# Patient Record
Sex: Female | Born: 1940 | Race: White | Hispanic: No | Marital: Married | Smoking: Former smoker
Health system: Southern US, Community
[De-identification: ages and names within clinical notes are randomized; demographics above are authoritative.]

## PROBLEM LIST (undated history)

## (undated) DIAGNOSIS — I469 Cardiac arrest, cause unspecified: Secondary | ICD-10-CM

## (undated) DIAGNOSIS — I5022 Chronic systolic (congestive) heart failure: Secondary | ICD-10-CM

## (undated) DIAGNOSIS — D649 Anemia, unspecified: Secondary | ICD-10-CM

## (undated) DIAGNOSIS — I499 Cardiac arrhythmia, unspecified: Secondary | ICD-10-CM

## (undated) DIAGNOSIS — I2089 Other forms of angina pectoris: Secondary | ICD-10-CM

## (undated) DIAGNOSIS — E119 Type 2 diabetes mellitus without complications: Secondary | ICD-10-CM

## (undated) DIAGNOSIS — E1142 Type 2 diabetes mellitus with diabetic polyneuropathy: Secondary | ICD-10-CM

## (undated) DIAGNOSIS — I208 Other forms of angina pectoris: Secondary | ICD-10-CM

## (undated) DIAGNOSIS — I2510 Atherosclerotic heart disease of native coronary artery without angina pectoris: Secondary | ICD-10-CM

## (undated) DIAGNOSIS — I10 Essential (primary) hypertension: Secondary | ICD-10-CM

## (undated) DIAGNOSIS — I219 Acute myocardial infarction, unspecified: Secondary | ICD-10-CM

## (undated) DIAGNOSIS — I739 Peripheral vascular disease, unspecified: Secondary | ICD-10-CM

## (undated) DIAGNOSIS — R413 Other amnesia: Secondary | ICD-10-CM

## (undated) DIAGNOSIS — M1990 Unspecified osteoarthritis, unspecified site: Secondary | ICD-10-CM

## (undated) DIAGNOSIS — K922 Gastrointestinal hemorrhage, unspecified: Secondary | ICD-10-CM

## (undated) DIAGNOSIS — J449 Chronic obstructive pulmonary disease, unspecified: Secondary | ICD-10-CM

## (undated) DIAGNOSIS — Z952 Presence of prosthetic heart valve: Secondary | ICD-10-CM

## (undated) DIAGNOSIS — M797 Fibromyalgia: Secondary | ICD-10-CM

## (undated) DIAGNOSIS — E7800 Pure hypercholesterolemia, unspecified: Secondary | ICD-10-CM

## (undated) HISTORY — PX: ANGIOPLASTY: SHX39

## (undated) HISTORY — PX: CORONARY ARTERY BYPASS GRAFT: SHX141

## (undated) HISTORY — DX: Unspecified osteoarthritis, unspecified site: M19.90

## (undated) HISTORY — DX: Other forms of angina pectoris: I20.89

## (undated) HISTORY — PX: CHOLECYSTECTOMY: SHX55

## (undated) HISTORY — PX: OTHER SURGICAL HISTORY: SHX169

## (undated) HISTORY — PX: AORTIC VALVE REPLACEMENT: SHX41

## (undated) HISTORY — DX: Type 2 diabetes mellitus with diabetic polyneuropathy: E11.42

## (undated) HISTORY — DX: Anemia, unspecified: D64.9

## (undated) HISTORY — DX: Cardiac arrhythmia, unspecified: I49.9

## (undated) HISTORY — DX: Fibromyalgia: M79.7

## (undated) HISTORY — DX: Pure hypercholesterolemia, unspecified: E78.00

## (undated) HISTORY — PX: TOENAIL AVULSION: SHX1074

## (undated) HISTORY — DX: Other amnesia: R41.3

## (undated) HISTORY — PX: BYPASS GRAFT: SHX909

## (undated) HISTORY — DX: Acute myocardial infarction, unspecified: I21.9

## (undated) HISTORY — DX: Peripheral vascular disease, unspecified: I73.9

## (undated) HISTORY — DX: Essential (primary) hypertension: I10

## (undated) HISTORY — DX: Other forms of angina pectoris: I20.8

---

## 1989-01-18 HISTORY — PX: CARDIAC VALVE REPLACEMENT: SHX585

## 1997-06-27 ENCOUNTER — Encounter (HOSPITAL_COMMUNITY): Admission: RE | Admit: 1997-06-27 | Discharge: 1997-09-25 | Payer: Self-pay | Admitting: Cardiology

## 1997-08-27 ENCOUNTER — Ambulatory Visit (HOSPITAL_BASED_OUTPATIENT_CLINIC_OR_DEPARTMENT_OTHER): Admission: RE | Admit: 1997-08-27 | Discharge: 1997-08-27 | Payer: Self-pay | Admitting: Orthopaedic Surgery

## 1997-10-18 ENCOUNTER — Encounter (HOSPITAL_COMMUNITY): Admission: RE | Admit: 1997-10-18 | Discharge: 1998-01-16 | Payer: Self-pay | Admitting: Cardiology

## 1997-11-18 ENCOUNTER — Encounter: Payer: Self-pay | Admitting: Emergency Medicine

## 1997-11-18 ENCOUNTER — Emergency Department (HOSPITAL_COMMUNITY): Admission: EM | Admit: 1997-11-18 | Discharge: 1997-11-18 | Payer: Self-pay | Admitting: Emergency Medicine

## 1998-01-01 ENCOUNTER — Encounter: Payer: Self-pay | Admitting: Internal Medicine

## 1998-01-01 ENCOUNTER — Ambulatory Visit (HOSPITAL_COMMUNITY): Admission: RE | Admit: 1998-01-01 | Discharge: 1998-01-01 | Payer: Self-pay | Admitting: Internal Medicine

## 1998-01-06 ENCOUNTER — Ambulatory Visit (HOSPITAL_COMMUNITY): Admission: RE | Admit: 1998-01-06 | Discharge: 1998-01-06 | Payer: Self-pay

## 1998-01-24 ENCOUNTER — Encounter (HOSPITAL_COMMUNITY): Admission: RE | Admit: 1998-01-24 | Discharge: 1998-04-24 | Payer: Self-pay | Admitting: Cardiology

## 1998-07-23 ENCOUNTER — Encounter (HOSPITAL_COMMUNITY): Admission: RE | Admit: 1998-07-23 | Discharge: 1998-08-18 | Payer: Self-pay | Admitting: Cardiology

## 1998-08-19 ENCOUNTER — Encounter (HOSPITAL_COMMUNITY): Admission: RE | Admit: 1998-08-19 | Discharge: 1998-11-17 | Payer: Self-pay | Admitting: Cardiology

## 1999-01-07 ENCOUNTER — Other Ambulatory Visit: Admission: RE | Admit: 1999-01-07 | Discharge: 1999-01-07 | Payer: Self-pay | Admitting: Obstetrics and Gynecology

## 1999-03-11 ENCOUNTER — Encounter: Payer: Self-pay | Admitting: Obstetrics and Gynecology

## 1999-03-11 ENCOUNTER — Encounter: Admission: RE | Admit: 1999-03-11 | Discharge: 1999-03-11 | Payer: Self-pay | Admitting: Obstetrics and Gynecology

## 1999-12-07 ENCOUNTER — Ambulatory Visit (HOSPITAL_COMMUNITY): Admission: RE | Admit: 1999-12-07 | Discharge: 1999-12-07 | Payer: Self-pay | Admitting: Obstetrics and Gynecology

## 1999-12-07 ENCOUNTER — Encounter: Payer: Self-pay | Admitting: Obstetrics and Gynecology

## 1999-12-15 ENCOUNTER — Other Ambulatory Visit: Admission: RE | Admit: 1999-12-15 | Discharge: 1999-12-15 | Payer: Self-pay | Admitting: Obstetrics and Gynecology

## 2000-12-12 ENCOUNTER — Encounter: Payer: Self-pay | Admitting: Obstetrics and Gynecology

## 2000-12-12 ENCOUNTER — Ambulatory Visit (HOSPITAL_COMMUNITY): Admission: RE | Admit: 2000-12-12 | Discharge: 2000-12-12 | Payer: Self-pay | Admitting: Obstetrics and Gynecology

## 2000-12-20 ENCOUNTER — Other Ambulatory Visit: Admission: RE | Admit: 2000-12-20 | Discharge: 2000-12-20 | Payer: Self-pay | Admitting: Obstetrics and Gynecology

## 2001-01-18 HISTORY — PX: OTHER SURGICAL HISTORY: SHX169

## 2001-07-16 ENCOUNTER — Inpatient Hospital Stay (HOSPITAL_COMMUNITY): Admission: RE | Admit: 2001-07-16 | Discharge: 2001-07-24 | Payer: Self-pay | Admitting: Vascular Surgery

## 2001-07-16 ENCOUNTER — Encounter: Payer: Self-pay | Admitting: Vascular Surgery

## 2001-07-18 ENCOUNTER — Encounter: Payer: Self-pay | Admitting: Vascular Surgery

## 2001-07-20 ENCOUNTER — Encounter: Payer: Self-pay | Admitting: Vascular Surgery

## 2001-12-21 ENCOUNTER — Encounter (INDEPENDENT_AMBULATORY_CARE_PROVIDER_SITE_OTHER): Payer: Self-pay | Admitting: Cardiology

## 2001-12-21 ENCOUNTER — Ambulatory Visit: Admission: RE | Admit: 2001-12-21 | Discharge: 2001-12-21 | Payer: Self-pay | Admitting: Cardiology

## 2002-02-06 ENCOUNTER — Other Ambulatory Visit: Admission: RE | Admit: 2002-02-06 | Discharge: 2002-02-06 | Payer: Self-pay | Admitting: Obstetrics and Gynecology

## 2002-02-08 ENCOUNTER — Ambulatory Visit (HOSPITAL_COMMUNITY): Admission: RE | Admit: 2002-02-08 | Discharge: 2002-02-08 | Payer: Self-pay | Admitting: Obstetrics and Gynecology

## 2002-02-08 ENCOUNTER — Encounter: Payer: Self-pay | Admitting: Obstetrics and Gynecology

## 2003-02-22 ENCOUNTER — Ambulatory Visit (HOSPITAL_COMMUNITY): Admission: RE | Admit: 2003-02-22 | Discharge: 2003-02-22 | Payer: Self-pay | Admitting: Internal Medicine

## 2003-02-28 ENCOUNTER — Other Ambulatory Visit: Admission: RE | Admit: 2003-02-28 | Discharge: 2003-02-28 | Payer: Self-pay | Admitting: Obstetrics and Gynecology

## 2004-02-24 ENCOUNTER — Ambulatory Visit (HOSPITAL_COMMUNITY): Admission: RE | Admit: 2004-02-24 | Discharge: 2004-02-24 | Payer: Self-pay | Admitting: Obstetrics and Gynecology

## 2004-04-28 ENCOUNTER — Inpatient Hospital Stay (HOSPITAL_COMMUNITY): Admission: AD | Admit: 2004-04-28 | Discharge: 2004-05-13 | Payer: Self-pay | Admitting: Cardiology

## 2004-06-17 ENCOUNTER — Encounter (HOSPITAL_COMMUNITY): Admission: RE | Admit: 2004-06-17 | Discharge: 2004-09-15 | Payer: Self-pay | Admitting: Cardiology

## 2004-06-17 ENCOUNTER — Encounter: Admission: RE | Admit: 2004-06-17 | Discharge: 2004-06-17 | Payer: Self-pay | Admitting: Cardiology

## 2005-01-12 ENCOUNTER — Ambulatory Visit: Admission: RE | Admit: 2005-01-12 | Discharge: 2005-01-12 | Payer: Self-pay | Admitting: Internal Medicine

## 2005-03-04 ENCOUNTER — Ambulatory Visit (HOSPITAL_COMMUNITY): Admission: RE | Admit: 2005-03-04 | Discharge: 2005-03-04 | Payer: Self-pay | Admitting: *Deleted

## 2005-03-08 ENCOUNTER — Other Ambulatory Visit: Admission: RE | Admit: 2005-03-08 | Discharge: 2005-03-08 | Payer: Self-pay | Admitting: Obstetrics & Gynecology

## 2005-04-07 ENCOUNTER — Ambulatory Visit (HOSPITAL_COMMUNITY): Admission: RE | Admit: 2005-04-07 | Discharge: 2005-04-07 | Payer: Self-pay | Admitting: Obstetrics & Gynecology

## 2006-02-24 ENCOUNTER — Encounter: Admission: RE | Admit: 2006-02-24 | Discharge: 2006-02-24 | Payer: Self-pay | Admitting: Cardiology

## 2006-03-02 ENCOUNTER — Ambulatory Visit (HOSPITAL_COMMUNITY): Admission: RE | Admit: 2006-03-02 | Discharge: 2006-03-02 | Payer: Self-pay | Admitting: Cardiology

## 2006-07-20 ENCOUNTER — Ambulatory Visit: Payer: Self-pay | Admitting: Vascular Surgery

## 2007-02-10 ENCOUNTER — Ambulatory Visit: Payer: Self-pay | Admitting: Vascular Surgery

## 2007-04-14 ENCOUNTER — Encounter (INDEPENDENT_AMBULATORY_CARE_PROVIDER_SITE_OTHER): Payer: Self-pay | Admitting: *Deleted

## 2007-04-14 ENCOUNTER — Ambulatory Visit (HOSPITAL_COMMUNITY): Admission: RE | Admit: 2007-04-14 | Discharge: 2007-04-14 | Payer: Self-pay | Admitting: *Deleted

## 2007-07-31 ENCOUNTER — Ambulatory Visit: Payer: Self-pay | Admitting: Vascular Surgery

## 2008-03-11 ENCOUNTER — Ambulatory Visit: Payer: Self-pay | Admitting: Vascular Surgery

## 2008-05-08 ENCOUNTER — Ambulatory Visit (HOSPITAL_COMMUNITY): Admission: RE | Admit: 2008-05-08 | Discharge: 2008-05-08 | Payer: Self-pay | Admitting: Obstetrics & Gynecology

## 2008-09-03 ENCOUNTER — Inpatient Hospital Stay (HOSPITAL_COMMUNITY): Admission: EM | Admit: 2008-09-03 | Discharge: 2008-09-07 | Payer: Self-pay | Admitting: Emergency Medicine

## 2008-09-06 ENCOUNTER — Encounter (INDEPENDENT_AMBULATORY_CARE_PROVIDER_SITE_OTHER): Payer: Self-pay | Admitting: Cardiology

## 2008-09-17 ENCOUNTER — Encounter: Admission: RE | Admit: 2008-09-17 | Discharge: 2008-09-17 | Payer: Self-pay | Admitting: Orthopaedic Surgery

## 2008-09-27 ENCOUNTER — Ambulatory Visit: Payer: Self-pay | Admitting: Vascular Surgery

## 2008-12-30 ENCOUNTER — Inpatient Hospital Stay (HOSPITAL_COMMUNITY): Admission: EM | Admit: 2008-12-30 | Discharge: 2009-01-03 | Payer: Self-pay | Admitting: Emergency Medicine

## 2008-12-31 ENCOUNTER — Encounter (INDEPENDENT_AMBULATORY_CARE_PROVIDER_SITE_OTHER): Payer: Self-pay | Admitting: Internal Medicine

## 2008-12-31 ENCOUNTER — Ambulatory Visit: Payer: Self-pay | Admitting: Vascular Surgery

## 2009-01-02 ENCOUNTER — Encounter (INDEPENDENT_AMBULATORY_CARE_PROVIDER_SITE_OTHER): Payer: Self-pay | Admitting: Internal Medicine

## 2009-05-20 ENCOUNTER — Ambulatory Visit: Payer: Self-pay | Admitting: Vascular Surgery

## 2009-12-17 ENCOUNTER — Encounter (INDEPENDENT_AMBULATORY_CARE_PROVIDER_SITE_OTHER): Payer: Self-pay | Admitting: Internal Medicine

## 2009-12-17 ENCOUNTER — Inpatient Hospital Stay (HOSPITAL_COMMUNITY)
Admission: EM | Admit: 2009-12-17 | Discharge: 2009-12-25 | Payer: Self-pay | Source: Home / Self Care | Attending: Internal Medicine | Admitting: Internal Medicine

## 2010-01-16 ENCOUNTER — Ambulatory Visit: Payer: Self-pay | Admitting: Vascular Surgery

## 2010-03-05 ENCOUNTER — Encounter (INDEPENDENT_AMBULATORY_CARE_PROVIDER_SITE_OTHER): Payer: Medicare Other

## 2010-03-05 DIAGNOSIS — Z48812 Encounter for surgical aftercare following surgery on the circulatory system: Secondary | ICD-10-CM

## 2010-03-05 DIAGNOSIS — I70229 Atherosclerosis of native arteries of extremities with rest pain, unspecified extremity: Secondary | ICD-10-CM

## 2010-03-17 NOTE — Procedures (Unsigned)
BYPASS GRAFT EVALUATION  INDICATION:  Right lower extremity bypass graft.  HISTORY: Diabetes:  Yes. Cardiac:  CABG. Hypertension:  Yes. Smoking:  Yes. Previous Surgery:  Right fem-pop bypass graft on 07/18/2001.  SINGLE LEVEL ARTERIAL EXAM                              RIGHT              LEFT Brachial:                    141                136 Anterior tibial:             164                83 Posterior tibial: Peroneal: Ankle/brachial index:        1.16               0.90  PREVIOUS ABI:  Date: 05/20/2009  RIGHT:  1.06  LEFT:  0.71  LOWER EXTREMITY BYPASS GRAFT DUPLEX EXAM:  DUPLEX:  Biphasic Doppler waveforms noted throughout the right lower extremity bypass graft with no increase in velocities.  IMPRESSION: 1. Patent right femoral-popliteal bypass graft with no evidence of     stenosis. 2. Bilateral ankle brachial indices and Doppler waveforms suggest     normal perfusion of the right lower extremity and mildly decreased     perfusion of the left lower extremity.       ___________________________________________ Larina Earthly, M.D.  CH/MEDQ  D:  03/06/2010  T:  03/06/2010  Job:  [id number]

## 2010-03-31 LAB — BASIC METABOLIC PANEL
BUN: 26 mg/dL — ABNORMAL HIGH (ref 6–23)
BUN: 30 mg/dL — ABNORMAL HIGH (ref 6–23)
BUN: 30 mg/dL — ABNORMAL HIGH (ref 6–23)
BUN: 34 mg/dL — ABNORMAL HIGH (ref 6–23)
BUN: 40 mg/dL — ABNORMAL HIGH (ref 6–23)
Calcium: 8.6 mg/dL (ref 8.4–10.5)
Calcium: 8.9 mg/dL (ref 8.4–10.5)
Calcium: 9.2 mg/dL (ref 8.4–10.5)
Chloride: 100 mEq/L (ref 96–112)
Chloride: 101 mEq/L (ref 96–112)
Creatinine, Ser: 0.85 mg/dL (ref 0.4–1.2)
Creatinine, Ser: 0.9 mg/dL (ref 0.4–1.2)
Creatinine, Ser: 0.95 mg/dL (ref 0.4–1.2)
Creatinine, Ser: 1.06 mg/dL (ref 0.4–1.2)
GFR calc Af Amer: >60 mL/min (ref >60)
GFR calc Af Amer: >60 mL/min (ref >60)
GFR calc Af Amer: >60 mL/min (ref >60)
GFR calc Af Amer: >60 mL/min (ref >60)
GFR calc non Af Amer: 52 mL/min — ABNORMAL LOW (ref >60)
GFR calc non Af Amer: 58 mL/min — ABNORMAL LOW (ref >60)
GFR calc non Af Amer: >60 mL/min (ref >60)
Glucose, Bld: 271 mg/dL — ABNORMAL HIGH (ref 70–99)
Potassium: 3.9 mEq/L (ref 3.5–5.1)

## 2010-03-31 LAB — CBC
HCT: 32.1 % — ABNORMAL LOW (ref 36.0–46.0)
HCT: 38 % (ref 36.0–46.0)
Hemoglobin: 10.1 g/dL — ABNORMAL LOW (ref 12.0–15.0)
MCH: 29.8 pg (ref 26.0–34.0)
MCH: 30.4 pg (ref 26.0–34.0)
MCH: 30.6 pg (ref 26.0–34.0)
MCHC: 31.5 g/dL (ref 30.0–36.0)
MCHC: 31.8 g/dL (ref 30.0–36.0)
MCHC: 32.8 g/dL (ref 30.0–36.0)
MCHC: 32.8 g/dL (ref 30.0–36.0)
MCV: 93.1 fL (ref 78.0–100.0)
MCV: 93.7 fL (ref 78.0–100.0)
MCV: 94.4 fL (ref 78.0–100.0)
MCV: 94.7 fL (ref 78.0–100.0)
Platelets: 249 10*3/uL (ref 150–400)
Platelets: 287 10*3/uL (ref 150–400)
Platelets: 298 10*3/uL (ref 150–400)
Platelets: 307 10*3/uL (ref 150–400)
Platelets: 335 10*3/uL (ref 150–400)
Platelets: 335 10*3/uL (ref 150–400)
RBC: 3.21 MIL/uL — ABNORMAL LOW (ref 3.87–5.11)
RBC: 3.45 MIL/uL — ABNORMAL LOW (ref 3.87–5.11)
RDW: 12.7 % (ref 11.5–15.5)
RDW: 12.9 % (ref 11.5–15.5)
RDW: 13 % (ref 11.5–15.5)
RDW: 13.1 % (ref 11.5–15.5)
RDW: 13.1 % (ref 11.5–15.5)
RDW: 13.2 % (ref 11.5–15.5)
RDW: 13.4 % (ref 11.5–15.5)
RDW: 13.4 % (ref 11.5–15.5)
WBC: 6.6 10*3/uL (ref 4.0–10.5)
WBC: 6.6 10*3/uL (ref 4.0–10.5)
WBC: 6.6 10*3/uL (ref 4.0–10.5)
WBC: 7.6 10*3/uL (ref 4.0–10.5)
WBC: 9.7 10*3/uL (ref 4.0–10.5)

## 2010-03-31 LAB — GLUCOSE, CAPILLARY
Glucose-Capillary: 100 mg/dL — ABNORMAL HIGH (ref 70–99)
Glucose-Capillary: 100 mg/dL — ABNORMAL HIGH (ref 70–99)
Glucose-Capillary: 115 mg/dL — ABNORMAL HIGH (ref 70–99)
Glucose-Capillary: 122 mg/dL — ABNORMAL HIGH (ref 70–99)
Glucose-Capillary: 137 mg/dL — ABNORMAL HIGH (ref 70–99)
Glucose-Capillary: 161 mg/dL — ABNORMAL HIGH (ref 70–99)
Glucose-Capillary: 162 mg/dL — ABNORMAL HIGH (ref 70–99)
Glucose-Capillary: 169 mg/dL — ABNORMAL HIGH (ref 70–99)
Glucose-Capillary: 191 mg/dL — ABNORMAL HIGH (ref 70–99)
Glucose-Capillary: 195 mg/dL — ABNORMAL HIGH (ref 70–99)
Glucose-Capillary: 197 mg/dL — ABNORMAL HIGH (ref 70–99)
Glucose-Capillary: 200 mg/dL — ABNORMAL HIGH (ref 70–99)
Glucose-Capillary: 351 mg/dL — ABNORMAL HIGH (ref 70–99)
Glucose-Capillary: 78 mg/dL (ref 70–99)
Glucose-Capillary: 84 mg/dL (ref 70–99)

## 2010-03-31 LAB — HEPATIC FUNCTION PANEL
AST: 38 U/L — ABNORMAL HIGH (ref 0–37)
Bilirubin, Direct: 0.2 mg/dL (ref 0.0–0.3)
Indirect Bilirubin: 0.7 mg/dL (ref 0.3–0.9)
Total Bilirubin: 0.9 mg/dL (ref 0.3–1.2)

## 2010-03-31 LAB — HEPARIN LEVEL (UNFRACTIONATED)
Heparin Unfractionated: 0.16 IU/mL — ABNORMAL LOW (ref 0.30–0.70)
Heparin Unfractionated: 0.34 IU/mL (ref 0.30–0.70)
Heparin Unfractionated: 0.52 IU/mL (ref 0.30–0.70)
Heparin Unfractionated: 0.56 IU/mL (ref 0.30–0.70)
Heparin Unfractionated: <0.1 IU/mL — ABNORMAL LOW (ref 0.30–0.70)

## 2010-03-31 LAB — PROTIME-INR
INR: 1.03 (ref 0.00–1.49)
INR: 1.15 (ref 0.00–1.49)
INR: 1.21 (ref 0.00–1.49)
INR: 1.39 (ref 0.00–1.49)
INR: 1.87 — ABNORMAL HIGH (ref 0.00–1.49)
INR: 3.24 — ABNORMAL HIGH (ref 0.00–1.49)
INR: 3.6 — ABNORMAL HIGH (ref 0.00–1.49)
Prothrombin Time: 13.7 seconds (ref 11.6–15.2)
Prothrombin Time: 15.5 seconds — ABNORMAL HIGH (ref 11.6–15.2)
Prothrombin Time: 21.7 seconds — ABNORMAL HIGH (ref 11.6–15.2)
Prothrombin Time: 33.1 seconds — ABNORMAL HIGH (ref 11.6–15.2)
Prothrombin Time: 35.9 seconds — ABNORMAL HIGH (ref 11.6–15.2)

## 2010-03-31 LAB — DIFFERENTIAL
Basophils Absolute: 0 10*3/uL (ref 0.0–0.1)
Eosinophils Absolute: 0.2 10*3/uL (ref 0.0–0.7)
Eosinophils Relative: 2 % (ref 0–5)
Lymphocytes Relative: 23 % (ref 12–46)
Lymphs Abs: 2.2 10*3/uL (ref 0.7–4.0)
Neutrophils Relative %: 67 % (ref 43–77)

## 2010-03-31 LAB — BRAIN NATRIURETIC PEPTIDE
Pro B Natriuretic peptide (BNP): 1362 pg/mL — ABNORMAL HIGH (ref 0.0–100.0)
Pro B Natriuretic peptide (BNP): 600 pg/mL — ABNORMAL HIGH (ref 0.0–100.0)
Pro B Natriuretic peptide (BNP): 648 pg/mL — ABNORMAL HIGH (ref 0.0–100.0)

## 2010-03-31 LAB — HEMOCCULT GUIAC POC 1CARD (OFFICE): Fecal Occult Bld: NEGATIVE

## 2010-03-31 LAB — TSH: TSH: 0.71 u[IU]/mL (ref 0.350–4.500)

## 2010-04-01 LAB — DIFFERENTIAL
Basophils Relative: 0 % (ref 0–1)
Eosinophils Absolute: 0.2 10*3/uL (ref 0.0–0.7)
Eosinophils Relative: 1 % (ref 0–5)
Lymphocytes Relative: 9 % — ABNORMAL LOW (ref 12–46)
Neutrophils Relative %: 85 % — ABNORMAL HIGH (ref 43–77)

## 2010-04-01 LAB — CK TOTAL AND CKMB (NOT AT ARMC): Total CK: 337 U/L — ABNORMAL HIGH (ref 7–177)

## 2010-04-01 LAB — GLUCOSE, CAPILLARY
Glucose-Capillary: 248 mg/dL — ABNORMAL HIGH (ref 70–99)
Glucose-Capillary: 260 mg/dL — ABNORMAL HIGH (ref 70–99)

## 2010-04-01 LAB — URINALYSIS, ROUTINE W REFLEX MICROSCOPIC
Bilirubin Urine: NEGATIVE
Ketones, ur: NEGATIVE mg/dL
Nitrite: NEGATIVE
Urobilinogen, UA: 0.2 mg/dL (ref 0.0–1.0)

## 2010-04-01 LAB — CULTURE, BLOOD (ROUTINE X 2)
Culture  Setup Time: 201112010057
Culture  Setup Time: 201112010058

## 2010-04-01 LAB — BASIC METABOLIC PANEL
Calcium: 9 mg/dL (ref 8.4–10.5)
Creatinine, Ser: 1 mg/dL (ref 0.4–1.2)
GFR calc Af Amer: >60 mL/min (ref >60)
GFR calc non Af Amer: 55 mL/min — ABNORMAL LOW (ref >60)
Sodium: 141 mEq/L (ref 135–145)

## 2010-04-01 LAB — CARDIAC PANEL(CRET KIN+CKTOT+MB+TROPI)
CK, MB: 5.5 ng/mL — ABNORMAL HIGH (ref 0.3–4.0)
CK, MB: 7.6 ng/mL (ref 0.3–4.0)
Relative Index: 2.1 (ref 0.0–2.5)
Total CK: 264 U/L — ABNORMAL HIGH (ref 7–177)
Troponin I: 1.36 ng/mL (ref 0.00–0.06)
Troponin I: 1.6 ng/mL (ref 0.00–0.06)

## 2010-04-01 LAB — POCT CARDIAC MARKERS: Myoglobin, poc: 57.3 ng/mL (ref 12–200)

## 2010-04-01 LAB — LIPID PANEL
Cholesterol: 131 mg/dL (ref 0–200)
LDL Cholesterol: 64 mg/dL (ref 0–99)
Triglycerides: 136 mg/dL (ref <150)
VLDL: 27 mg/dL (ref 0–40)

## 2010-04-01 LAB — HEMOGLOBIN A1C
Hgb A1c MFr Bld: 6.3 % — ABNORMAL HIGH (ref <5.7)
Hgb A1c MFr Bld: 6.4 % — ABNORMAL HIGH (ref <5.7)
Mean Plasma Glucose: 137 mg/dL — ABNORMAL HIGH (ref <117)

## 2010-04-01 LAB — CBC
Hemoglobin: 11.6 g/dL — ABNORMAL LOW (ref 12.0–15.0)
MCHC: 31 g/dL (ref 30.0–36.0)
Platelets: 346 10*3/uL (ref 150–400)
RBC: 3.84 MIL/uL — ABNORMAL LOW (ref 3.87–5.11)

## 2010-04-01 LAB — MRSA PCR SCREENING: MRSA by PCR: NEGATIVE

## 2010-04-01 LAB — APTT: aPTT: 37 seconds (ref 24–37)

## 2010-04-20 LAB — CBC
HCT: 35.7 % — ABNORMAL LOW (ref 36.0–46.0)
Hemoglobin: 11.8 g/dL — ABNORMAL LOW (ref 12.0–15.0)
MCHC: 32.9 g/dL (ref 30.0–36.0)
Platelets: 311 10*3/uL (ref 150–400)
RDW: 13.1 % (ref 11.5–15.5)

## 2010-04-20 LAB — GLUCOSE, CAPILLARY
Glucose-Capillary: 117 mg/dL — ABNORMAL HIGH (ref 70–99)
Glucose-Capillary: 124 mg/dL — ABNORMAL HIGH (ref 70–99)
Glucose-Capillary: 138 mg/dL — ABNORMAL HIGH (ref 70–99)
Glucose-Capillary: 161 mg/dL — ABNORMAL HIGH (ref 70–99)

## 2010-04-20 LAB — SEDIMENTATION RATE: Sed Rate: 9 mm/hr (ref 0–22)

## 2010-04-20 LAB — PROTIME-INR: INR: 1.92 — ABNORMAL HIGH (ref 0.00–1.49)

## 2010-04-21 LAB — GLUCOSE, CAPILLARY
Glucose-Capillary: 117 mg/dL — ABNORMAL HIGH (ref 70–99)
Glucose-Capillary: 131 mg/dL — ABNORMAL HIGH (ref 70–99)
Glucose-Capillary: 134 mg/dL — ABNORMAL HIGH (ref 70–99)
Glucose-Capillary: 168 mg/dL — ABNORMAL HIGH (ref 70–99)
Glucose-Capillary: 199 mg/dL — ABNORMAL HIGH (ref 70–99)
Glucose-Capillary: 82 mg/dL (ref 70–99)
Glucose-Capillary: 97 mg/dL (ref 70–99)

## 2010-04-21 LAB — BASIC METABOLIC PANEL
BUN: 17 mg/dL (ref 6–23)
Calcium: 9 mg/dL (ref 8.4–10.5)
Creatinine, Ser: 0.83 mg/dL (ref 0.4–1.2)
GFR calc non Af Amer: >60 mL/min (ref >60)
Glucose, Bld: 136 mg/dL — ABNORMAL HIGH (ref 70–99)

## 2010-04-21 LAB — LIPID PANEL
Cholesterol: 132 mg/dL (ref 0–200)
LDL Cholesterol: 69 mg/dL (ref 0–99)
Total CHOL/HDL Ratio: 3 RATIO
Triglycerides: 94 mg/dL (ref <150)
VLDL: 19 mg/dL (ref 0–40)

## 2010-04-21 LAB — COMPREHENSIVE METABOLIC PANEL
Albumin: 3.8 g/dL (ref 3.5–5.2)
Alkaline Phosphatase: 24 U/L — ABNORMAL LOW (ref 39–117)
BUN: 20 mg/dL (ref 6–23)
Creatinine, Ser: 0.88 mg/dL (ref 0.4–1.2)
Glucose, Bld: 89 mg/dL (ref 70–99)
Potassium: 3.6 mEq/L (ref 3.5–5.1)
Total Protein: 7 g/dL (ref 6.0–8.3)

## 2010-04-21 LAB — CBC
HCT: 34.4 % — ABNORMAL LOW (ref 36.0–46.0)
Hemoglobin: 10.4 g/dL — ABNORMAL LOW (ref 12.0–15.0)
Hemoglobin: 11.4 g/dL — ABNORMAL LOW (ref 12.0–15.0)
MCHC: 33 g/dL (ref 30.0–36.0)
MCHC: 33.3 g/dL (ref 30.0–36.0)
MCV: 94.7 fL (ref 78.0–100.0)
Platelets: 312 10*3/uL (ref 150–400)
RBC: 3.35 MIL/uL — ABNORMAL LOW (ref 3.87–5.11)
RDW: 12.9 % (ref 11.5–15.5)
WBC: 6.2 10*3/uL (ref 4.0–10.5)

## 2010-04-21 LAB — URINALYSIS, ROUTINE W REFLEX MICROSCOPIC
Glucose, UA: NEGATIVE mg/dL
Hgb urine dipstick: NEGATIVE
Protein, ur: NEGATIVE mg/dL
Specific Gravity, Urine: 1.015 (ref 1.005–1.030)
pH: 5 (ref 5.0–8.0)

## 2010-04-21 LAB — CARDIAC PANEL(CRET KIN+CKTOT+MB+TROPI)
CK, MB: 1.3 ng/mL (ref 0.3–4.0)
CK, MB: 1.3 ng/mL (ref 0.3–4.0)
Relative Index: INVALID (ref 0.0–2.5)
Total CK: 81 U/L (ref 7–177)
Total CK: 96 U/L (ref 7–177)
Troponin I: 0.02 ng/mL (ref 0.00–0.06)
Troponin I: 0.02 ng/mL (ref 0.00–0.06)

## 2010-04-21 LAB — URINE MICROSCOPIC-ADD ON

## 2010-04-21 LAB — HEMOGLOBIN A1C
Hgb A1c MFr Bld: 5.6 % (ref 4.6–6.1)
Mean Plasma Glucose: 114 mg/dL

## 2010-04-21 LAB — DIFFERENTIAL
Basophils Absolute: 0 10*3/uL (ref 0.0–0.1)
Basophils Relative: 0 % (ref 0–1)
Lymphocytes Relative: 18 % (ref 12–46)
Monocytes Relative: 6 % (ref 3–12)
Neutro Abs: 5.6 10*3/uL (ref 1.7–7.7)
Neutrophils Relative %: 73 % (ref 43–77)

## 2010-04-21 LAB — RETICULOCYTES
RBC.: 3.51 MIL/uL — ABNORMAL LOW (ref 3.87–5.11)
Retic Count, Absolute: 59.7 10*3/uL (ref 19.0–186.0)
Retic Ct Pct: 1.7 % (ref 0.4–3.1)

## 2010-04-21 LAB — CK TOTAL AND CKMB (NOT AT ARMC)
Relative Index: 1.4 (ref 0.0–2.5)
Total CK: 130 U/L (ref 7–177)

## 2010-04-21 LAB — PROTIME-INR
INR: 2.64 — ABNORMAL HIGH (ref 0.00–1.49)
INR: 3 — ABNORMAL HIGH (ref 0.00–1.49)
Prothrombin Time: 21.5 seconds — ABNORMAL HIGH (ref 11.6–15.2)
Prothrombin Time: 28 seconds — ABNORMAL HIGH (ref 11.6–15.2)

## 2010-04-21 LAB — URINE CULTURE

## 2010-04-21 LAB — HOMOCYSTEINE: Homocysteine: 13.8 umol/L (ref 4.0–15.4)

## 2010-04-21 LAB — APTT: aPTT: 48 seconds — ABNORMAL HIGH (ref 24–37)

## 2010-04-21 LAB — TROPONIN I: Troponin I: 0.03 ng/mL (ref 0.00–0.06)

## 2010-04-21 LAB — VITAMIN B12: Vitamin B-12: 722 pg/mL (ref 211–911)

## 2010-04-25 LAB — GLUCOSE, CAPILLARY
Glucose-Capillary: 115 mg/dL — ABNORMAL HIGH (ref 70–99)
Glucose-Capillary: 135 mg/dL — ABNORMAL HIGH (ref 70–99)
Glucose-Capillary: 147 mg/dL — ABNORMAL HIGH (ref 70–99)
Glucose-Capillary: 147 mg/dL — ABNORMAL HIGH (ref 70–99)
Glucose-Capillary: 151 mg/dL — ABNORMAL HIGH (ref 70–99)
Glucose-Capillary: 163 mg/dL — ABNORMAL HIGH (ref 70–99)
Glucose-Capillary: 202 mg/dL — ABNORMAL HIGH (ref 70–99)
Glucose-Capillary: 252 mg/dL — ABNORMAL HIGH (ref 70–99)

## 2010-04-25 LAB — URINALYSIS, ROUTINE W REFLEX MICROSCOPIC
Bilirubin Urine: NEGATIVE
Ketones, ur: 15 mg/dL — AB
Nitrite: NEGATIVE
Protein, ur: NEGATIVE mg/dL
pH: 6 (ref 5.0–8.0)

## 2010-04-25 LAB — PROTIME-INR
INR: 1.4 (ref 0.00–1.49)
INR: 2.2 — ABNORMAL HIGH (ref 0.00–1.49)
INR: 2.3 — ABNORMAL HIGH (ref 0.00–1.49)
Prothrombin Time: 17.4 seconds — ABNORMAL HIGH (ref 11.6–15.2)
Prothrombin Time: 26.8 seconds — ABNORMAL HIGH (ref 11.6–15.2)

## 2010-04-25 LAB — CARDIAC PANEL(CRET KIN+CKTOT+MB+TROPI)
CK, MB: 21.1 ng/mL — ABNORMAL HIGH (ref 0.3–4.0)
CK, MB: 74.4 ng/mL — ABNORMAL HIGH (ref 0.3–4.0)
Relative Index: 4.5 — ABNORMAL HIGH (ref 0.0–2.5)
Total CK: 919 U/L — ABNORMAL HIGH (ref 7–177)
Troponin I: 12.28 ng/mL (ref 0.00–0.06)

## 2010-04-25 LAB — COMPREHENSIVE METABOLIC PANEL
ALT: 32 U/L (ref 0–35)
AST: 76 U/L — ABNORMAL HIGH (ref 0–37)
Albumin: 3.9 g/dL (ref 3.5–5.2)
Alkaline Phosphatase: 31 U/L — ABNORMAL LOW (ref 39–117)
CO2: 25 mEq/L (ref 19–32)
Chloride: 99 mEq/L (ref 96–112)
GFR calc Af Amer: >60 mL/min (ref >60)
Potassium: 4.7 mEq/L (ref 3.5–5.1)
Sodium: 134 mEq/L — ABNORMAL LOW (ref 135–145)
Total Bilirubin: 0.9 mg/dL (ref 0.3–1.2)

## 2010-04-25 LAB — BASIC METABOLIC PANEL
BUN: 19 mg/dL (ref 6–23)
BUN: 20 mg/dL (ref 6–23)
BUN: 22 mg/dL (ref 6–23)
CO2: 29 mEq/L (ref 19–32)
CO2: 31 mEq/L (ref 19–32)
Calcium: 8.5 mg/dL (ref 8.4–10.5)
Calcium: 8.5 mg/dL (ref 8.4–10.5)
Calcium: 8.8 mg/dL (ref 8.4–10.5)
Chloride: 105 mEq/L (ref 96–112)
Creatinine, Ser: 0.85 mg/dL (ref 0.4–1.2)
Creatinine, Ser: 0.85 mg/dL (ref 0.4–1.2)
Creatinine, Ser: 0.88 mg/dL (ref 0.4–1.2)
GFR calc Af Amer: >60 mL/min (ref >60)
GFR calc Af Amer: >60 mL/min (ref >60)
GFR calc Af Amer: >60 mL/min (ref >60)
GFR calc non Af Amer: >60 mL/min (ref >60)
GFR calc non Af Amer: >60 mL/min (ref >60)
Glucose, Bld: 140 mg/dL — ABNORMAL HIGH (ref 70–99)
Potassium: 3.9 mEq/L (ref 3.5–5.1)
Sodium: 142 mEq/L (ref 135–145)

## 2010-04-25 LAB — MAGNESIUM: Magnesium: 1.9 mg/dL (ref 1.5–2.5)

## 2010-04-25 LAB — LIPID PANEL
LDL Cholesterol: 200 mg/dL — ABNORMAL HIGH (ref 0–99)
LDL Cholesterol: 244 mg/dL — ABNORMAL HIGH (ref 0–99)
Triglycerides: 287 mg/dL — ABNORMAL HIGH (ref <150)
VLDL: 38 mg/dL (ref 0–40)

## 2010-04-25 LAB — CBC
HCT: 36.9 % (ref 36.0–46.0)
MCHC: 34 g/dL (ref 30.0–36.0)
Platelets: 304 10*3/uL (ref 150–400)
Platelets: 305 10*3/uL (ref 150–400)
Platelets: 308 10*3/uL (ref 150–400)
Platelets: 359 10*3/uL (ref 150–400)
RBC: 3.44 MIL/uL — ABNORMAL LOW (ref 3.87–5.11)
RBC: 3.94 MIL/uL (ref 3.87–5.11)
RDW: 13 % (ref 11.5–15.5)
RDW: 13.1 % (ref 11.5–15.5)
RDW: 13.1 % (ref 11.5–15.5)
WBC: 7.4 10*3/uL (ref 4.0–10.5)
WBC: 9 10*3/uL (ref 4.0–10.5)

## 2010-04-25 LAB — DIFFERENTIAL
Basophils Absolute: 0 10*3/uL (ref 0.0–0.1)
Basophils Relative: 0 % (ref 0–1)
Eosinophils Absolute: 0 10*3/uL (ref 0.0–0.7)
Eosinophils Relative: 0 % (ref 0–5)
Lymphocytes Relative: 10 % — ABNORMAL LOW (ref 12–46)
Monocytes Absolute: 0.1 10*3/uL (ref 0.1–1.0)

## 2010-04-25 LAB — CK TOTAL AND CKMB (NOT AT ARMC)
CK, MB: 52.4 ng/mL — ABNORMAL HIGH (ref 0.3–4.0)
Total CK: 665 U/L — ABNORMAL HIGH (ref 7–177)

## 2010-04-25 LAB — TROPONIN I: Troponin I: 9.32 ng/mL (ref 0.00–0.06)

## 2010-04-25 LAB — HEPARIN LEVEL (UNFRACTIONATED)
Heparin Unfractionated: 0.24 IU/mL — ABNORMAL LOW (ref 0.30–0.70)
Heparin Unfractionated: 0.27 IU/mL — ABNORMAL LOW (ref 0.30–0.70)
Heparin Unfractionated: 0.41 IU/mL (ref 0.30–0.70)
Heparin Unfractionated: 1.66 IU/mL — ABNORMAL HIGH (ref 0.30–0.70)

## 2010-04-25 LAB — APTT: aPTT: >200 seconds (ref 24–37)

## 2010-04-25 LAB — URINE MICROSCOPIC-ADD ON

## 2010-04-25 LAB — POCT CARDIAC MARKERS: Troponin i, poc: 3.03 ng/mL (ref 0.00–0.09)

## 2010-04-25 LAB — TSH: TSH: 0.474 u[IU]/mL (ref 0.350–4.500)

## 2010-06-02 NOTE — Procedures (Signed)
BYPASS GRAFT EVALUATION   INDICATION:  Follow-up evaluation of right lower extremity bypass graft.  Patient has no new complaints at this time.   HISTORY:  Diabetes:  Yes.  Cardiac:  Redo CABG on 05/06/04 by Dr. Tyrone Sage; AVR in 1991.  Hypertension:  Yes.  Smoking:  No.  Previous Surgery:  Right fem-pop BPG with translocated saphenous vein on  07/18/01 by Dr. Arbie Cookey, cardiac.   SINGLE LEVEL ARTERIAL EXAM                               RIGHT              LEFT  Brachial:                    160                156  Anterior tibial:             170                128  Posterior tibial:  Peroneal:                    160                117  Ankle/brachial index:        >1.0               0.80   PREVIOUS ABI:  Date: 02/10/07  RIGHT:  0.96  LEFT:  Calcified   LOWER EXTREMITY BYPASS GRAFT DUPLEX EXAM:   DUPLEX:  Doppler arterial waveforms are triphasic to, throughout, and  distal to the graft with elevated velocities noted at the proximal and  distal anastomoses, which are stable from previous exams.   IMPRESSION:  1. Stable right ankle brachial index.  2. Left ankle brachial index is consistent with mild-to-moderate      arterial compromise, which is stable from study performed on      07/20/06.  3. Patent right femoropopliteal bypass graft.   ___________________________________________  Larina Earthly, M.D.   PB/MEDQ  D:  07/31/2007  T:  07/31/2007  Job:  [id number]

## 2010-06-02 NOTE — Cardiovascular Report (Signed)
NAMEFRANCILLE, [name] NO.:  [id number]   MEDICAL RECORD NO.:  [id number]          PATIENT TYPE:  INP   LOCATION:  2905                         FACILITY:  [facility]   PHYSICIAN:  Nanetta Batty, M.D.   DATE OF BIRTH:  1940/08/02   DATE OF PROCEDURE:  09/04/2008  DATE OF DISCHARGE:                            CARDIAC CATHETERIZATION   PROCEDURE:  Cardiac catheterization.   SURGEON:  Nanetta Batty, MD   INDICATIONS FOR PROCEDURE:  Ms. Hord is a 70 year old Caucasian  female patient of Dr. Fredirick Maudlin admitted on September 03, 2008 with  substernal chest pain.  She has a history of coronary artery bypass  grafting in 1991 with St. Jude AVR and redo bypass grafting in 2006 with  the vein to the PDA, diag, and OM.  She has had right fem-pop bypass  grafting in the past.  Other problems include diabetes, hypertension,  and hyperlipidemia.  She was admitted with a non-ST fibrillation  myocardial infarction with the troponin in the 10 range and lateral ST-  segment depression.  Her INR drifted down to 1.7 and she was seen by Dr.  Clarene Duke.  She presents now for diagnostic coronary arteriography to  define her anatomy and rule out ischemic etiology.   DESCRIPTION OF PROCEDURE:  The patient was brought to the Second Floor  [facility] in the postabsorptive state.  She was  premedicated with p.o. Valium, IV Versed, and fentanyl.  Her left groin  was prepped and shaved in the usual sterile fashion.  Xylocaine 1% was  used for local anesthesia.  A 5-French sheath was inserted into the  right femoral artery using standard Seldinger technique.  A 5-French  right-left Judkins diagnostic catheter as well as 5-French bypass graft  catheter were used for selective coronary angiography, selective vein  graft angiography, and selective IMA angiography.  Left ventriculography  was not performed because of the patient having a mechanical St. Jude  AVR.  Visipaque dye was used  for the entirety of the case.  Retrograde  aortic pressures were monitored during the case.   HEMODYNAMIC RESULTS:  Aortic systolic pressure 185, diastolic pressure  58.   SELECTIVE CORONARY ANGIOGRAPHY:  1. Left main; 80% eccentric calcific stenosis.  2. LAD; 50% segmental proximal LAD followed by 60-70% stenosis in      segmental LAD after the first diagonal branch and septal perforator      after which it was occluded.  The diagonal branch was a moderate      size vessel with 80-90% long segmental proximal stenosis.  There      was an occluded stump to vein graft noted.  3. Left circumflex; 40-50% proximal and mid.  There was a marginal      branch that arose in the mid third that was moderate in size and      had 60% ostial stenosis.  The circumflex was occluded after this in      the AV groove.  4. Right coronary artery; dominant with 70% proximal, segmental 50-60%      mid, and 80% distal.  There was competitive flow distally.  5. Vein graft to the PDA widely patent.  There was a segmental 50%      stenosis in the mid PDA straddling the vein graft insertion.  6. Vein graft to the distal circumflex widely patent with a smooth 30%      aorto-ostial/proximal stenosis.  7. LIMA to LAD, widely patent.  8. Vein graft to the diag, occluded at the aorta which is an old      finding.   IMPRESSION:  Ms. Drone has an anatomy unchanged from her prior  catheterization, February 2008.  At that time, the diagonal graft was  occluded.  I am not sure of the etiology of her chest pain or positive  enzymes.  Continued medical therapy will be recommended.  She will be re-  heparinized in 6 hours and Pharmacy will dose Coumadin beginning tonight  because of her aortic valve replacement with an INR target of  approximately 2.5.   Sheath was removed and pressure was held on the groin to achieve  hemostasis.  The patient left the lab in stable condition.  Dr. Clarene Duke  was notified of these  results.      Nanetta Batty, M.D.  Electronically Signed     JB/MEDQ  D:  09/04/2008  T:  09/05/2008  Job:  [id number]   cc:   Second [facility]  [facility]  Gaspar Garbe B. Little, M.D.

## 2010-06-02 NOTE — Procedures (Signed)
BYPASS GRAFT EVALUATION   INDICATION:  Followup, right lower extremity bypass graft.   HISTORY:  Diabetes:  Yes.  Cardiac:  CABG, AVR.  Hypertension:  Yes.  Smoking:  No.  Previous Surgery:  Right fem-pop bypass graft on 07/18/01.   SINGLE LEVEL ARTERIAL EXAM                               RIGHT              LEFT  Brachial:                    154                151  Anterior tibial:             185                117  Posterior tibial:            158                53  Peroneal:  Ankle/brachial index:        1.2                0.76   PREVIOUS ABI:  Date: 07/31/07  RIGHT:  >1.0  LEFT:  0.8   LOWER EXTREMITY BYPASS GRAFT DUPLEX EXAM:   DUPLEX:  Biphasic Doppler waveforms noted throughout the right lower  extremity bypass graft and its native vessels with an increased velocity  of 183 cm/s noted at the distal native vessel level.  This increase in  velocity is most likely due to the angle of anastomosis take-off along  with change in vessel diameter.   IMPRESSION:  1. Patent right lower extremity bypass graft with an increased      velocity noted, as described above.  2. Stable bilateral ankle brachial indices noted.   ___________________________________________  Larina Earthly, M.D.   CH/MEDQ  D:  03/11/2008  T:  03/11/2008  Job:  [id number]

## 2010-06-02 NOTE — Procedures (Signed)
BYPASS GRAFT EVALUATION   INDICATION:  Follow-up evaluation of lower extremity bypass graft.   HISTORY:  Diabetes:  Yes  Cardiac:  Redo coronary artery bypass graft on May 06, 2004 by Dr.  Tyrone Sage; aortic valve replacement in 1991  Hypertension:  Yes  Smoking:  No  Previous Surgery:  Right fem-pop bypass graft with translocated  saphenous vein by Dr. Arbie Cookey on July 18, 2001   SINGLE LEVEL ARTERIAL EXAM                               RIGHT              LEFT  Brachial:                    185                171  Anterior tibial:             189                146  Posterior tibial:            139  Peroneal:                    171                132  Ankle/brachial index:        >1                 0.79   PREVIOUS ABI:  Date: March 15, 2005  RIGHT:  >1  LEFT:  Not  obtained/calcified   LOWER EXTREMITY BYPASS GRAFT DUPLEX EXAM:   DUPLEX:  Doppler wave forms are biphasic proximal to within and distal  to the right fem-pop bypass graft.   IMPRESSION:  1. The right ABI is stable from previous study.  2. Patent right fem-pop bypass graft.   ___________________________________________  Di Kindle. Edilia Bo, M.D.   MC/MEDQ  D:  07/20/2006  T:  07/21/2006  Job:  [id number]

## 2010-06-02 NOTE — Procedures (Signed)
BYPASS GRAFT EVALUATION   INDICATION:  Followup, right leg bypass graft.   HISTORY:  Diabetes:  Yes.  Cardiac:  Redo CABG on 05/06/04 by Dr. Tyrone Sage, AVR in 1991.  Hypertension:  Yes.  Smoking:  No.  Previous Surgery:  Right femoral-to-popliteal artery bypass graft with  translocated saphenous vein on 07/18/01 by Dr. Arbie Cookey.   SINGLE LEVEL ARTERIAL EXAM                               RIGHT              LEFT  Brachial:                    170                164  Anterior tibial:             164                124  Posterior tibial:            156                134  Peroneal:                                       154 (monophasic)  Ankle/brachial index:        0.96               Calcified   PREVIOUS ABI:  Date: 07/20/06  RIGHT:  >1.0  LEFT:  0.79   LOWER EXTREMITY BYPASS GRAFT DUPLEX EXAM:   DUPLEX:  Doppler arterial waveforms are triphasic proximal to,  throughout, and distal to the graft.  Velocities are elevated in the  proximal and distal anastomoses; however, they are stable from previous  exams.   IMPRESSION:  1. Patent right femoral-to-popliteal artery bypass graft.  2. Right ankle brachial index is stable.  3. Left ankle brachial index not calculated.  Appears falsely      elevated, as noted on previous examinations.   ___________________________________________  Larina Earthly, M.D.   DP/MEDQ  D:  02/10/2007  T:  02/10/2007  Job:  [id number]

## 2010-06-02 NOTE — Discharge Summary (Signed)
[name], [name]              ACCOUNT NO.:  [id number]   MEDICAL RECORD NO.:  [id number]          PATIENT TYPE:  INP   LOCATION:  2022                         FACILITY:  [facility]   PHYSICIAN:  Thereasa Solo. Little, M.D. DATE OF BIRTH:  1940-12-08   DATE OF ADMISSION:  09/03/2008  DATE OF DISCHARGE:  09/07/2008                               DISCHARGE SUMMARY   DISCHARGE DIAGNOSES:  1. Non-ST elevation myocardial infarction.  2. Abnormal EKG.  3. Coronary artery disease, though no change in coronary stenosis with      her cardiac catheterization.      a.     History of bypass grafting.  4. Aortic valve replacement with the St. Jude mechanical valves in the      past.  5. Peripheral arterial disease with fem-pop bypass.  6. Diabetes mellitus.  7. Anticoagulation.  8. Dyslipidemia.   DISCHARGE CONDITION:  Improved.   PROCEDURES:  On September 04, 2008, combined left heart catheterization  with graft visualization by Dr. Nanetta Batty.   DISCHARGE MEDICATIONS:  Please see computer-generated medication  discharge list.   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  Jude AVR in 1991 and then redo bypass in 2006 with a vein graft to her  PDA and a vein graft to her OM and a vein graft to her diagonal.  Last  cath in February 2008 when she had abnormal EKG.  All grafts were patent  except for the vein graft to the diagonal.  She was treated medically.   On September 02, 2008, this patient presented to the emergency room by EMS  after episode of chest pain at home, had substernal chest pressure that  was not particularly severe, but it did not go away.  She called the  EMS.  Blood pressure was 200 systolic.  She was given nitroglycerin and  transferred to [facility].  She was pain free at the time of exam, on IV  nitroglycerin.  Her troponin was positive at 3.0 and she did admit with  exam that she had not felt well for a couple of weeks with increasing  fatigue.   PAST MEDICAL HISTORY:  As described and see problem list and history and  physical.   ALLERGIES:  Intolerance to CODEINE, CRESTOR causes joint pain, and  SHELLFISH.   The patient was admitted, on enzymes troponin peaked at 28.6 and CK-MB  peaked at 785 with MB of 51.   She underwent cardiac catheterization with stable, though disease of  coronaries and loss of saphenous vein graft to the diagonal as before.  She was started back on her Coumadin and was on heparin Coumadin  crossover.  She was transferred to telemetry and was ambulated with  cardiac rehab and did well.  By September 06, 2008, her INR was 2.2, but  her EKG which progressively was at its worse on September 05, 2008, and  it  was slightly better on September 05, 2008, but continued with significant T-  wave inversions, deep T-waves in lead I, aVL as well as in lead V4, V5,  and V6 as well as some in V2 and V3.  Because of the abnormality, her  amitriptyline to see if prolonged QT may be playing a factor in this  abnormal EKG.   LABORATORY DATA:  Hemoglobin on admission 12.4, hematocrit 36.2, INR was  3.1.  Prior to discharge, hemoglobin 10.8, hematocrit 31.1, WBC 9, and  platelets 308.  Protime 24.6, INR of 2.2, maybe higher at day of  discharge.  Sodium at discharge 139, potassium 3.3 and that will be  replaced.  Chloride 104, CO2 of 28, glucose 140, BUN 22, creatinine  0.88, and calcium 8.5.  Cardiac markers, the peak CK-MB was 919 with an  MB of 74 and troponin of 28.6, otherwise as stated previously.  Lipid  panel, total cholesterol 326, triglycerides 189, HDL 44, LDL was 244.  UA, urine glucose was greater than 1000.  Please note the patient is on  TriCor as well as Lovaza and Crestor, though she was not on Crestor as  an outpatient.   RADIOLOGY:  Chest x-ray on  admission, peribronchial cuffing and central  bronchovascular thickening, may represent bronchitis, bibasilar  atelectasis versus likely infiltrates.   VITAL SIGNS PRIOR TO DISCHARGE:  Blood pressure 137/52, pulse 50-60,  respiratory rate 18, temp 96.5, and oxygen saturation 97%.   Medications were adjusted on September 06, 2008, and will be reevaluated  prior to discharge on September 07, 2008.   Also, a 2-D echo was done on September 06, 2008, had not yet been read and  will be evaluated prior to discharge.  EKG will also be reevaluated  prior to discharge.      Darcella Gasman. Ingold, N.P.    ______________________________  Thereasa Solo Little, M.D.    LRI/MEDQ  D:  09/06/2008  T:  09/07/2008  Job:  [id number]

## 2010-06-02 NOTE — Op Note (Signed)
[name], [name]              ACCOUNT NO.:  [id number]   MEDICAL RECORD NO.:  [id number]          PATIENT TYPE:  AMB   LOCATION:  ENDO                         FACILITY:  [facility]   PHYSICIAN:  Georgiana Spinner, M.D.    DATE OF BIRTH:  April 28, 1940   DATE OF PROCEDURE:  04/14/2007  DATE OF DISCHARGE:                               OPERATIVE REPORT   PROCEDURE:  Upper endoscopy.   INDICATIONS:  Diarrhea, abdominal bloating.   ANESTHESIA:  Fentanyl 75 mcg, 8 mg of Versed, ampicillin 2 grams was  given prior to the procedure along with 80 mg of gentamicin.   PROCEDURE IN DETAIL:  With the patient mildly sedated in the left  lateral decubitus position the Pentax videoscopic endoscope was inserted  in the mouth and passed easily under direct vision into the esophagus  which appeared normal into the stomach.  Fundus, body, antrum, duodenal  bulb, second portion of duodenum were visualized as far distally as I  could  go.  Biopsy was taken from a normal appearing small bowel mucosa.  From this point the endoscope was slowly withdrawn taking  circumferential views of duodenal mucosa until the endoscope had been  pulled back into the stomach placed in retroflexion to view the stomach  from below.  The endoscope was straightened and biopsies were taken of  erythematous antral changes where there had been some hemorrhagic  gastritis, photographed and biopsied.  We then withdrew the endoscope  taking circumferential views of remaining gastric and esophageal mucosa.  The patient's vital signs and pulse oximeter remained stable.  The  patient tolerated the procedure well without apparent complications.   FINDINGS:  Hemorrhagic gastritis photographed and biopsied in the antrum  and biopsy of the small bowel was taken.  Await biopsy reports.  The  patient will call me for results and follow up with me as an outpatient.  Proceed to colonoscopy.           ______________________________  Georgiana Spinner, M.D.     GMO/MEDQ  D:  04/14/2007  T:  04/15/2007  Job:  [id number]

## 2010-06-02 NOTE — Procedures (Signed)
BYPASS GRAFT EVALUATION   INDICATION:  Follow up right femoral to popliteal bypass graft.   HISTORY:  Diabetes:  Yes  Cardiac:  CABG, AR  Hypertension:  Yes  Smoking:  Yes  Previous Surgery:  Right femoral to popliteal bypass graft 07/18/2001 by  Dr. Arbie Cookey.   SINGLE LEVEL ARTERIAL EXAM                               RIGHT              LEFT  Brachial:                    161                156  Anterior tibial:             170                109  Posterior tibial:            150                115  Peroneal:  Ankle/brachial index:        1.06               0.71   PREVIOUS ABI:  Date: 09/27/2008  RIGHT:  1.16  LEFT:  0.77   LOWER EXTREMITY BYPASS GRAFT DUPLEX EXAM:   DUPLEX:  Patent right femoral to popliteal bypass graft with biphasic  waveforms noted proximally, within and distal to the graft.  There are  elevated velocities of 168 cm/sec. at the distal anastomosis.   IMPRESSION:  1. Stable ankle-brachial indices bilaterally.  2. Patent right femoral to popliteal artery bypass graft with elevated      velocities as described above.   ___________________________________________  Larina Earthly, M.D.   CB/MEDQ  D:  05/21/2009  T:  05/21/2009  Job:  [id number]

## 2010-06-02 NOTE — H&P (Signed)
[name], [name]              ACCOUNT NO.:  [id number]   MEDICAL RECORD NO.:  [id number]          PATIENT TYPE:  INP   LOCATION:  1826                         FACILITY:  [facility]   PHYSICIAN:  Thereasa Solo. Little, M.D. DATE OF BIRTH:  June 21, 1940   DATE OF ADMISSION:  09/02/2008  DATE OF DISCHARGE:                              HISTORY & PHYSICAL   PRIORITY ADMISSION HISTORY AND PHYSICAL   CHIEF COMPLAINT:  Chest pain.   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  She has a history of coronary  disease.  Her initial bypass grafting was  x1 with an LIMA to LAD as  well as a St. Jude AVR in 1991.  She had redo bypass in 2006 with a vein  graft to her PDA, a vein graft to her OM, and a vein graft to her  diagonal.  She was last cathed in February of 2008, when she had an  abnormal EKG.  All grafts were patent except for the vein graft to the  diagonal.  She was treated medically.  She is followed in the office by  Dr. Clarene Duke.  She presented to the emergency room tonight via EMS after  an episode of chest pain at home.  She said she had substernal chest  pressure that was not particularly severe but did not go away.  She  called EMS to check my blood pressure.  She did have a blood pressure  of 200 systolic.  She was given nitroglycerin and transferred to [facility]  ER.  By 10 o'clock or so, she was pain-free on IV nitroglycerin.  Her  first troponin is positive at 3.0.  She admits that she has not felt  right for a couple of weeks now with vague fatigue.  She is admitted now  through the emergency room to CCU for further evaluation.   PAST MEDICAL HISTORY:  Remarkable for:  1. Peripheral vascular disease.  She has had a prior right fem-pop      bypass grafting in June of 2003, by Dr. Arbie Cookey. She is followed in      the office by Dr. Arbie Cookey.  She apparently has  had some claudication      in the left lower extremity and was to see him in the next few      days.  ABIs done February of 2010 at [facility] showed an ABI of 1.2 on      the right and 0.76 on the left.  2. History of type 2 non-insulin-dependent diabetes.  3. History of hypertension.  4. History of dyslipidemia.  She has had problems with statins and was      actually taken off on statins a few months go by Dr. Clarene Duke because      of myalgias and joint pain.  5. Previous knee surgery bilaterally.  6. Status post a laparoscopic cholecystectomy in the past.   CURRENT MEDICATIONS:  1. Coumadin 5 mg  daily with 2.5 mg on Monday and Friday.  2. Cozaar 100 mg daily.  3. Glipizide 5 mg b.i.d.  4. Furosemide 40 mg a day.  5. Metformin 100 mg b.i.d.  6. Metoprolol 75 mg a day.  7. Actos 30 mg a day.  8. Fenofibrate 54 mg a day.  9. Amitriptyline 10 mg twice a day.  10.Amlodipine 5 mg daily.  11.Temazepam 30 mg h.s. p.r.n.  12.Multivitamins daily.  13.Fish oil 1 g b.i.d.  14.Os-Cal 600 mg twice a day.  15.Aspirin 81 mg a day.  16.Darvocet p.r.n.   ALLERGIES:  Include:  1. INTOLERANCES to CODEINE.  2. CRESTOR caused joint pain and myalgia.   SOCIAL HISTORY:  She is a remote smoker, she quit 30 years ago.  She is  married.  Her daughter and husband accompanied her today.   FAMILY HISTORY:  There is no family history of coronary disease, her  mother did have a pacemaker.   REVIEW OF SYSTEMS:  She has had problems with DJD.  She just had a right  knee steroid injection today by Dr. [name].  She has had some  problems with her left rotator cuff.  She says she has had problems with  irritable bowel syndrome in the past.  She did have an endoscopy in  March of 2009, that showed hemorrhagic gastritis, this was by Dr. Sabino Gasser.  Colonoscopy in March of 2009, showed hemorrhoids.  She does have  claudication in her left leg.   PHYSICAL EXAMINATION:  VITAL SIGNS:  Blood pressure  150/56, pulse 84,  temperature 98.1, O2 is 97 on room air.  GENERAL:  She is a well-developed, well-nourished, obese female in no  acute distress.  HEENT:  Normocephalic, she wears glasses.  NECK:  Without JVD or bruit.  CHEST:  Clear to auscultation and percussion.  CARDIAC:  Regular rate and rhythm with a soft systolic murmur and  positive valve sounds at the left sternal border and in the aortic valve  area.  EXTREMITIES:  No edema, distal pulses are faint on the right and  diminished on the left.  NEURO:  Exam is grossly intact.  She is awake, alert, oriented, and  cooperative.   EKG shows a sinus rhythm with T-wave inversion in lead I, aVL, and some  slight ST depression in lead V3, V4, V5, and V6, the T-wave inversion is  old, but the ST depression appears to be somewhat more prominent than  previous EKGs.   OTHER LABORATORY DATA:  White count 7.4, hemoglobin 12.4, hematocrit  36.2, platelets 359.  Sodium 134, potassium 4.7, BUN of 24, creatinine  of 1.0.  Chest x-ray shows peribronchial cuffing and bibasilar  atelectasis.  Protime and PTT are pending and troponin is 3, and her MB  is 25, CK is pending.   IMPRESSION:  1. Non-ST elevation myocardial infarction.  2. History of coronary disease with coronary artery bypass grafting x1      in 1991, redo bypass grafting x3 in 2006 with catheterization in      February of 2008 showing loss of a saphenous vein graft to diagonal      graft to be treated medically.  3. Aortic stenosis with a St. Jude aortic valve replacement in 1991.  4. Peripheral vascular disease with a previous right fem-pop bypass      grafting 5 years ago, followed by Dr. Arbie Cookey, the patient has      ongoing left lower extremity claudication and  an ankle-brachial      index of 0.76 on the left.  5. Type 2 non-insulin-dependent diabetes.  6. Treated hypertension.  7. Dyslipidemia with history of STATIN INTOLERANCE.  8. History of hemorrhagic gastritis in the  past, this was revealed by      endoscopy in March of 2009.  9. Degenerative joint disease.   PLAN:  I discussed [name]'s case with Dr. Lynnea Ferrier tonight on the  phone.  We will go ahead and continue her on heparin and let her INR  drift down.  She is currently stable on IV nitroglycerin in the  emergency room.  She will be admitted to the CCU.      Abelino Derrick, P.A.    ______________________________  Thereasa Solo. Little, M.D.    Lenard Lance  D:  09/03/2008  T:  09/03/2008  Job:  [id number]

## 2010-06-02 NOTE — Op Note (Signed)
[name], [name]              ACCOUNT NO.:  [id number]   MEDICAL RECORD NO.:  [id number]          PATIENT TYPE:  AMB   LOCATION:  ENDO                         [facility]AC[facility]   PHYSICIAN:  Georgiana Spinner, M.D.    DATE OF BIRTH:  [birth year]   DATE OF PROCEDURE:  04/14/2007  DATE OF DISCHARGE:                               OPERATIVE REPORT   PROCEDURE:  Colonoscopy with biopsy.   ENDOSCOPIST:  Georgiana Spinner, M.D.   INDICATIONS:  Diarrhea.   ANESTHESIA:  Fentanyl 50 mcg, Versed 4 mg.   PROCEDURE:  With the patient mildly sedated in the left lateral  decubitus position, the Pentax videoscopic colonoscope was inserted in  the rectum and passed under direct vision.  With the patient turned to  her back, we were able to reach the cecum, identified by base of cecum  and ileocecal valve. both of which were photographed.  From this point,  the colonoscope was slowly withdrawn, taking circumferential views of  the colonic mucosa, stopping only to take random biopsies along the way  and suction liquid brown stool from the colon as we went.  The endoscope  was then withdrawn all the way to the rectum, which appeared normal on  direct and showed hemorrhoids on retroflexed view.  The endoscope was  straightened and withdrawn.  The patient's vital signs and pulse  oximetry remained stable.  The patient tolerated procedure well without  apparent complications.   FINDINGS:  Internal hemorrhoids, otherwise an unremarkable colonoscopic  examination, with random colon biopsies taken to evaluate for diarrhea.   PLAN:  The patient will call me for results of biopsy and follow up with  me as an outpatient.           ______________________________  Georgiana Spinner, M.D.     GMO/MEDQ  D:  04/14/2007  T:  04/15/2007  Job:  [id number]

## 2010-06-02 NOTE — Procedures (Signed)
BYPASS GRAFT EVALUATION   INDICATION:  Follow-up right lower extremity bypass graft, left lower  extremity claudication since last study.   HISTORY:  Diabetes:  Yes.  Cardiac:  CABG, AVR.  Hypertension:  Yes.  Smoking:  Previous.  Previous Surgery:  Right femoral-popliteal artery bypass graft  07/18/2001 by Dr. Arbie Cookey.   SINGLE LEVEL ARTERIAL EXAM                               RIGHT              LEFT  Brachial:                    159                155  Anterior tibial:             185                123  Posterior tibial:            172                120  Peroneal:  Ankle/brachial index:        1.16               0.77   PREVIOUS ABI:  Date: 03/11/2008  RIGHT:  1.2  LEFT:  0.76   LOWER EXTREMITY BYPASS GRAFT DUPLEX EXAM:   DUPLEX:  Doppler arterial waveforms appear biphasic proximal to, within  and distal to bypass graft with stable elevated velocities of 198 cm/s  at the distal anastomosis area.   IMPRESSION:  1. Patent right femoral-popliteal artery bypass graft.  2. Bilateral ankle brachial indices appear stable from previous study.  3. No significant changes from previous study.   ___________________________________________  Larina Earthly, M.D.   AS/MEDQ  D:  09/27/2008  T:  09/27/2008  Job:  [id number]

## 2010-06-02 NOTE — Assessment & Plan Note (Signed)
OFFICE VISIT   [name], [name]  DOB:  [birth year]                                       09/27/2008  EAVWU#:[id number]   The patient presents today for evaluation of lower extremity difficulty.  She reports that she is having increased symptoms in her left leg.  She  does have a prior right fem-pop bypass.  She reports that she is having  left leg claudication symptoms and this has progressed to some degree  with her.  She has several components.  She does have some resting  discomfort as well and also some back discomfort.   PAST HISTORY:  Is unchanged.  She does have hypertension, elevated  cholesterol, prior myocardial infarction, congestive heart failure.  She  does not smoke cigarettes, having quit 30 years ago.   PHYSICAL EXAM:  A well-developed well-nourished white female appearing  stated age 70.  Her femoral pulses are 2+ bilaterally.  She does have  palpable pedal pulses on the right and a palpable popliteal graft pulse  on the left.  She has absent pedal pulses.   She underwent noninvasive vascular laboratory studies in our office  today and this reveals no change from a prior study from February and  she has a normal ankle arm index on the right and a moderate reduction  on the left at 0.77.  I discussed this at length with the patient.  I  explained that her resting symptoms could not be related to arterial  insufficiency since she just has moderate only level of disease.  I  explained that she could undergo evaluation for further intervention,  but she feels that she is comfortable at this level of claudication,  knowing that it is not limb threatening.  She will be seen again in our  vascular lab protocol and was reassured with this discussion.   Larina Earthly, M.D.  Electronically Signed   TFE/MEDQ  D:  09/27/2008  T:  09/30/2008  Job:  [id number]

## 2010-06-05 NOTE — H&P (Signed)
[place]. [facility]  Patient:    [name], [name] Visit Number: [id number] MRN: [id number]          Service Type: SUR Location: 5700 [id number] [id number] Attending Physician:  Caralee Ates Dictated by:   Adair Patter, P.A. Admit Date:  07/16/2001                           History and Physical  CHIEF COMPLAINT:  Peripheral vascular disease.  HISTORY OF PRESENT ILLNESS:  The patient is a 70 year old female who has had a poorly-healing foot wound on her right lower extremity.  The primary care physician felt this was vascular in nature so she was referred to Dr. Arbie Cookey. The patient states she gets numbness and pain in her right foot with walking various distances.  The pain is located in the top of her foot but does not radiate up her calf.  She also reports night pain and rest pain.  She denies any paralysis of her foot but says her foot is cooler than the left.  She had [facility] performed at the [facility] which revealed significant reduction on the right lower extremity.  She now presents for angiogram.  PAST MEDICAL HISTORY: 1. Peripheral vascular disease. 2. Coronary artery disease. 3. Diabetes mellitus, non-insulin-dependent. 4. Hypertension. 5. Hypercholesterolemia.  PAST SURGICAL HISTORY: 1. Coronary artery bypass grafting. 2. Aortic valve replacement. 3. Laparoscopic cholecystectomy. 4. Bilateral ACL repairs. 5. Angiogram with stenting of her left lower extremity. 6. Bilateral carpal tunnel releases. 7. Removal of nodule of left hand. 8. Multiple heart catheterizations - the last one being three years ago.  ALLERGIES:  The patient says she is allergic to CODEINE which causes severe nausea.  MEDICATIONS: 1. Coumadin 5 mg p.o. q.d. - which was stopped on July 13, 2001. 2. Atenolol 100 mg p.o. q.d. 3. Lasix 40 mg p.o. q.d. 4. Glipizide 5 mg p.o. b.i.d. 5. Lipitor 80 mg p.o. q.d. 6. Enalapril 20 mg p.o. b.i.d. 7. Temazepam 30 mg p.o. q.h.s. 8. Baby  aspirin 81 mg p.o. q.d.  FAMILY HISTORY:  There is no family history of diabetes mellitus or coronary artery disease.  She states her mother had uterine and stomach cancer and had hypertension.  She had a maternal grandmother who had a stroke and she had a father who had esophageal cancer.  SOCIAL HISTORY:  The patient smoked approximately 1.5 packs of cigarettes per day for approximately 20 years but quit 20 years ago.  She denies any alcohol use.  PHYSICAL EXAMINATION:  VITAL SIGNS:  Blood pressure 120/50, pulse 64 and regular, respirations 16, temperature 98.4.  GENERAL:  The patient is alert and oriented x3, is in no distress.  HEENT:  Head is atraumatic, normocephalic.  Eyes:  Pupils equal, round, and reactive to light and accommodation.  Extraocular motions are intact without scleral icterus or nystagmus.  Ears:  Auditory acuity is grossly intact. Nose:  Nasal patency is intact.  Sinuses are nontender.  Mouth:  Moist without exudates.  NECK:  Supple without JVD, lymphadenopathy, carotid bruits, or thyromegaly.  CARDIOVASCULAR:  Regular rate and rhythm without murmurs, gallops, or rubs.  LUNGS:  Bilaterally clear to auscultation without rales, rhonchi, or wheezes.  ABDOMEN:  Soft, nontender, nondistended, with positive bowel sounds in all four quadrants.  EXTREMITIES:  Revealed no cyanosis, clubbing, or edema.  She had a nonhealing wound on the top of her right foot second toe and her right foot  was cool compared to the left.  PERIPHERAL PULSES:  Revealed 2+ carotid and femoral pulses bilaterally.  She had a 1+ dorsalis pedis pulses on the left foot.  She did not have any palpable dorsalis pedis pulse on the right foot and there was no palpable posterior tibial pulses.  NEUROLOGIC:  Cranial nerves II-XII were grossly intact.  No focal neurologic deficits were noted.  IMPRESSION:  Peripheral vascular disease.  PLAN:  We will reverse the patients Coumadin, give  vitamin K if her INR is greater than 1.4.  We will start heparin for anticoagulation.  The patient will tentatively undergo an aortogram with bilateral lower extremity runoff by Dr. Waverly Ferrari in the a.m. Dictated by:   Adair Patter, P.A. Attending Physician:  Caralee Ates. DD:  07/16/01 TD:  07/17/01 Job: [id number]

## 2010-06-05 NOTE — Discharge Summary (Signed)
[place]. [facility]  Patient:    MARETA, CHESNUT Visit Number: [id number] MRN: [id number]          Service Type: SUR Location: 2000 2035 [id number] Attending Physician:  Alyson Locket Dictated by:   Sherrie George, P.A. Admit Date:  07/16/2001 Discharge Date: 07/24/2001   CC:         Julieanne Manson, M.D.  Dr. Nida Boatman. Cleophas Dunker, M.D.  Thomas B. Samuella Cota, M.D.   Discharge Summary  ADMISSION DIAGNOSES: 1. Nonhealing right second toe ulcer. 2. Adult onset diabetes mellitus, noninsulin-dependent. 3. Coronary artery disease, status post coronary artery bypass grafting and    aortic valve replacement, on chronic Coumadin. 4. Hypertension. 5. History of tobacco use. 6. Hypercholesterolemia.  DISCHARGE DIAGNOSES: 1. Nonhealing right second toe ulcer. 2. Adult onset diabetes mellitus, noninsulin-dependent. 3. Coronary artery disease, status post coronary artery bypass grafting and    aortic valve replacement, on chronic Coumadin. 4. Hypertension. 5. History of tobacco use. 6. Hypercholesterolemia.  PROCEDURE: 1. Aortogram, bilateral iliac arteriograms with bilateral lower extremity    runoff, July 17, 2001, Dr. Edilia Bo. 2. Right femoral to above-knee popliteal bypass graft with nonreversed    saphenous vein graft from the right leg and intraoperative arteriogram,    July 18, 2001.  HISTORY OF PRESENT ILLNESS:  The patient is a 70 year old white female, a medical patient of Drs. Julieanne Manson, Anthony Sar, [name], and Rozetta Nunnery, who was referred for a nonhealing ulcer to her right second toe. She has a long medical history, including coronary bypass grafting and aortic valve replacement, on chronic Coumadin.  For this reason her Coumadin was discontinued, and she was admitted for arteriogram and then revascularization.  PAST MEDICAL HISTORY:  Peripheral vascular disease, coronary artery disease, noninsulin-dependent  diabetes mellitus type 2, hypertension, hypercholesterolemia.  PAST SURGICAL HISTORY:  Coronary artery bypass grafting, aortic valve replacement, laparoscopic cholecystectomy, and bilateral ACL repairs, angiograms, stenting of her left lower extremity, bilateral carpal tunnel releases, removal of nodule in her left hand, multiple cardiac catheterizations.  ALLERGIES:  CODEINE, which causes nausea.  MEDICATIONS:  Coumadin 5 mg q.d. (this was continued on Jun 12, 2001), atenolol 100 mg p.o. q.d., Lasix 40 mg q.d., glipizide 5 mg p.o. b.i.d., Lipitor 80 mg p.o. q.d., enalapril 20 mg p.o. b.i.d., Restoril 30 mg p.o. h.s., and aspirin 81 mg p.o. q.d.  For further H&P, see the dictated note.  HOSPITAL COURSE:  The patient was admitted and underwent arteriogram by Dr. Edilia Bo.  This showed an occluded superficial femoral artery on the right with reconstitution of the above-knee popliteal and moderate tibial occlusive disease.  There was also mild superficial femoral artery occlusive disease on the right.  After reviewing the studies, it was Dr. Nicholes Mango opinion the patient should undergo a right femoral to above-knee bypass graft to restore flow to her right lower extremity.  The risks and benefits were discussed in detail, and informed operative consent was obtained.  She was taken to the OR on July 18, 2001, and underwent procedure as described above.  She tolerated the procedure well and returned to the recovery room and then to 3300 in satisfactory condition.  She did well postoperatively.  She was placed on heparin, and her Coumadin was restarted.  These were controlled with daily protimes and PTTs.  She has made slow, steady progress postoperatively.  ABIs have gone up to 0.79 from a preadmission ABI of 0.57 on the right.  ABIs on the  left 0.97 preoperatively and 1 postop.  The patient was transferred to the floor and started on progressive ambulation.  Her heparin and Coumadin  have been controlled by pharmacy.  By July 22, 2001, her protime was up to 22.3 seconds with an INR of 2.2.  It was Dr. Rodell Perna opinion that if she continued to do well and her INR was greater than 2.5, she could be discharged on the a.m. of July 23, 2001.  DISCHARGE INSTRUCTIONS:  Discharge activity will be light to moderate, no lifting over 10 pounds, no driving, no strenuous activity.  She is to maintain a diabetic diet.  She is to clean the incisions with plan soap and water, keep the toe protected on the right.  She is also to remove her Steri-Strips in three to four days.  She is to follow up with Dr. Clarene Duke to check her Coumadin on Wednesday, July 26, 2001, and is to return to see Dr. Arbie Cookey in approximately two weeks with 30-minute Dopplers.  Our office will call and arrange this.  DISCHARGE LABORATORY DATA:  Hemoglobin is 9.6., hematocrit is 28.5, white count 7.0, platelet count 245,000.  Her urinalysis on admission showed many epithelial, 21-50 white cells, and many bacteria.  Because of her antibiotics, she was not cultured.  At discharge her white count is 7.6, hemoglobin is 10.5, hematocrit is 30.7, platelets are 228,000.  Protime on July 5 was 22.3 with an INR of 2.2.  Electrolytes are normal with a BUN of 7 and creatinine of 0.9.  CONDITION ON DISCHARGE:  Improved. Dictated by:   Sherrie George, P.A. Attending Physician:  Alyson Locket DD:  07/22/01 TD:  07/22/01 Job: [id number]

## 2010-06-05 NOTE — Cardiovascular Report (Signed)
[name], [name]              ACCOUNT NO.:  [id number]   MEDICAL RECORD NO.:  [id number]          PATIENT TYPE:  INP   LOCATION:  2015                         FACILITY:  [facility]   PHYSICIAN:  Thereasa Solo. Little, M.D. DATE OF BIRTH:  18-Jul-1940   DATE OF PROCEDURE:  04/29/2004  DATE OF DISCHARGE:                              CARDIAC CATHETERIZATION   INDICATIONS FOR TEST:  This 70 year old female had left internal mammary  artery to her LAD and St. Jude prosthetic aortic valve placed in 1991. She  presented with increasing episodes of dyspnea on exertion and throat pain in  late February.  A nuclear study was performed that was positive for inferior  lateral ischemia and at that time had a 50% ejection fraction. She is  brought in for cardiac catheterization.  She was hospitalized yesterday for  heparin/Coumadin  cross over.   PROCEDURE:  After obtaining informed consent the patient was prepped and  draped in the usual sterile fashion exposing the right groin. Following  local anesthetic with 1% Xylocaine, the Seldinger technique was employed and  a 5-French introducer sheath was placed into her right femoral artery.  A  left and right coronary arteriography and visualization of the internal  mammary artery to the LAD was performed.   No ventriculogram was performed because of the St. Jude's prosthetic aortic  valve (EF 50% on March 12, 2004).   COMPLICATIONS:  None.   TOTAL CONTRAST USED:  80 mL.   EQUIPMENT:  5-French Judkins configuration catheters and the internal  mammary artery was easily cannulated with a 5-French right Judkins catheter.   RESULTS:  1.  Hemodynamic monitoring central aortic pressure was 175/75.  2.  Fluoroscopy: The St. Jude's aortic valve was seen to open it was      appropriately on fluoroscopy.   Coronary arteriography on fluoroscopy:  There was dense calcification  outlining the entire LAD and circumflex system. There was mild calcification  of the RCA system.  1.  Left main normal and bifurcated.  2.  LAD: The LAD was a small vessel appeared to be diffusely diseased.  The      proximal portion of the LAD had moderate disease throughout.  There was      bidirectional flow in the distal portion of the LAD. The diagonal system      which came off proximally had a proximal area of 80% narrowing. The      distal LAD was seen via the internal mammary artery graft and there was      some retrograde filling into the diagonal system. The LAD, diagonals,      however, appeared to be relatively small and I cannot tell whether or      not this is because they are under filled or not.  3.  Circumflex:  The circumflex was a very large 3.5+ mm vessel. The      proximal vessel had a area of 70% narrowing. The midportion had a 20 mm      long area of 60% narrowing. There was a first OM vessel that had no  significant disease in it and then there was a very large OM #2 and in      the proximal portion of this OM #2 right after the OM #1 bifurcation was      a 95% area of narrowing. The distal OM #2 was free of disease.  4.  Right coronary artery:  The right coronary was a medium-sized vessel      about 2 to 2.5 mm. There was a proximal area of 60% narrowing, a mid      area of 8% eccentric narrowing and a distal area that was hyperlucent of      about 60%. The PDA was relatively small.  5.  Left internal mammary artery to the distal LAD. This graft was widely      patent. The distal LAD was small but appeared to be free of disease and      there was some retrograde filling up the LAD into of the diagonal      system.   CONCLUSION:  Patent graft to the distal left anterior descending with high-  grade multiple lesions in both the circumflex and the right coronary artery  system. Her diagonal is unprotected and although there is some bidirectional  flow from both native and retrograde at the left anterior descending, there  is high-grade  stenosis in the proximal portion of the diagonal system which  is therefore on unprotected area.   I have asked CVTS to see the patient for consideration of redo bypass  surgery and plan to start her back on her IV heparin three hours after the  cath because of her St. Jude aortic valve and I have requested no Coumadin  be used because of the potential revascularization.      ABL/MEDQ  D:  04/29/2004  T:  04/29/2004  Job:  [id number]   cc:   Soyla Murphy. Renne Crigler, M.D.  [address]  [place]  [place] [address]  Fax: [phone]   Cath Lab

## 2010-06-05 NOTE — Op Note (Signed)
NAMEALLYSE, FREGEAU NO.:  [id number]   MEDICAL RECORD NO.:  [id number]          PATIENT TYPE:  INP   LOCATION:  2005                         FACILITY:  [facility]   PHYSICIAN:  Sheliah Plane, MD    DATE OF BIRTH:  [birth year]   DATE OF PROCEDURE:  05/06/2004  DATE OF DISCHARGE:                                 OPERATIVE REPORT   PREOPERATIVE DIAGNOSIS:  Progressive coronary occlusive disease.   POSTOPERATIVE DIAGNOSIS:  Progressive coronary occlusive disease.   SURGICAL PROCEDURE:  Redo coronary artery bypass grafting x3 with reverse  saphenous vein graft to the distal right coronary artery, reverse saphenous  vein graft to the distal circumflex coronary artery, and reverse saphenous  vein graft to the diagonal coronary artery with endovein harvesting, left  leg.   SURGEON:  Sheliah Plane, MD.   FIRST ASSISTANT:  Tressie Stalker, MD.   SECOND ASSISTANT:  Stephanie Acre. Dominick, PA.   BRIEF HISTORY:  Patient is a 70 year old female, who in 1991 had undergone  aortic valve replacement and coronary artery bypass grafting x1 with a left  internal mammary to the left anterior descending coronary artery.  She now  presents with recurrent anginal symptoms and positive stress test.  Repeat  cardiac catheterization showed progressive disease in the right coronary  artery, the circumflex coronary artery, and diagonal coronary artery.  The  left internal mammary coronary artery was patent to the  LAD.  Because of  the progressive nature of the patient's disease, redo coronary artery bypass  grafting was recommended.  The previously placed aortic valve was  functioning well by echo.  Redo coronary artery bypass grafting was  discussed with the patient, who agreed and signed informed consent.   DESCRIPTION OF PROCEDURE:  With Swan-Ganz and arterial line monitors in  place, the patient underwent general endotracheal anesthesia without  incident.  The skin of the chest wall  was prepped with Betadine and draped  in the usual sterile manner.  Using a Guidant endovein harvesting system,  vein was harvested from the right and then also in the left thigh and in the  upper part of the left lower leg.  The vein was of good quality and caliber.  Using a sagittal saw, a bridging median sternotomy was performed.  The  underlying tissue was dissected off the sternum, and the ascending aorta and  the right atrium were dissected free and were suitable for cannulation.  The  patient was systemically heparinized, the ascending aorta was cannulated,  the right atrium was cannulated, a retrograde cardioplegia catheter was  introduced through the right atrial wall.  The patient was placed on  cardiopulmonary bypass, 2.4 liters per minute per metered square.  The  remainder of the left ventricle was then dissected out.  The mammary artery  was identified and encircled with a vessel loop and preserved.  The  patient's body temperature was then cooled to 30 degrees.  An aortic cross-  clamp was applied and 500 mL of cold blood potassium cardioplegia was  administered antegrade through an aortic root bent cardioplegia needle.  A  bulldog was placed on the mammary artery with the cross-clamp.  Additional  cold blood cardioplegia was administered retrograde.  __________ throughout  the cross-clamp.  Attention was turned first to the distal circumflex, which  was opened and admitted a 1.5-mm probe.  There was moderate disease at the  site of anastomosis.  Using a running 7-0 Prolene, distal anastomosis was  performed.  Additional cold blood cardioplegia was administered down the  vein grafts.  Attention was then turned to the diagonal coronary artery.  The first diagonal coronary artery, which was a small vessel, but did admit  a 1-mm probe.  Using a running 7-0 Prolene, distal anastomosis was  performed.  Attention was then turned to the distal right coronary artery,  which was  opened and admitted a 1.5-mm probe.  The vessel had diffuse  disease.  Using a running 7-0 Prolene, distal anastomosis was performed.  Additional cold blood cardioplegia was administered down the vein grafts.  With the cross-clamp still in place, three punch aortotomies were performed  on the ascending aorta.  Each of three vein grafts were anastomosed to the  ascending aorta.  Prior to complete closure of the final anastomosis, the  heart was allowed to passively fill and de-air.  The anastomosis was  completed and the aortic cross-clamp was removed.  Total cross-clamp time of  96 minutes.  Patient's body temperature was rewarmed to 37 degrees.  She was  spontaneously converted to a sinus rhythm.  Two right ventricular pacing  wires were applied.  The patient was then ventilated and weaned from  cardiopulmonary bypass without difficulty and remained __________  and dry  dressings were applied.  Sponge and needle count was reported as correct at  the completion of the procedure.  Patient tolerated the procedure without  obvious complication and was transferred to the surgical intensive care unit  for further postoperative care.      EG/MEDQ  D:  05/10/2004  T:  05/10/2004  Job:  [id number]   cc:   Thereasa Solo. Little, M.D.  [address]  [address]  [place]  [place] [address]  Fax: [phone]

## 2010-06-05 NOTE — Op Note (Signed)
[place]  Patient:    [name], [name] Visit Number: [id number] MRN: [id number]          Service Type: SUR Location: 2000 2035 01 Attending Physician:  Alyson Locket Dictated by:   Larina Earthly, M.D. Proc. Date: 07/18/01 Admit Date:  07/16/2001                             Operative Report  PREOPERATIVE DIAGNOSIS:  Right foot ischemia.  POSTOPERATIVE DIAGNOSIS:  Right foot ischemia.  OPERATION PERFORMED:  Right femoral to above-knee popliteal bypass, translocated nonreversed saphenous vein, intraoperative arteriogram.  SURGEON:  Larina Earthly, M.D.  ASSISTANT:  Maxwell Marion, RNFA  ANESTHESIA:  General endotracheal.  COMPLICATIONS:  None.  DISPOSITION:  To recovery room stable.  DESCRIPTION OF PROCEDURE:  The patient was taken to the operating room and placed in supine position where the area of the right groin and right leg were prepped and draped in the usual sterile fashion.  Incision was made over the femoral pulse and carried down to isolate the common, superficial femoral and profunda femoris arteries.  The common femoral artery was small and calcified but had adequate pulse.  The saphenous vein was identified through the same incision, the tributary branches of the saphenofemoral junction were ligated and divided.  The vein was unroofed from the level of the groin to the knee, leaving several skin bridges.  The vein was of adequate caliber.  The above-knee popliteal artery was exposed through the distal vein harvest incision and was of good caliber with minimal calcification.  A tunnel was created from the level of the above-knee popliteal artery to the groin.  The saphenous vein was ligated distally and divided and was occluded with a Cooley clamp at the saphenofemoral junction and was excised from the saphenofemoral junction and the saphenofemoral junction was oversewn with 5-0 Prolene suture. The saphenous vein  was gently dilated and was of good caliber.  Next, the patient was given 7000 units of intravenous heparin.  After adequate circulation time, the common, superficial femoral and profunda femoris arteries were occluded.  The common femoral artery was opened with an 11 blade and extended longitudinally with Potts scissors.  The saphenous vein was brought onto the field.  The proximal portion was spatulated and sewn end-to-side to the artery with running 6-0 Prolene suture.  Anastomosis was tested and found to be adequate.  Next the vein was brought through the prior prior created tunnel to the level of the above knee popliteal artery.  The artery was occluded proximally and distally.  It was opened with an 11 blade and extended longitudinally with Potts scissors.  The vein was cut to the appropriate length and was spatulated and sewn end-to-side to the artery with a running 6-0 Prolene suture.  A 4 dilator passed easily through the venous anastomosis without resistance.  Anastomosis was completed and the clamps were removed.  Next, using a 21 gauge butterfly needle through the proximal vein graft, intraoperative arteriogram was obtained, and this revealed a technically good distal anastomosis and popliteal artery patency with peroneal artery as the primary as the primary runoff vessel.  The patient was given 50 mg of protamine to reverse heparin.  The wound was irrigated with saline. Hemostasis electrocautery.  The wounds were closed with 3-0 and 2-0 Vicryl in the subcutaneous tissues.  The skin was closed with 3-0 subcuticular Vicryl suture stitch.  Sterile dressings were applied.  The patient was transferred to the recovery room in stable condition. Dictated by:   Larina Earthly, M.D. Attending Physician:  Alyson Locket DD:  07/18/01 TD:  07/20/01 Job: [id number]

## 2010-06-05 NOTE — Discharge Summary (Signed)
[name], VAN NO.:  [id number]   MEDICAL RECORD NO.:  [id number]          PATIENT TYPE:  REC   LOCATION:  REHS                         FACILITY:  [facility]   PHYSICIAN:  Sheliah Plane, MD    DATE OF BIRTH:  March 14, 1940   DATE OF ADMISSION:  06/17/2004  DATE OF DISCHARGE:  07/08/2004                                 DISCHARGE SUMMARY   PRIMARY DIAGNOSIS:  Progressive coronary artery disease.   IN-HOSPITAL DIAGNOSIS:  Acute blood loss anemia.   SECONDARY DIAGNOSES:  1.  Aortic valve stenosis, status post aortic valve replacement done with      mechanical valve back in 1991.  2.  Coronary artery disease, status post stent placement to right coronary      artery back in 1997. Status post coronary artery bypass graft.  3.  Hypercholesterolemia.  4.  Peripheral vascular disease.  5.  Diabetes mellitus.  6.  Hypertension.   ALLERGIES:  CODEINE.   PROCEDURE:  1.  Cardiac catheterization.  2.  Redo coronary artery bypass grafting x3 with reversed saphenous vein      graft to the distal right coronary artery, reversed saphenous vein graft      to the distal circumflex coronary artery, reversed saphenous vein graft      to the diagonal coronary artery.  Note, vein harvesting from the left      leg was done.   HISTORY OF PRESENT ILLNESS:  [name] is a 70 year old female who has a  past medical history of aortic stenosis status post aortic valve replacement  with mechanical valve back in 1991.  The patient has been on Coumadin since  then.  The patient also has a history of coronary artery disease, status  post coronary artery bypass graft.  She has also undergone multiple stent  placements.  In February of 2006, the patient presented to Dr. Fredirick Maudlin  office due to change in pain pattern.  A nuclear study was performed on  March 12, 2004, that showed a 50% ejection fraction, but inferolateral  ischemia which was more significant than seen on previous  stress tests.  He  discussed with the patient at that time undergoing cardiac catheterization.  This was done on April 29, 2004, showing patent graft to the distal left  anterior descending with high grade multiple lesions in both the circumflex  and right coronary artery systems.  There was also high grade stenosis in  the proximal portion of the diagonal system.  Noted a St. Jude aortic valve  was seen to be open appropriately on fluoroscopy.  We were then consulted.  The patient was seen and evaluated by Sheliah Plane, M.D.  Dr. Tyrone Sage  discussed with patient undergoing redo coronary artery bypass grafting.  He  discussed the risks and benefits of this procedure.  The patient nods her  understands.  She wishes to proceed.  She was scheduled to undergo surgery  on May 06, 2004.  She was admitted to the hospital follow cardiac  catheterization and remainder stable until surgery.   For details of the patient's past medical history  and physical examination,  please see dictated history and physical.   HOSPITAL COURSE:  [name] was taken to the operating room on May 06, 2004, where she underwent redo coronary artery bypass grafting x3 with a  reversed saphenous vein graft to the distal right coronary artery, reversed  saphenous vein graft to the distal circumflex coronary artery, reversed  saphenous vein graft to the diagonal coronary artery.  Noted endoscopic vein  harvesting from the left leg was done.  The patient tolerated this procedure  well and was transferred up to the intensive care unit in stable condition.  She was extubated shortly following surgery.  On postoperative day #1, the  patient was seen to be awake and alert x3, neurologically intact.  She  seemed to be hemodynamically stable with hematocrit 25.6.  Creatinine was  stable at 0.8.  Chest tubes were discontinued.  She was saturating 98% on  room air.  Postoperative day #2, the patient was progressing well.   She  remained afebrile.  Hematocrit remained stable at 25%.  The patient was  started back on her Coumadin.  She was out of bed ambulating well.  On  postoperative day #3, the patient was in normal sinus rhythm.  External  pacer was discontinued.  She did have volume overload and was started on  diuretics.  Incisions were dry and intact and healing well.  Coumadin was  continued.  On postoperative day #4, the patient was progressing well.  She  remained in normal sinus rhythm.  She was afebrile.  She was transferred up  to 2000 on postoperative day #3.  On postoperative day #4, the patient was  out of bed and ambulating well.  She was saturating 98% on room air.  The  incisions were dry and intact and healing well.  H&H was remaining stable at  8.5 and 25.  Creatinine was stable at 0.9.  CBG's were slightly elevated.  These were monitored and the patient was continued on her home medication as  well as insulin sliding scale.  She was started on iron for the acute blood  loss anemia.  On postoperative day #5, the patient continued to do well.  She remained in normal sinus rhythm.  Diuretics were continued for volume  excess.  CBG's were better controlled.  The patient was continued on  Coumadin and INR monitored.  Postoperative day #6, the patient's CBG's were  seen to be elevated.  These were monitored.  She was in normal sinus rhythm.  Continuing to progress well.  The patient was discharged to home on  postoperative day #7 in stable condition.  INR 2.9.  She was saturating 97%  on room air.  She remained in normal sinus rhythm and incisions dry, intact,  and healing well.   FOLLOW UP:  A follow-up appointment will be scheduled with Dr. Tyrone Sage for  3 weeks.  She will follow up with Dr. Clarene Duke in 2 weeks to obtain a PA and  lateral chest x-ray.  At that time he will give her the films to bring to  Dr. Dennie Maizes appointment.  [name] received instructions on activity level and  incisional care.  She was told no driving until released to do so, no heavy lifting over 10  pounds.  She is told she is allowed to shower washing her incisions using  soap and water.  She is contact the office if she develops any drainage or  opening from any of her incision  sites.  The patient acknowledged her  understanding.  She is told to ambulate three to four times per day and  progress as tolerated.  She has an appointment set up to get her PT/INR  checked on May 07, 2004, at Dr. Fredirick Maudlin office.   DISCHARGE MEDICATIONS:  1.  Aspirin 81 mg p.o. daily.  2.  Toprol XL 50 mg p.o. daily.  3.  Lipitor 80 mg p.o. q.h.s.  4.  Cozaar 50 mg daily.  5.  Coumadin 7.5 mg daily.  6.  Actos 50 mg daily.  7.  Glucotrol 15 mg b.i.d.  8.  Lasix 40 mg daily x5 days.  9.  Potassium chloride 20 mEq daily x5 days.  10. Ultram 50 mg one to two tablets p.o. q.4-6 hours p.r.n. pain.       KMD/MEDQ  D:  07/08/2004  T:  07/08/2004  Job:  [id number]

## 2010-06-05 NOTE — Procedures (Signed)
[place]  Patient:    [name], [name] Visit Number: [id number] MRN: [id number]          Service Type: SUR Location: 5700 5740 01 Attending Physician:  Caralee Ates Dictated by:   Di Kindle Edilia Bo, M.D. Proc. Date: 07/17/01 Admit Date:  07/16/2001   CC:         Larina Earthly, M.D.  [facility]   Procedure Report  PROCEDURES: 1. Aortogram. 2. Bilateral iliac arteriogram. 3. Bilateral lower extremity runoff.  INDICATION:  This is a pleasant 70 year old woman who presents with a nonhealing wound of her right foot.  She has an ABI of 57% on the right and is felt to have superficial femoral artery and possible tibial occlusive disease. She was brought in for diagnostic arteriography in order to determine if she was a candidate for revascularization.  The procedures and potential complications of the procedure, including but not limited to bleeding, arterial injury, dye reaction, and kidney failure, were discussed with the patient preoperatively.  All of her questions were answered, and she is agreeable to proceed.  DESCRIPTION OF PROCEDURE:  The patient was brought to the PV lab at [facility] and sedated with 1 mg of Versed and 15 mcg of fentanyl.  Both groins were prepped and draped in the usual sterile fashion.  After the skin was anesthetized with 1% lidocaine, the right common femoral artery was cannulated and a guidewire introduced into the infrarenal aorta under fluoroscopic control.  A 5 French sheath was passed over the wire and the dilator was removed.  The pigtail catheter was positioned over the wire at the L1 vertebral body and flush aortogram obtained.  The catheter was then brought down above the bifurcation and oblique iliac projections were obtained.  Next bilateral lower extremity runoff films were obtained.  The dilating catheter was removed over a wire and some additional spot films were obtained  on the right through the right femoral sheath.  FINDINGS:  There are single renal arteries bilaterally with no significant renal artery stenosis identified.  There is no significant atherosclerotic disease of the infrarenal aorta, common iliac arteries bilaterally, hypogastric arteries bilaterally, or external iliac arteries bilaterally =.  On the left side the common femoral and deep femoral arteries are widely patent.  There is some mild irregularity of the proximal superficial femoral artery, which is then patent.  There is a mild 40% stenosis at the adductor canal and some mild diffuse proximal to this with no focal stenosis above that.  The popliteal artery is patent.  The anterior tibial is visualized proximally and is patent but is poorly-visualized distally.  The tibioperoneal trunk, peroneal, and posterior tibial arteries are patent on the left.  On the right side the common femoral artery and deep femoral artery are patent.  The superficial femoral artery is occluded at its origin with reconstitution of the above-knee popliteal artery, which is patent.  The anterior tibial and posterior tibial arteries have diffuse disease and are quite small.  The posterior tibial artery occludes in the midcalf.  The peroneal artery is the dominant runoff vessel, and this is patent.  The anterior tibial artery is patent to the ankle and is then occluded.  Again this is a small, diffusely diseased vessel.  CONCLUSIONS: 1. Occluded superficial femoral artery on the right with reconstitution of the    above-knee popliteal artery and moderate tibial occlusive disease as    described above. 2. Mild  superficial femoral artery occlusive disease on the left as described    above. Dictated by:   Di Kindle Edilia Bo, M.D. Attending Physician:  Caralee Ates. DD:  07/17/01 TD:  07/18/01 Job: [id number]

## 2010-06-05 NOTE — Cardiovascular Report (Signed)
[name], [name]              ACCOUNT NO.:  [id number]   MEDICAL RECORD NO.:  [id number]          PATIENT TYPE:  OIB   LOCATION:  2899                         FACILITY:  [facility]   PHYSICIAN:  Thereasa Solo. Little, M.D. DATE OF BIRTH:  11-08-1940   DATE OF PROCEDURE:  03/02/2006  DATE OF DISCHARGE:  03/02/2006                            CARDIAC CATHETERIZATION   INDICATIONS FOR TEST:  This 70 year old female has hypertension,  diabetes and hyperlipidemia.  She underwent single vessel CABG with an  internal mammary artery to her LAD and a St. Jude aortic valve  replacement in 1991.  She subsequently had redo bypass surgery in 2006  that included three saphenous vein grafts.  One to the PDA, one to the  OM and one to the diagonal.  She has had no anginal complaints but on  routine followup her yearly EKG showed new diffuse T wave inversion  across the entire precordial leads and inferior leads.  Because of this  she was brought in for outpatient cardiac catheterization.   Because of her St. Jude valve, her Coumadin was interrupted four days  prior to this.  She has been on b.i.d. subcutaneous Lovenox.  Her last  Lovenox injection was at 9:00 p.m. the night prior to the cath.   PROCEDURE:  After obtaining informed consent, the patient was prepped  and draped in the usual sterile fashion exposing the left groin.  Following local anesthetic with 1% Xylocaine multiple attempts with the  thin wall needle were undertaken to enter the femoral artery.  These  were unsuccessful.  Finally, a smart needle was used.  The femoral  artery was easily cannulated but a standard 035 guide wire would not  pass into the artery.  A wooly wire was used and easily advanced up the  iliacs into the descending aorta.  Following this a 5-Fr introducer  sheath was placed into the left femoral artery left and right coronary  arteriography graft visualization and distal aortogram was performed.   Ventriculography  was not performed.  Did not cross the aortic valve  since this a mechanical valve.  An echocardiogram had been performed as  an outpatient prior to the cath and it showed normal LV systolic  function and appropriate valve function.   Equipment:  5-Fr Judkins configuration catheters and a JR4 catheter was  used to cannulate all the grafts and the internal mammary artery.   Total contrast:  125 mL.   COMPLICATIONS:  None.   RESULTS:  Central aortic pressure was 195/68.   Distal aortogram performed at the level of the renal artery using 25 mL  of contrast at 12 mL per second showed no evidence of renal artery  stenosis and no evidence of an abdominal aortic aneurysm.   Coronary arteriography:  On fluoroscopy there was dense calcification  noted in the distribution of the RCA, the left main and the proximal  LAD.  The St. Jude valve was easily visualized on fluoroscopy and showed  appropriate valve motion.   1. Left main normal and bifurcated.  2. Circumflex. The proximal circumflex had an area of  50% narrowing.      It supplied an OM vessel that appeared to be free of disease and      distal to the take off of OM the entire circumflex system is 100%      occluded.  The distal OM was grafted.  3. LAD.  The LAD was 100% occluded after the first diagonal.  The      first diagonal was very small, diffusely diseased and amenable only      to medical therapy.  4. Right coronary artery.  The right coronary artery had mild to      moderate irregularities in the mid and proximal segment.  The      distal RCA had an area of 80% narrowing.  Two posterolateral      vessels were visualized and they were relatively small in diameter.      The PDA showed bidirectional flow.  5. Grafts:  The saphenous vein graft to the OM was widely patent.  The      OM was widely patent and there was retrograde filling at the      circumflex.  6. Saphenous vein graft to the PDA.  This graft was widely patent.   It      inserted into the PDA.  The PDA showed mild to moderate disease      distal to the insertion site of the saphenous vein graft.  There      was retrograde filling of the distal RCA and posterolateral      vessels.  7. Internal mammary artery to the LAD.  The internal mammary artery      was widely patent.  The LAD was widely patent.   CONCLUSION:  1. No evidence of renal artery stenosis or abdominal aortic aneurysm.  2. Diffuse native coronary disease.  3. Loss of the saphenous vein graft to a diagonal and the native      diagonal is a small vessel that is still patent but no amenable to      any type of intervention.  4. Patent saphenous vein graft to the PDA.  5. Saphenous vein graft to the OM.  6. Patent internal mammary artery to the LAD.   It appears that the diffuse EKG changes are the result of the loss of  the diagonal graft.  She will be maintained on medical therapy.   She will be given a dose of subcutaneous Lovenox and will be maintained  on Lovenox until her INR is therapeutic.  She will be discharged to home  later today.           ______________________________  Thereasa Solo. Little, M.D.     ABL/MEDQ  D:  03/02/2006  T:  03/03/2006  Job:  [id number]   cc:   Soyla Murphy. Renne Crigler, M.D.  Cardiac Cath Lab

## 2011-01-22 ENCOUNTER — Ambulatory Visit (INDEPENDENT_AMBULATORY_CARE_PROVIDER_SITE_OTHER): Payer: Medicare Other | Admitting: Internal Medicine

## 2011-01-22 ENCOUNTER — Ambulatory Visit (INDEPENDENT_AMBULATORY_CARE_PROVIDER_SITE_OTHER)
Admission: RE | Admit: 2011-01-22 | Discharge: 2011-01-22 | Disposition: A | Discharge status: Home / Self Care | Payer: Medicare Other | Source: Ambulatory Visit | Attending: Internal Medicine | Admitting: Internal Medicine

## 2011-01-22 ENCOUNTER — Encounter: Payer: Self-pay | Admitting: Internal Medicine

## 2011-01-22 VITALS — BP 118/64 | HR 82 | Temp 97.9°F | Ht 62.0 in | Wt 171.8 lb

## 2011-01-22 DIAGNOSIS — R059 Cough, unspecified: Secondary | ICD-10-CM | POA: Insufficient documentation

## 2011-01-22 DIAGNOSIS — R05 Cough: Secondary | ICD-10-CM

## 2011-01-22 MED ORDER — PREDNISONE (PAK) 10 MG PO TABS: ORAL_TABLET | ORAL | Status: AC

## 2011-01-22 MED ORDER — AMOXICILLIN-POT CLAVULANATE 875-125 MG PO TABS: 1.0000 | ORAL_TABLET | Freq: Two times a day (BID) | ORAL | Status: AC

## 2011-01-22 NOTE — Progress Notes (Signed)
  Subjective:    Patient ID: [name], female    DOB: 07-13-1940, 71 y.o.   MRN: [id number]  HPI  69 yowf quit smoking 1975 with recurrent bronchitis resolved x one episode where saw Dr Maple Hudson dx  mild asthma no maint treatment referred back to pulmonary clinic 01/22/2011 by Dr Renne Crigler for cough  01/22/2011 1st pulmonary eval in EMR era cc lump in throat/ congestion with yellow mucus esp in afternoons acute onset September felt like a cold  With persistent nasal drainage which is clear. rx with zpak x 3 and avelox and prednisone   Sleeping ok   But early am exacerbation  of respiratory  c/o's of cough and congestion. No sob unless coughing.    Also denies any obvious fluctuation of symptoms with weather or environmental changes or other aggravating or alleviating factors except as outlined above   Review of Systems  Constitutional: Negative for fever and unexpected weight change.  HENT: Positive for congestion, rhinorrhea, postnasal drip and sinus pressure. Negative for ear pain, nosebleeds, sore throat, sneezing, trouble swallowing and dental problem.   Eyes: Positive for visual disturbance. Negative for redness and itching.  Respiratory: Positive for cough, chest tightness, shortness of breath and wheezing.   Cardiovascular: Positive for chest pain, palpitations and leg swelling.  Gastrointestinal: Negative for nausea and vomiting.  Genitourinary: Negative for dysuria.  Musculoskeletal: Positive for joint swelling.  Skin: Negative for rash.  Neurological: Negative for headaches.  Hematological: Bruises/bleeds easily.  Psychiatric/Behavioral: Negative for dysphoric mood. The patient is not nervous/anxious.        Objective:   Physical Exam  Ambulatory wf nad with freq throat clearing during interview and exam 01/22/2011 wt 171 HEENT: nl dentition, turbinates, and orophanx. Nl external ear canals without cough reflex   NECK :  without JVD/Nodes/TM/ nl carotid upstrokes  bilaterally   LUNGS: no acc muscle use, clear to A and P bilaterally without cough on insp or exp maneuvers   CV:  RRR  no s3 or murmur or increase in P2, no edema   ABD:  soft and nontender with nl excursion in the supine position. No bruits or organomegaly, bowel sounds nl  MS:  warm without deformities, calf tenderness, cyanosis or clubbing  SKIN: warm and dry without lesions    NEURO:  alert, approp, no deficits    CXR  01/22/2011 :   Cardiomegaly without acute disease.       Assessment & Plan:

## 2011-01-22 NOTE — Progress Notes (Deleted)
[id number]

## 2011-01-22 NOTE — Patient Instructions (Addendum)
Stop fish oil  GERD (REFLUX)  is an extremely common cause of respiratory symptoms (like a cough) , many times with no significant heartburn at all.    It can be treated with medication, but also with lifestyle changes including avoidance of late meals, excessive alcohol, smoking cessation, and avoid fatty foods, chocolate, peppermint, colas, red wine, and acidic juices such as orange juice.  NO MINT OR MENTHOL PRODUCTS SO NO COUGH DROPS  USE SUGARLESS CANDY INSTEAD (jolley ranchers or Stover's)  NO OIL BASED VITAMINS - use powdered substitutes.  Try prilosec 20mg   Take 30-60 min before first meal of the day and Pepcid 20 mg one bedtime until cough is completely gone for at least a week without the need for cough suppression  I think of reflux for chronic cough like I do oxygen for fire (doesn't cause the fire but once you get the oxygen suppressed it usually goes away regardless of the exact cause).   Please see patient coordinator before you leave today  to schedule ct of sinuses  Please remember to go to the  x-ray department downstairs for your tests - we will call you with the results when they are available.     Prednisone 10 mg take  4 each am x 2 days,   2 each am x 2 days,  1 each am x2days and stop  Augmentin twice daily x 10 days   Please schedule a follow up office visit in 2 weeks, sooner if needed  With meds in hand

## 2011-01-22 NOTE — Progress Notes (Deleted)
Subjective:     Patient ID: [name], female   DOB: 10-Jan-1941, 71 y.o.   MRN: [id number]  HPI   Review of Systems     Objective:   Physical Exam     Assessment:         Plan:

## 2011-01-24 NOTE — Assessment & Plan Note (Signed)
The most common causes of chronic cough in immunocompetent adults include the following: upper airway cough syndrome (UACS), previously referred to as postnasal drip syndrome (PNDS), which is caused by variety of rhinosinus conditions; (2) asthma; (3) GERD; (4) chronic bronchitis from cigarette smoking or other inhaled environmental irritants; (5) nonasthmatic eosinophilic bronchitis; and (6) bronchiectasis.   These conditions, singly or in combination, have accounted for up to 94% of the causes of chronic cough in prospective studies.   Other conditions have constituted no >6% of the causes in prospective studies These have included bronchogenic carcinoma, chronic interstitial pneumonia, sarcoidosis, left ventricular failure, ACEI-induced cough, and aspiration from a condition associated with pharyngeal dysfunction.   .Chronic cough is often simultaneously caused by more than one condition. A single cause has been found from 38 to 82% of the time, multiple causes from 18 to 62%. Multiply caused cough has been the result of three diseases up to 42% of the time.      This is most c/w  Classic Upper airway cough syndrome, so named because it's frequently impossible to sort out how much is  CR/sinusitis with freq throat clearing (which can be related to primary GERD)   vs  causing  secondary (" extra esophageal")  GERD from wide swings in gastric pressure that occur with throat clearing, often  promoting self use of mint and menthol lozenges that reduce the lower esophageal sphincter tone and exacerbate the problem further in a cyclical fashion.   These are the same pts who not infrequently have failed to tolerate ace inhibitors,  dry powder inhalers or biphosphonates or report having reflux symptoms that don't respond to standard doses of PPI , and are easily confused as having aecopd or asthma flares.  For now max rx of gerd,  rx as chronic sinusitis with augmentin, and regroup in 2 weeks pending  results of sinus ct

## 2011-01-25 ENCOUNTER — Ambulatory Visit (INDEPENDENT_AMBULATORY_CARE_PROVIDER_SITE_OTHER)
Admission: RE | Admit: 2011-01-25 | Discharge: 2011-01-25 | Disposition: A | Discharge status: Home / Self Care | Payer: Medicare Other | Source: Ambulatory Visit | Attending: Internal Medicine | Admitting: Internal Medicine

## 2011-01-25 DIAGNOSIS — R05 Cough: Secondary | ICD-10-CM

## 2011-01-25 NOTE — Progress Notes (Signed)
Quick Note:  Spoke with pt and notified of results per Dr. Wert. Pt verbalized understanding and denied any questions.  ______ 

## 2011-01-26 ENCOUNTER — Encounter: Payer: Self-pay | Admitting: Internal Medicine

## 2011-02-10 ENCOUNTER — Encounter: Payer: Self-pay | Admitting: Internal Medicine

## 2011-02-10 ENCOUNTER — Ambulatory Visit (INDEPENDENT_AMBULATORY_CARE_PROVIDER_SITE_OTHER): Payer: Medicare Other | Admitting: Internal Medicine

## 2011-02-10 VITALS — BP 130/70 | HR 82 | Temp 98.1°F | Ht 62.0 in | Wt 176.6 lb

## 2011-02-10 DIAGNOSIS — R05 Cough: Secondary | ICD-10-CM

## 2011-02-10 NOTE — Progress Notes (Signed)
  Subjective:    Patient ID: [name], female    DOB: [birth year]   MRN: [id number]  HPI  Brief patient profile:  70 yowf quit smoking 1975 with recurrent bronchitis resolved x one episode where saw Dr Maple Hudson dx  mild asthma no maint treatment referred back to pulmonary clinic 01/22/2011 by Dr Renne Crigler for cough  01/22/2011 1st pulmonary eval in EMR era cc lump in throat/ congestion with yellow mucus esp in afternoons acute onset September felt like a cold  With persistent nasal drainage which is clear. rx with zpak x 3 and avelox and prednisone  rec Stop fish oil GERD Try prilosec 20mg   Take 30-60 min before first meal of the day and Pepcid 20 mg one bedtime  Please see patient coordinator before you leave today  to schedule ct of sinuses> ok Prednisone 10 mg take  4 each am x 2 days,   2 each am x 2 days,  1 each am x2days and stop  Augmentin twice daily x 10 days Please schedule a follow up office visit in 2 weeks, sooner if needed  With meds in hand  02/10/2011 f/u ov/[name] no meds in hand but feeling much better, no longer coughing at all, no sob.     Also denies any obvious fluctuation of symptoms with weather or environmental changes or other aggravating or alleviating factors except as outlined above   ROS  At present neg for  any significant sore throat, dysphagia, itching, sneezing,  nasal congestion or excess/ purulent secretions,  fever, chills, sweats, unintended wt loss, pleuritic or exertional cp, hempoptysis, orthopnea pnd or leg swelling.  Also denies presyncope, palpitations, heartburn, abdominal pain, nausea, vomiting, diarrhea  or change in bowel or urinary habits, dysuria,hematuria,  rash, arthralgias, visual complaints, headache, numbness weakness or ataxia.            Objective:   Physical Exam  Ambulatory wf nad   01/22/2011 wt 171 > 02/10/2011  176  HEENT: nl dentition, turbinates, and orophanx. Nl external ear canals without cough reflex   NECK :  without  JVD/Nodes/TM/ nl carotid upstrokes bilaterally   LUNGS: no acc muscle use, clear to A and P bilaterally without cough on insp or exp maneuvers   CV:  RRR  no s3 or murmur or increase in P2, no edema   ABD:  soft and nontender with nl excursion in the supine position. No bruits or organomegaly, bowel sounds nl  MS:  warm without deformities, calf tenderness, cyanosis or clubbing      CXR  01/22/2011 :   Cardiomegaly without acute disease.       Assessment & Plan:

## 2011-02-10 NOTE — Patient Instructions (Signed)
At the onset of cough be sure to start immediately using prilosec before bfast, pepcid 20mg  at bedtime and jolly ranchers and delsym and return here asap.

## 2011-02-20 ENCOUNTER — Encounter: Payer: Self-pay | Admitting: Internal Medicine

## 2011-02-20 NOTE — Assessment & Plan Note (Signed)
-   Sinus ct ordered 01/25/11 >nl  Resolved with max gerd rx and elimination of cyclical cough  Explained natural history of uri and why it's necessary in patients at risk to treat GERD aggressively  at least  short term   to reduce risk of evolving cyclical cough initially  triggered by epithelial injury and a heightened sensitivty to the effects of any upper airway irritants,  most importantly acid - related.  That is, the more sensitive the epithelium damaged for virus, the more the cough, the more the secondary reflux (especially in those prone to reflux) the more the irritation of the sensitive mucosa and so on in a cyclical pattern.   Pulmonary f/u can be prn

## 2011-03-19 DIAGNOSIS — I219 Acute myocardial infarction, unspecified: Secondary | ICD-10-CM

## 2011-03-19 HISTORY — DX: Acute myocardial infarction, unspecified: I21.9

## 2011-03-26 ENCOUNTER — Ambulatory Visit: Payer: Medicare Other

## 2011-03-29 ENCOUNTER — Other Ambulatory Visit: Payer: Self-pay

## 2011-03-29 ENCOUNTER — Encounter (HOSPITAL_COMMUNITY): Payer: Self-pay | Admitting: *Deleted

## 2011-03-29 ENCOUNTER — Inpatient Hospital Stay (HOSPITAL_COMMUNITY)
Admission: AD | Admit: 2011-03-29 | Discharge: 2011-04-02 | DRG: 377 | Disposition: A | Discharge status: Home / Self Care | Payer: Medicare Other | Attending: Internal Medicine | Admitting: Internal Medicine

## 2011-03-29 ENCOUNTER — Emergency Department (HOSPITAL_COMMUNITY): Payer: Medicare Other

## 2011-03-29 DIAGNOSIS — E785 Hyperlipidemia, unspecified: Secondary | ICD-10-CM | POA: Diagnosis present

## 2011-03-29 DIAGNOSIS — Q393 Congenital stenosis and stricture of esophagus: Secondary | ICD-10-CM

## 2011-03-29 DIAGNOSIS — I2589 Other forms of chronic ischemic heart disease: Secondary | ICD-10-CM | POA: Diagnosis present

## 2011-03-29 DIAGNOSIS — L03119 Cellulitis of unspecified part of limb: Secondary | ICD-10-CM | POA: Diagnosis present

## 2011-03-29 DIAGNOSIS — Z87891 Personal history of nicotine dependence: Secondary | ICD-10-CM

## 2011-03-29 DIAGNOSIS — I214 Non-ST elevation (NSTEMI) myocardial infarction: Secondary | ICD-10-CM | POA: Diagnosis present

## 2011-03-29 DIAGNOSIS — K254 Chronic or unspecified gastric ulcer with hemorrhage: Principal | ICD-10-CM | POA: Diagnosis present

## 2011-03-29 DIAGNOSIS — K922 Gastrointestinal hemorrhage, unspecified: Secondary | ICD-10-CM

## 2011-03-29 DIAGNOSIS — Q391 Atresia of esophagus with tracheo-esophageal fistula: Secondary | ICD-10-CM

## 2011-03-29 DIAGNOSIS — I10 Essential (primary) hypertension: Secondary | ICD-10-CM | POA: Diagnosis present

## 2011-03-29 DIAGNOSIS — N289 Disorder of kidney and ureter, unspecified: Secondary | ICD-10-CM | POA: Diagnosis present

## 2011-03-29 DIAGNOSIS — Z7901 Long term (current) use of anticoagulants: Secondary | ICD-10-CM

## 2011-03-29 DIAGNOSIS — L02419 Cutaneous abscess of limb, unspecified: Secondary | ICD-10-CM | POA: Diagnosis present

## 2011-03-29 DIAGNOSIS — D62 Acute posthemorrhagic anemia: Secondary | ICD-10-CM | POA: Diagnosis present

## 2011-03-29 DIAGNOSIS — Z951 Presence of aortocoronary bypass graft: Secondary | ICD-10-CM

## 2011-03-29 DIAGNOSIS — Z66 Do not resuscitate: Secondary | ICD-10-CM | POA: Diagnosis present

## 2011-03-29 DIAGNOSIS — Z952 Presence of prosthetic heart valve: Secondary | ICD-10-CM

## 2011-03-29 DIAGNOSIS — Z8719 Personal history of other diseases of the digestive system: Secondary | ICD-10-CM | POA: Diagnosis present

## 2011-03-29 DIAGNOSIS — Z954 Presence of other heart-valve replacement: Secondary | ICD-10-CM

## 2011-03-29 DIAGNOSIS — I2510 Atherosclerotic heart disease of native coronary artery without angina pectoris: Secondary | ICD-10-CM | POA: Diagnosis present

## 2011-03-29 DIAGNOSIS — E119 Type 2 diabetes mellitus without complications: Secondary | ICD-10-CM | POA: Diagnosis present

## 2011-03-29 DIAGNOSIS — K259 Gastric ulcer, unspecified as acute or chronic, without hemorrhage or perforation: Secondary | ICD-10-CM

## 2011-03-29 DIAGNOSIS — L03115 Cellulitis of right lower limb: Secondary | ICD-10-CM | POA: Diagnosis present

## 2011-03-29 DIAGNOSIS — Z9861 Coronary angioplasty status: Secondary | ICD-10-CM

## 2011-03-29 HISTORY — DX: Atherosclerotic heart disease of native coronary artery without angina pectoris: I25.10

## 2011-03-29 HISTORY — DX: Chronic obstructive pulmonary disease, unspecified: J44.9

## 2011-03-29 LAB — POCT I-STAT TROPONIN I

## 2011-03-29 LAB — COMPREHENSIVE METABOLIC PANEL
AST: 26 U/L (ref 0–37)
Albumin: 3.6 g/dL (ref 3.5–5.2)
CO2: 28 mEq/L (ref 19–32)
Calcium: 9.5 mg/dL (ref 8.4–10.5)
Creatinine, Ser: 1.48 mg/dL — ABNORMAL HIGH (ref 0.50–1.10)
GFR calc non Af Amer: 35 mL/min — ABNORMAL LOW (ref >90)
Sodium: 136 mEq/L (ref 135–145)
Total Protein: 6.3 g/dL (ref 6.0–8.3)

## 2011-03-29 LAB — CBC
MCH: 31.8 pg (ref 26.0–34.0)
MCHC: 31.6 g/dL (ref 30.0–36.0)
MCV: 100.8 fL — ABNORMAL HIGH (ref 78.0–100.0)
Platelets: 331 10*3/uL (ref 150–400)
RDW: 14.7 % (ref 11.5–15.5)

## 2011-03-29 LAB — PROTIME-INR: INR: 3.58 — ABNORMAL HIGH (ref 0.00–1.49)

## 2011-03-29 LAB — PREPARE RBC (CROSSMATCH)

## 2011-03-29 LAB — APTT: aPTT: 44 seconds — ABNORMAL HIGH (ref 24–37)

## 2011-03-29 MED ORDER — NITROGLYCERIN 0.4 MG SL SUBL: 0.4000 mg | SUBLINGUAL_TABLET | SUBLINGUAL | Status: DC | PRN

## 2011-03-29 MED ORDER — NITROGLYCERIN 2 % TD OINT: 1.0000 [in_us] | TOPICAL_OINTMENT | Freq: Four times a day (QID) | TRANSDERMAL | Status: DC

## 2011-03-29 MED ORDER — SODIUM CHLORIDE 0.9 % IV SOLN: 20.0000 mL | INTRAVENOUS | Status: DC

## 2011-03-29 NOTE — ED Notes (Signed)
2928-01 Ready 

## 2011-03-29 NOTE — ED Notes (Signed)
Pt has multiple complaints. States she was told by her daughter that she looked jaundice. States she was put on abx last week for 'place on my leg'. States she also has had episodes of cp over the past cple days. Nitro relieved pain. Mild pain at present

## 2011-03-29 NOTE — ED Notes (Signed)
Report received, care assumed.

## 2011-03-29 NOTE — ED Notes (Signed)
Family in [place] 

## 2011-03-29 NOTE — ED Notes (Signed)
Critical lab results was given to EDP. 

## 2011-03-29 NOTE — ED Provider Notes (Signed)
History     CSN: [id number]  Arrival date & time 03/29/11  1820   First MD Initiated Contact with Patient 03/29/11 1844      Chief Complaint  Patient presents with  . Chest Pain    (Consider location/radiation/quality/duration/timing/severity/associated sxs/prior treatment) HPI Pt was doing laundry today around 4:!5 when she experienced acute chest pain.  The pain was a pressure all over her chest.  It has gotten better after NTG.  Pt denies fever, cough or nausea.       Pt has history of CAD.  This feels similar to that.  Her last procedure was one year ago.  She has had dark black stools for 2 days.   Pt had been on antibiotics and thinks it could be related to that.  Past Medical History  Diagnosis Date  . High blood pressure   . Angina at rest   . Irregular heart rhythm   . Diabetes mellitus   . High cholesterol     Past Surgical History  Procedure Date  . Gallbadder 2003  . Cardiac valve replacement 1991  . Heart bypass 1991/2005  . Angioplasty     Family History  Problem Relation Age of Onset  . Rheum arthritis Mother   . Rheum arthritis Father   . Rheum arthritis Paternal Aunt   . Cervical cancer Mother   . Stomach cancer Mother     History  Substance Use Topics  . Smoking status: Former Smoker -- 0.5 packs/day for 15 years    Types: Cigarettes    Quit date: 01/18/1973  . Smokeless tobacco: Never Used  . Alcohol Use: No    OB History    Grav Para Term Preterm Abortions TAB SAB Ect Mult Living                  Review of Systems  All other systems reviewed and are negative.    Allergies  Codeine  Home Medications   Current Outpatient Rx  Name Route Sig Dispense Refill  . ASPIRIN 81 MG PO TABS Oral Take 81 mg by mouth daily.      . ATORVASTATIN CALCIUM 40 MG PO TABS Oral Take 40 mg by mouth daily.      Marland Kitchen CALCIUM CARBONATE 600 MG PO TABS Oral Take 600 mg by mouth 2 (two) times daily.      . DULOXETINE HCL 60 MG PO CPEP Oral Take 60 mg by  mouth daily.      Marland Kitchen FAMOTIDINE 20 MG PO TABS Oral Take 20 mg by mouth at bedtime.    . FENOFIBRATE 54 MG PO TABS Oral Take 54 mg by mouth daily.      . FUROSEMIDE 40 MG PO TABS Oral Take 40 mg by mouth 2 (two) times daily.      Marland Kitchen GLIMEPIRIDE 1 MG PO TABS Oral Take 1 mg by mouth daily.      Marland Kitchen HYDROCODONE-ACETAMINOPHEN 7.5-325 MG PO TABS Oral Take 1 tablet by mouth at bedtime as needed.      . ISOSORBIDE MONONITRATE ER 30 MG PO TB24  1/2 tablet daily    . LOSARTAN POTASSIUM PO Oral Take 100 mg by mouth daily.      . MULTIVITAMINS PO CAPS Oral Take 1 capsule by mouth daily.    Marland Kitchen OMEPRAZOLE MAGNESIUM 20 MG PO TBEC Oral Take 20 mg by mouth daily before breakfast.    . POTASSIUM CHLORIDE PO Oral Take 20 mEq by mouth. Monday, Wednesday, Friday     .  TOPROL XL 50 MG PO TB24 Oral Take 1 tablet by mouth 2 (two) times daily.    Marland Kitchen VITAMIN B-12 1000 MCG PO TABS Oral Take 1,000 mcg by mouth 2 (two) times daily.      . WARFARIN SODIUM 2.5 MG PO TABS Oral Take 2.5 mg by mouth daily.        BP 116/34  Pulse 73  Temp(Src) 98.4 F (36.9 C) (Oral)  Resp 16  SpO2 98%  Physical Exam  Nursing note and vitals reviewed. Constitutional: She appears well-developed and well-nourished. No distress.  HENT:  Head: Normocephalic and atraumatic.  Right Ear: External ear normal.  Left Ear: External ear normal.  Eyes: Conjunctivae are normal. Right eye exhibits no discharge. Left eye exhibits no discharge. No scleral icterus.       Conjunctiva pale  Neck: Neck supple. No tracheal deviation present.  Cardiovascular: Normal rate, regular rhythm and intact distal pulses.   Pulmonary/Chest: Effort normal and breath sounds normal. No stridor. No respiratory distress. She has no wheezes. She has no rales.  Abdominal: Soft. Bowel sounds are normal. She exhibits no distension. There is no tenderness. There is no rebound and no guarding.  Genitourinary:       No gross blood on rectal exam no melena  Musculoskeletal: She  exhibits no edema and no tenderness.  Neurological: She is alert. She has normal strength. No sensory deficit. Cranial nerve deficit:  no gross defecits noted. She exhibits normal muscle tone. She displays no seizure activity. Coordination normal.  Skin: Skin is warm and dry. No rash noted.  Psychiatric: She has a normal mood and affect.    ED Course  Procedures (including critical care time)  Date: 03/29/2011  Rate: 73  Rhythm: normal sinus rhythm  QRS Axis: normal  Intervals: normal  ST/T Wave abnormalities: ST depressions diffusely  Conduction Disutrbances:none  Narrative Interpretation: Left ventricular hypertrophy with repolarization abnormality  Old EKG Reviewed: changes noted ST depressions inferiorly and laterally seem to be increase   Labs Reviewed  CBC - Abnormal; Notable for the following:    RBC 2.45 (*)    Hemoglobin 7.8 (*)    HCT 24.7 (*)    MCV 100.8 (*)    All other components within normal limits  COMPREHENSIVE METABOLIC PANEL - Abnormal; Notable for the following:    Glucose, Bld 208 (*)    BUN 30 (*)    Creatinine, Ser 1.48 (*)    Alkaline Phosphatase 31 (*)    GFR calc non Af Amer 35 (*)    GFR calc Af Amer 40 (*)    All other components within normal limits  PROTIME-INR - Abnormal; Notable for the following:    Prothrombin Time 36.3 (*)    INR 3.58 (*)    All other components within normal limits  APTT - Abnormal; Notable for the following:    aPTT 44 (*)    All other components within normal limits  POCT I-STAT TROPONIN I - Abnormal; Notable for the following:    Troponin i, poc 0.33 (*)    All other components within normal limits  TYPE AND SCREEN  OCCULT BLOOD, POC DEVICE  ABO/RH  PREPARE RBC (CROSSMATCH)   Dg Chest Portable 1 View  03/29/2011  *RADIOLOGY REPORT*  Clinical Data: Chest pain  PORTABLE CHEST - 1 VIEW  Comparison: 01/22/2011  Findings: Cardiomegaly again noted.  Status post CABG.  No acute infiltrate or pleural effusion.  No  pulmonary edema.  Bony thorax  is stable.  IMPRESSION: Cardiomegaly again noted.  Status post CABG.  No active disease.  Original Report Authenticated By: Natasha Mead, M.D.     1. GI bleed   2. Chest pain       MDM  Patient appears to have a GI bleed. Her anemia is worse than her baseline. I suspect this is a component of her chest pain.  I have ordered blood transfusions.    Patient does not have chest pain at this time. I will consult her cardiologist considering her history of coronary artery disease and elevated cardiac enzymes. At this time patient does remain stable in the emergency department without hypotension or recurrent pain.  CRITICAL CARE Performed by: Celene Kras   Total critical care time: 35  Critical care time was exclusive of separately billable procedures and treating other patients.  Critical care was necessary to treat or prevent imminent or life-threatening deterioration.  Critical care was time spent personally by me on the following activities: development of treatment plan with patient and/or surrogate as well as nursing, discussions with consultants, evaluation of patient's response to treatment, examination of patient, obtaining history from patient or surrogate, ordering and performing treatments and interventions, ordering and review of laboratory studies, ordering and review of radiographic studies, pulse oximetry and re-evaluation of patient's condition.         Celene Kras, MD 03/29/11 [phone]

## 2011-03-30 ENCOUNTER — Encounter (HOSPITAL_COMMUNITY): Payer: Self-pay | Admitting: Internal Medicine

## 2011-03-30 DIAGNOSIS — I10 Essential (primary) hypertension: Secondary | ICD-10-CM | POA: Diagnosis present

## 2011-03-30 DIAGNOSIS — I2510 Atherosclerotic heart disease of native coronary artery without angina pectoris: Secondary | ICD-10-CM

## 2011-03-30 DIAGNOSIS — I519 Heart disease, unspecified: Secondary | ICD-10-CM | POA: Insufficient documentation

## 2011-03-30 DIAGNOSIS — D62 Acute posthemorrhagic anemia: Secondary | ICD-10-CM

## 2011-03-30 DIAGNOSIS — Z8719 Personal history of other diseases of the digestive system: Secondary | ICD-10-CM | POA: Diagnosis present

## 2011-03-30 DIAGNOSIS — Z952 Presence of prosthetic heart valve: Secondary | ICD-10-CM

## 2011-03-30 DIAGNOSIS — Z7901 Long term (current) use of anticoagulants: Secondary | ICD-10-CM

## 2011-03-30 DIAGNOSIS — E785 Hyperlipidemia, unspecified: Secondary | ICD-10-CM | POA: Diagnosis present

## 2011-03-30 DIAGNOSIS — K259 Gastric ulcer, unspecified as acute or chronic, without hemorrhage or perforation: Secondary | ICD-10-CM

## 2011-03-30 DIAGNOSIS — I214 Non-ST elevation (NSTEMI) myocardial infarction: Secondary | ICD-10-CM | POA: Diagnosis present

## 2011-03-30 DIAGNOSIS — I5189 Other ill-defined heart diseases: Secondary | ICD-10-CM | POA: Insufficient documentation

## 2011-03-30 DIAGNOSIS — Z954 Presence of other heart-valve replacement: Secondary | ICD-10-CM

## 2011-03-30 DIAGNOSIS — L03115 Cellulitis of right lower limb: Secondary | ICD-10-CM | POA: Diagnosis present

## 2011-03-30 DIAGNOSIS — K922 Gastrointestinal hemorrhage, unspecified: Secondary | ICD-10-CM

## 2011-03-30 LAB — CARDIAC PANEL(CRET KIN+CKTOT+MB+TROPI)
CK, MB: 2.9 ng/mL (ref 0.3–4.0)
CK, MB: 2.3 ng/mL (ref 0.3–4.0)
Relative Index: 2 (ref 0.0–2.5)
Total CK: 128 U/L (ref 7–177)
Troponin I: 0.71 ng/mL (ref <0.30)
Troponin I: 0.43 ng/mL (ref <0.30)

## 2011-03-30 LAB — TYPE AND SCREEN
ABO/RH(D): A POS
Unit division: 0

## 2011-03-30 LAB — COMPREHENSIVE METABOLIC PANEL
Alkaline Phosphatase: 34 U/L — ABNORMAL LOW (ref 39–117)
BUN: 26 mg/dL — ABNORMAL HIGH (ref 6–23)
CO2: 28 mEq/L (ref 19–32)
Calcium: 9.3 mg/dL (ref 8.4–10.5)
GFR calc Af Amer: 52 mL/min — ABNORMAL LOW (ref >90)
GFR calc non Af Amer: 45 mL/min — ABNORMAL LOW (ref >90)
Glucose, Bld: 171 mg/dL — ABNORMAL HIGH (ref 70–99)
Total Protein: 6 g/dL (ref 6.0–8.3)

## 2011-03-30 LAB — CBC
HCT: 33.2 % — ABNORMAL LOW (ref 36.0–46.0)
Hemoglobin: 10.7 g/dL — ABNORMAL LOW (ref 12.0–15.0)
MCH: 29.9 pg (ref 26.0–34.0)
MCHC: 32.2 g/dL (ref 30.0–36.0)
RBC: 3.58 MIL/uL — ABNORMAL LOW (ref 3.87–5.11)

## 2011-03-30 LAB — PROTIME-INR: Prothrombin Time: 29.5 seconds — ABNORMAL HIGH (ref 11.6–15.2)

## 2011-03-30 LAB — GLUCOSE, CAPILLARY
Glucose-Capillary: 157 mg/dL — ABNORMAL HIGH (ref 70–99)
Glucose-Capillary: 169 mg/dL — ABNORMAL HIGH (ref 70–99)

## 2011-03-30 MED ORDER — ATORVASTATIN CALCIUM 40 MG PO TABS
40.0000 mg | ORAL_TABLET | Freq: Every day | ORAL | Status: DC
  Administered 2011-03-30 – 2011-04-01 (×3): 40 mg via ORAL
  Filled 2011-03-30 (×4): qty 1

## 2011-03-30 MED ORDER — ISOSORBIDE MONONITRATE ER 30 MG PO TB24
30.0000 mg | ORAL_TABLET | Freq: Every day | ORAL | Status: DC
  Administered 2011-03-31 – 2011-04-02 (×3): 30 mg via ORAL
  Filled 2011-03-30 (×3): qty 1

## 2011-03-30 MED ORDER — FUROSEMIDE 10 MG/ML IJ SOLN
20.0000 mg | Freq: Once | INTRAMUSCULAR | Status: AC
  Administered 2011-03-30: 20 mg via INTRAVENOUS
  Filled 2011-03-30 (×2): qty 2

## 2011-03-30 MED ORDER — TEMAZEPAM 15 MG PO CAPS: 30.0000 mg | ORAL_CAPSULE | Freq: Every evening | ORAL | Status: DC | PRN

## 2011-03-30 MED ORDER — ONDANSETRON HCL 4 MG/2ML IJ SOLN: 4.0000 mg | Freq: Four times a day (QID) | INTRAMUSCULAR | Status: DC | PRN

## 2011-03-30 MED ORDER — SODIUM CHLORIDE 0.9 % IJ SOLN
3.0000 mL | Freq: Two times a day (BID) | INTRAMUSCULAR | Status: DC
  Administered 2011-03-30 – 2011-04-02 (×5): 3 mL via INTRAVENOUS

## 2011-03-30 MED ORDER — SODIUM CHLORIDE 0.9 % IV SOLN
INTRAVENOUS | Status: DC
  Administered 2011-03-30: 250 mL via INTRAVENOUS

## 2011-03-30 MED ORDER — ACETAMINOPHEN 650 MG RE SUPP: 650.0000 mg | Freq: Four times a day (QID) | RECTAL | Status: DC | PRN

## 2011-03-30 MED ORDER — FENOFIBRATE 54 MG PO TABS
54.0000 mg | ORAL_TABLET | Freq: Every day | ORAL | Status: DC
  Administered 2011-03-30 – 2011-03-31 (×2): 54 mg via ORAL
  Filled 2011-03-30 (×3): qty 1

## 2011-03-30 MED ORDER — DOXYCYCLINE HYCLATE 100 MG IV SOLR
100.0000 mg | Freq: Two times a day (BID) | INTRAVENOUS | Status: DC
  Administered 2011-03-30 – 2011-03-31 (×4): 100 mg via INTRAVENOUS
  Filled 2011-03-30 (×7): qty 100

## 2011-03-30 MED ORDER — ATORVASTATIN CALCIUM 40 MG PO TABS
40.0000 mg | ORAL_TABLET | Freq: Every day | ORAL | Status: DC
  Filled 2011-03-30: qty 1

## 2011-03-30 MED ORDER — FENOFIBRATE 54 MG PO TABS
54.0000 mg | ORAL_TABLET | Freq: Every day | ORAL | Status: DC
  Filled 2011-03-30: qty 1

## 2011-03-30 MED ORDER — DULOXETINE HCL 60 MG PO CPEP
60.0000 mg | ORAL_CAPSULE | Freq: Every day | ORAL | Status: DC
  Administered 2011-03-30 – 2011-04-02 (×4): 60 mg via ORAL
  Filled 2011-03-30 (×4): qty 1

## 2011-03-30 MED ORDER — SODIUM CHLORIDE 0.9 % IV SOLN
8.0000 mg/h | INTRAVENOUS | Status: DC
  Administered 2011-03-30 – 2011-03-31 (×3): 8 mg/h via INTRAVENOUS
  Filled 2011-03-30 (×8): qty 80

## 2011-03-30 MED ORDER — ZOLPIDEM TARTRATE 5 MG PO TABS: 5.0000 mg | ORAL_TABLET | Freq: Every evening | ORAL | Status: DC | PRN

## 2011-03-30 MED ORDER — ONDANSETRON HCL 4 MG PO TABS: 4.0000 mg | ORAL_TABLET | Freq: Four times a day (QID) | ORAL | Status: DC | PRN

## 2011-03-30 MED ORDER — ISOSORBIDE MONONITRATE 15 MG HALF TABLET
15.0000 mg | ORAL_TABLET | Freq: Every day | ORAL | Status: DC
  Administered 2011-03-30: 15 mg via ORAL
  Filled 2011-03-30: qty 1

## 2011-03-30 MED ORDER — INSULIN GLARGINE 100 UNIT/ML ~~LOC~~ SOLN
10.0000 [IU] | Freq: Every day | SUBCUTANEOUS | Status: DC
  Administered 2011-03-30 – 2011-04-01 (×3): 10 [IU] via SUBCUTANEOUS

## 2011-03-30 MED ORDER — FUROSEMIDE 10 MG/ML IJ SOLN
40.0000 mg | Freq: Once | INTRAMUSCULAR | Status: AC
  Administered 2011-03-30: 40 mg via INTRAVENOUS
  Filled 2011-03-30: qty 4

## 2011-03-30 MED ORDER — TEMAZEPAM 15 MG PO CAPS
15.0000 mg | ORAL_CAPSULE | Freq: Every evening | ORAL | Status: DC | PRN
  Administered 2011-03-30: 15 mg via ORAL
  Filled 2011-03-30: qty 1

## 2011-03-30 MED ORDER — ACETAMINOPHEN 325 MG PO TABS: 650.0000 mg | ORAL_TABLET | Freq: Four times a day (QID) | ORAL | Status: DC | PRN

## 2011-03-30 MED ORDER — HYDROCODONE-ACETAMINOPHEN 5-325 MG PO TABS
1.5000 | ORAL_TABLET | Freq: Every evening | ORAL | Status: DC | PRN
  Administered 2011-03-30 – 2011-04-01 (×4): 1.5 via ORAL
  Filled 2011-03-30 (×4): qty 2

## 2011-03-30 MED ORDER — METOPROLOL SUCCINATE ER 50 MG PO TB24
50.0000 mg | ORAL_TABLET | Freq: Every day | ORAL | Status: DC
  Administered 2011-03-30 – 2011-04-01 (×3): 50 mg via ORAL
  Filled 2011-03-30 (×3): qty 1

## 2011-03-30 NOTE — Consult Note (Signed)
Referring Provider: Dr. Fredrich Romans- [facility] Primary Care Physician:  Londell Moh, MD, MD Primary Gastroenterologist:  none  Reason for Consultation:  Melena and profound anemia  HPI: [name] is a 71 y.o. female with multiple medical problems including history of coronary artery disease for which she is status post CABG and redo CABG in 2006. She also has history of coronary stenting and is status post non-ST MI in 2011. She also has history of mechanical aortic valve replacement and is maintained on chronic Coumadin. She has history of diabetes and hypertension. She was admitted last night through the emergency room with complaints of chest pain and shortness of breath. She had taken a nitroglycerin at home which relieved her chest pain but then it recurred and she came onto the emergency room. In the ER she was found to have an elevated troponin and also a hemoglobin in the 7 range with heme positive stool.  She has been transfused and her hemoglobin this morning is 10.7. She has not had any further complaints of chest pain. In retrospect she says she had been seeing dark stools over the past  3  days but thought this was due to an antibiotic she was taking for a lower extremity infection.I do not find any recent labs in the last hemoglobin in December of 2011 was in the 9.5-11.3 range. She has no c/o nausea, or abdominal pain.  Patient had been taking chronic Pepcid every day and is also  Prilosec once daily- these were stopped a couple months ago She is previously known to Dr. Virginia Rochester, and had upper endoscopy in 2009 which showed hemorrhagic gastritis, biopsies were taken and were benign and she also had colonoscopy at that same time positive only for internal hemorrhoids. She had random colon biopsies taken because of complaints of diarrhea and these were also benign.  Patient has been seen by cardiology this admission and felt to have had an acute non-ST MI. There does not appear to  be any intervention planned and holding her Coumadin is acceptable. INR on admission was 3.58 and is down to 2.75 today.   Past Medical History  Diagnosis Date  . High blood pressure   . Angina at rest   . Irregular heart rhythm   . Diabetes mellitus   . High cholesterol   . Coronary artery disease     Past Surgical History  Procedure Date  . Gallbadder 2003  . Cardiac valve replacement 1991  . Heart bypass 1991/2005  . Angioplasty   . Coronary artery bypass graft   . Aortic valve replacement   . Appendectomy   . Abdominal hysterectomy     Prior to Admission medications   Medication Sig Start Date End Date Taking? Authorizing Provider  aspirin 81 MG tablet Take 81 mg by mouth daily.     Yes Historical Provider, MD  atorvastatin (LIPITOR) 40 MG tablet Take 40 mg by mouth daily.     Yes Historical Provider, MD  calcium carbonate (OS-CAL) 600 MG TABS Take 600 mg by mouth 2 (two) times daily.     Yes Historical Provider, MD  DULoxetine (CYMBALTA) 60 MG capsule Take 60 mg by mouth daily.     Yes Historical Provider, MD  famotidine (PEPCID) 20 MG tablet Take 20 mg by mouth at bedtime.   Yes Historical Provider, MD  fenofibrate 54 MG tablet Take 54 mg by mouth daily.     Yes Historical Provider, MD  furosemide (LASIX) 40 MG tablet Take  40 mg by mouth 2 (two) times daily.     Yes Historical Provider, MD  glimepiride (AMARYL) 1 MG tablet Take 1 mg by mouth daily.     Yes Historical Provider, MD  HYDROcodone-acetaminophen (NORCO) 7.5-325 MG per tablet Take 1 tablet by mouth at bedtime as needed. For pain   Yes Historical Provider, MD  isosorbide mononitrate (IMDUR) 30 MG 24 hr tablet Take 15 mg by mouth daily.  01/07/11  Yes Historical Provider, MD  LOSARTAN POTASSIUM PO Take 100 mg by mouth daily.     Yes Historical Provider, MD  Multiple Vitamin (MULTIVITAMIN) capsule Take 1 capsule by mouth daily.   Yes Historical Provider, MD  omeprazole (PRILOSEC OTC) 20 MG tablet Take 20 mg by  mouth daily before breakfast.   Yes Historical Provider, MD  POTASSIUM CHLORIDE PO Take 20 mEq by mouth. Monday, Wednesday, Friday    Yes Historical Provider, MD  temazepam (RESTORIL) 30 MG capsule Take 30 mg by mouth at bedtime as needed.   Yes Historical Provider, MD  TOPROL XL 50 MG 24 hr tablet Take 1 tablet by mouth daily.  11/24/10  Yes Historical Provider, MD  vitamin B-12 (CYANOCOBALAMIN) 1000 MCG tablet Take 1,000 mcg by mouth 2 (two) times daily.     Yes Historical Provider, MD  warfarin (COUMADIN) 2.5 MG tablet Take 2.5 mg by mouth daily.     Yes Historical Provider, MD    Current Facility-Administered Medications  Medication Dose Route Frequency Provider Last Rate Last Dose  . 0.9 %  sodium chloride infusion   Intravenous Continuous Eduard Clos, MD 10 mL/hr at 03/30/11 0242 250 mL at 03/30/11 0242  . acetaminophen (TYLENOL) tablet 650 mg  650 mg Oral Q6H PRN Eduard Clos, MD       Or  . acetaminophen (TYLENOL) suppository 650 mg  650 mg Rectal Q6H PRN Eduard Clos, MD      . atorvastatin (LIPITOR) tablet 40 mg  40 mg Oral q1800 Lonia Blood, MD      . doxycycline (VIBRAMYCIN) 100 mg in dextrose 5 % 250 mL IVPB  100 mg Intravenous BID Eduard Clos, MD   100 mg at 03/30/11 1006  . DULoxetine (CYMBALTA) DR capsule 60 mg  60 mg Oral Daily Eduard Clos, MD   60 mg at 03/30/11 1006  . fenofibrate tablet 54 mg  54 mg Oral Daily Lonia Blood, MD      . furosemide (LASIX) injection 20 mg  20 mg Intravenous Once Caroline More, NP   20 mg at 03/30/11 0209  . furosemide (LASIX) injection 40 mg  40 mg Intravenous Once Abelino Derrick, [place]      . HYDROcodone-acetaminophen (NORCO) 5-325 MG per tablet 1.5 tablet  1.5 tablet Oral QHS PRN Eduard Clos, MD   1.5 tablet at 03/30/11 0143  . insulin glargine (LANTUS) injection 10 Units  10 Units Subcutaneous QHS Eda Paschal [place], [place]      . isosorbide mononitrate (IMDUR) 24 hr tablet 30 mg  30 mg Oral Daily  Eda Paschal [place], [place]      . metoprolol succinate (TOPROL-XL) 24 hr tablet 50 mg  50 mg Oral Daily Eduard Clos, MD   50 mg at 03/30/11 1005  . ondansetron (ZOFRAN) tablet 4 mg  4 mg Oral Q6H PRN Eduard Clos, MD       Or  . ondansetron [facility]) injection 4 mg  4 mg Intravenous Q6H  PRN Eduard Clos, MD      . pantoprazole (PROTONIX) 80 mg in sodium chloride 0.9 % 250 mL infusion  8 mg/hr Intravenous Continuous Eduard Clos, MD 25 mL/hr at 03/30/11 0243 8 mg/hr at 03/30/11 0243  . sodium chloride 0.9 % injection 3 mL  3 mL Intravenous Q12H Eduard Clos, MD      . zolpidem (AMBIEN) tablet 5 mg  5 mg Oral QHS PRN Lonia Blood, MD      . DISCONTD: 0.9 %  sodium chloride infusion  20 mL Intravenous Continuous Celene Kras, MD      . DISCONTD: atorvastatin (LIPITOR) tablet 40 mg  40 mg Oral Daily Eduard Clos, MD      . DISCONTD: fenofibrate tablet 54 mg  54 mg Oral Daily Eduard Clos, MD      . DISCONTD: isosorbide mononitrate (IMDUR) 24 hr tablet 15 mg  15 mg Oral Daily Eduard Clos, MD   15 mg at 03/30/11 1005  . DISCONTD: nitroGLYCERIN (NITROGLYN) 2 % ointment 1 inch  1 inch Topical Q6H Celene Kras, MD      . DISCONTD: nitroGLYCERIN (NITROSTAT) SL tablet 0.4 mg  0.4 mg Sublingual Q5 min PRN Celene Kras, MD      . DISCONTD: temazepam (RESTORIL) capsule 30 mg  30 mg Oral QHS PRN Eduard Clos, MD        Allergies as of 03/29/2011 - Review Complete 03/29/2011  Allergen Reaction Noted  . Codeine  01/22/2011    Family History  Problem Relation Age of Onset  . Rheum arthritis Mother   . Rheum arthritis Father   . Rheum arthritis Paternal Aunt   . Cervical cancer Mother   . Stomach cancer Mother     History   Social History  . Marital Status: Married    Spouse Name: N/A    Number of Children: 3  . Years of Education: N/A   Occupational History  . retired     bed control at [employer]   Social History Main Topics    . Smoking status: Former Smoker -- 0.5 packs/day for 15 years    Types: Cigarettes    Quit date: 01/18/1973  . Smokeless tobacco: Never Used  . Alcohol Use: No  . Drug Use: No  . Sexually Active: Not on file   Other Topics Concern  . Not on file   Social History Narrative  . No narrative on file    Review of Systems: Pertinent positive and negative review of systems were noted in the above HPI section.  All other review of systems was otherwise negative.   Physical Exam: Vital signs in last 24 hours: Temp:  [97.1 F (36.2 C)-98.4 F (36.9 C)] 97.5 F (36.4 C) (03/12 0800) Pulse Rate:  [71-92] 92  (03/12 0300) Resp:  [15-19] 16  (03/12 0720) BP: (110-162)/(34-88) 114/44 mmHg (03/12 0720) SpO2:  [95 %-100 %] 97 % (03/12 0800) Weight:  [175 lb 11.3 oz (79.7 kg)-177 lb 0.5 oz (80.3 kg)] 177 lb 0.5 oz (80.3 kg) (03/12 0500)   General:   Alert,  Well-developed, well-nourished, pleasant and cooperative in NAD up in chair Head:  Normocephalic and atraumatic. Eyes:  Sclera clear, no icterus.   Conjunctiva pink. Ears:  Normal auditory acuity. Nose:  No deformity, discharge,  or lesions. Mouth:  No deformity or lesions.   Neck:  Supple; no masses or thyromegaly. Lungs: bibasilar rales Heart:  Regular rate and rhythm; no murmurs, mechanical valve click Abdomen:  Soft,nontender, BS active,nonpalp mass or hsm.   Rectal:  Deferred , dark heme + stool documented in er Msk:  Symmetrical without gross deformities. . Pulses:  Normal pulses noted. Extremities:  Without clubbing or edema. Neurologic:  Alert and  oriented x4;  grossly normal neurologically. Skin:  Intact without significant lesions or rashes.. Psych:  Alert and cooperative. Normal mood and affect.  Intake/Output from previous day: 03/11 0701 - 03/12 0700 In: 1620.3 [I.V.:215; Blood:1405.3] Out: 400 [Urine:400] Intake/Output this shift: Total I/O In: 355 [I.V.:105; IV Piggyback:250] Out: -   Lab  Results:  [facility] 03/30/11 0610 03/29/11 1905  WBC 12.8* 8.4  HGB 10.7* 7.8*  HCT 33.2* 24.7*  PLT 312 331   BMET  Basename 03/30/11 0610 03/29/11 1905  NA 137 136  K 4.3 4.5  CL 100 98  CO2 28 28  GLUCOSE 171* 208*  BUN 26* 30*  CREATININE 1.20* 1.48*  CALCIUM 9.3 9.5   LFT  Basename 03/30/11 0610  PROT 6.0  ALBUMIN 3.5  AST 27  ALT 16  ALKPHOS 34*  BILITOT 0.8  BILIDIR --  IBILI --   PT/INR  Basename 03/30/11 0610 03/29/11 1905  LABPROT 29.5* 36.3*  INR 2.75* 3.58*    Studies/Results: Dg Chest Portable 1 View  03/29/2011  *RADIOLOGY REPORT*  Clinical Data: Chest pain  PORTABLE CHEST - 1 VIEW  Comparison: 01/22/2011  Findings: Cardiomegaly again noted.  Status post CABG.  No acute infiltrate or pleural effusion.  No pulmonary edema.  Bony thorax is stable.  IMPRESSION: Cardiomegaly again noted.  Status post CABG.  No active disease.  Original Report Authenticated By: Natasha Mead, M.D.    IMPRESSION:  #1  71 yo female with hx of CAD ,s/p CABG, and redo CABG, STEMI 2011, and s/p AVR on chronic coumadin presenting with chest pain and SOB. Felt to have had a NSTEMI in setting of demand ischemia with hgb of 7. She is stable with no further CP. #2 Melena x 3 days- r/o recurrent hemorrhagic gastritis or PUD, no current active bleeding #3 DM #4 cardiomyopathy- [id number] #5 hx htn #6 anemia secondary to #2   PLAN:  #1 Continue protonix infusion #2 Keep hgb 9 range #3 schedule for EGD in am 3/13- to allow for further normalization of INR. #4 hold coumadin at least until post EGD. We will follow with you, thank-you for the consult   Amy Esterwood  03/30/2011, 11:45 AM

## 2011-03-30 NOTE — H&P (Addendum)
[name] is an 71 y.o. female. PCP - Dr.Walter Renne Crigler.   Cardiologist - [facility]. Chief Complaint: Chest pain and shortness of breath. HPI: 71 year-old female with history of CAD status post CABG and status post stenting, history of mechanical aortic valve replacement on Coumadin, hypertension and diabetes mellitus2 was brought in the ER because of chest pain and shortness of breath. Last evening around 4 PM patient noticed that patient developed some chest pain pressure-like retrosternal associated with some shortness of breath. Patient took some nitroglycerin which gave her some relief. The chest pain was still mildly persistent so she decided to come to the ER. In the ER patient was found to be having mildly elevated troponin and patient also was found to be anemic with hemoglobin around 7 and usually her hemoglobin is around 11. Patient's stool for occult blood was positive. Patient did state that she's been having black stools over last 2 days which she attributed to taking antibiotics recently for her right leg cellulitis. Patient denies any nausea vomiting abdominal pain or diarrhea. At this time patient will be admitted for GI bleed with positive troponins. ER physician Dr.Knapp did discuss with on-call cardiologist for [facility] heart and vascular Dr. Tresa Endo who had this time advised to keep her off the Coumadin and they'll be seeing patient in consult. Patient denies any headache visual symptoms any dizziness loss of consciousness or focal deficits. Patient denies using any NSAIDs or over-the-counter medications.  Past Medical History  Diagnosis Date  . High blood pressure   . Angina at rest   . Irregular heart rhythm   . Diabetes mellitus   . High cholesterol   . Coronary artery disease     Past Surgical History  Procedure Date  . Gallbadder 2003  . Cardiac valve replacement 1991  . Heart bypass 1991/2005  . Angioplasty   . Coronary artery bypass graft   . Aortic valve  replacement   . Appendectomy   . Abdominal hysterectomy     Family History  Problem Relation Age of Onset  . Rheum arthritis Mother   . Rheum arthritis Father   . Rheum arthritis Paternal Aunt   . Cervical cancer Mother   . Stomach cancer Mother    Social History:  reports that she quit smoking about 38 years ago. Her smoking use included Cigarettes. She has a 7.5 pack-year smoking history. She has never used smokeless tobacco. She reports that she does not drink alcohol or use illicit drugs.  Allergies:  Allergies  Allergen Reactions  . Codeine     Feels sick    Medications Prior to Admission  Medication Dose Route Frequency Provider Last Rate Last Dose  . 0.9 %  sodium chloride infusion   Intravenous Continuous Eduard Clos, MD      . acetaminophen (TYLENOL) tablet 650 mg  650 mg Oral Q6H PRN Eduard Clos, MD       Or  . acetaminophen (TYLENOL) suppository 650 mg  650 mg Rectal Q6H PRN Eduard Clos, MD      . atorvastatin (LIPITOR) tablet 40 mg  40 mg Oral Daily Eduard Clos, MD      . doxycycline (VIBRAMYCIN) 100 mg in dextrose 5 % 250 mL IVPB  100 mg Intravenous Q12H Eduard Clos, MD      . DULoxetine (CYMBALTA) DR capsule 60 mg  60 mg Oral Daily Eduard Clos, MD      . fenofibrate tablet 54 mg  54 mg Oral Daily Eduard Clos, MD      . HYDROcodone-acetaminophen (NORCO) 7.5-325 MG per tablet 1 tablet  1 tablet Oral QHS PRN Eduard Clos, MD      . insulin glargine (LANTUS) injection 10 Units  10 Units Subcutaneous QHS Eduard Clos, MD      . isosorbide mononitrate (IMDUR) 24 hr tablet 15 mg  15 mg Oral Daily Eduard Clos, MD      . metoprolol succinate (TOPROL-XL) 24 hr tablet 50 mg  50 mg Oral Daily Eduard Clos, MD      . ondansetron [facility]) tablet 4 mg  4 mg Oral Q6H PRN Eduard Clos, MD       Or  . ondansetron (ZOFRAN) injection 4 mg  4 mg Intravenous Q6H PRN Eduard Clos, MD        . pantoprazole (PROTONIX) 80 mg in sodium chloride 0.9 % 250 mL infusion  8 mg/hr Intravenous Continuous Eduard Clos, MD      . sodium chloride 0.9 % injection 3 mL  3 mL Intravenous Q12H Eduard Clos, MD      . DISCONTD: 0.9 %  sodium chloride infusion  20 mL Intravenous Continuous Celene Kras, MD      . DISCONTD: nitroGLYCERIN (NITROGLYN) 2 % ointment 1 inch  1 inch Topical Q6H Celene Kras, MD      . DISCONTD: nitroGLYCERIN (NITROSTAT) SL tablet 0.4 mg  0.4 mg Sublingual Q5 min PRN Celene Kras, MD      . DISCONTD: temazepam (RESTORIL) capsule 30 mg  30 mg Oral QHS PRN Eduard Clos, MD       Medications Prior to Admission  Medication Sig Dispense Refill  . aspirin 81 MG tablet Take 81 mg by mouth daily.        Marland Kitchen atorvastatin (LIPITOR) 40 MG tablet Take 40 mg by mouth daily.        . calcium carbonate (OS-CAL) 600 MG TABS Take 600 mg by mouth 2 (two) times daily.        . DULoxetine (CYMBALTA) 60 MG capsule Take 60 mg by mouth daily.        . famotidine (PEPCID) 20 MG tablet Take 20 mg by mouth at bedtime.      . fenofibrate 54 MG tablet Take 54 mg by mouth daily.        . furosemide (LASIX) 40 MG tablet Take 40 mg by mouth 2 (two) times daily.        Marland Kitchen glimepiride (AMARYL) 1 MG tablet Take 1 mg by mouth daily.        Marland Kitchen HYDROcodone-acetaminophen (NORCO) 7.5-325 MG per tablet Take 1 tablet by mouth at bedtime as needed. For pain      . isosorbide mononitrate (IMDUR) 30 MG 24 hr tablet Take 15 mg by mouth daily.       Marland Kitchen LOSARTAN POTASSIUM PO Take 100 mg by mouth daily.        . Multiple Vitamin (MULTIVITAMIN) capsule Take 1 capsule by mouth daily.      Marland Kitchen omeprazole (PRILOSEC OTC) 20 MG tablet Take 20 mg by mouth daily before breakfast.      . POTASSIUM CHLORIDE PO Take 20 mEq by mouth. Monday, Wednesday, Friday       . TOPROL XL 50 MG 24 hr tablet Take 1 tablet by mouth daily.       . vitamin B-12 (CYANOCOBALAMIN) 1000 MCG tablet  Take 1,000 mcg by mouth 2 (two) times  daily.        Marland Kitchen warfarin (COUMADIN) 2.5 MG tablet Take 2.5 mg by mouth daily.          Results for orders placed during the hospital encounter of 03/29/11 (from the past 48 hour(s))  CBC     Status: Abnormal   Collection Time   03/29/11  7:05 PM      Component Value Range Comment   WBC 8.4  4.0 - 10.5 (K/uL)    RBC 2.45 (*) 3.87 - 5.11 (MIL/uL)    Hemoglobin 7.8 (*) 12.0 - 15.0 (g/dL)    HCT 16.1 (*) 09.6 - 46.0 (%)    MCV 100.8 (*) 78.0 - 100.0 (fL)    MCH 31.8  26.0 - 34.0 (pg)    MCHC 31.6  30.0 - 36.0 (g/dL)    RDW 04.5  40.9 - 81.1 (%)    Platelets 331  150 - 400 (K/uL)   COMPREHENSIVE METABOLIC PANEL     Status: Abnormal   Collection Time   03/29/11  7:05 PM      Component Value Range Comment   Sodium 136  135 - 145 (mEq/L)    Potassium 4.5  3.5 - 5.1 (mEq/L)    Chloride 98  96 - 112 (mEq/L)    CO2 28  19 - 32 (mEq/L)    Glucose, Bld 208 (*) 70 - 99 (mg/dL)    BUN 30 (*) 6 - 23 (mg/dL)    Creatinine, Ser 9.14 (*) 0.50 - 1.10 (mg/dL)    Calcium 9.5  8.4 - 10.5 (mg/dL)    Total Protein 6.3  6.0 - 8.3 (g/dL)    Albumin 3.6  3.5 - 5.2 (g/dL)    AST 26  0 - 37 (U/L)    ALT 16  0 - 35 (U/L)    Alkaline Phosphatase 31 (*) 39 - 117 (U/L)    Total Bilirubin 0.4  0.3 - 1.2 (mg/dL)    GFR calc non Af Amer 35 (*) >90 (mL/min)    GFR calc Af Amer 40 (*) >90 (mL/min)   PROTIME-INR     Status: Abnormal   Collection Time   03/29/11  7:05 PM      Component Value Range Comment   Prothrombin Time 36.3 (*) 11.6 - 15.2 (seconds)    INR 3.58 (*) 0.00 - 1.49    APTT     Status: Abnormal   Collection Time   03/29/11  7:05 PM      Component Value Range Comment   aPTT 44 (*) 24 - 37 (seconds)   TYPE AND SCREEN     Status: Normal (Preliminary result)   Collection Time   03/29/11  7:06 PM      Component Value Range Comment   ABO/RH(D) A POS      Antibody Screen NEG      Sample Expiration 04/01/2011      Unit Number [id number]      Blood Component Type RED CELLS,LR      Unit division 00       Status of Unit ISSUED      Transfusion Status OK TO TRANSFUSE      Crossmatch Result Compatible      Unit Number [id number]      Blood Component Type RED CELLS,LR      Unit division 00      Status of Unit ISSUED      Transfusion Status  OK TO TRANSFUSE      Crossmatch Result Compatible     ABO/RH     Status: Normal   Collection Time   03/29/11  7:06 PM      Component Value Range Comment   ABO/RH(D) A POS     POCT I-STAT TROPONIN I     Status: Abnormal   Collection Time   03/29/11  7:25 PM      Component Value Range Comment   Troponin i, poc 0.33 (*) 0.00 - 0.08 (ng/mL)    Comment NOTIFIED PHYSICIAN      Comment 3            OCCULT BLOOD, POC DEVICE     Status: Normal   Collection Time   03/29/11  7:39 PM      Component Value Range Comment   Fecal Occult Bld POSITIVE     PREPARE RBC (CROSSMATCH)     Status: Normal   Collection Time   03/29/11  8:30 PM      Component Value Range Comment   Order Confirmation ORDER PROCESSED BY BLOOD BANK     GLUCOSE, CAPILLARY     Status: Abnormal   Collection Time   03/29/11 10:27 PM      Component Value Range Comment   Glucose-Capillary 115 (*) 70 - 99 (mg/dL)   MRSA PCR SCREENING     Status: Normal   Collection Time   03/29/11 10:46 PM      Component Value Range Comment   MRSA by PCR NEGATIVE  NEGATIVE     Dg Chest Portable 1 View  03/29/2011  *RADIOLOGY REPORT*  Clinical Data: Chest pain  PORTABLE CHEST - 1 VIEW  Comparison: 01/22/2011  Findings: Cardiomegaly again noted.  Status post CABG.  No acute infiltrate or pleural effusion.  No pulmonary edema.  Bony thorax is stable.  IMPRESSION: Cardiomegaly again noted.  Status post CABG.  No active disease.  Original Report Authenticated By: Natasha Mead, M.D.    Review of Systems  Constitutional: Negative.   HENT: Negative.   Eyes: Negative.   Respiratory: Positive for shortness of breath.   Cardiovascular: Positive for chest pain.  Gastrointestinal: Positive for melena.  Genitourinary:  Negative.   Musculoskeletal: Negative.   Skin: Negative.   Neurological: Negative.   Endo/Heme/Allergies: Negative.   Psychiatric/Behavioral: Negative.     Blood pressure 144/56, pulse 79, temperature 97.1 F (36.2 C), temperature source Axillary, resp. rate 18, height 5\' 2"  (1.575 m), weight 79.7 kg (175 lb 11.3 oz), SpO2 96.00%. Physical Exam  Constitutional: She is oriented to person, place, and time. She appears well-developed and well-nourished. No distress.  HENT:  Head: Normocephalic and atraumatic.  Right Ear: External ear normal.  Left Ear: External ear normal.  Nose: Nose normal.  Mouth/Throat: Oropharynx is clear and moist. No oropharyngeal exudate.  Eyes: Conjunctivae are normal. Pupils are equal, round, and reactive to light.  Neck: Normal range of motion. Neck supple.  Cardiovascular: Normal rate, regular rhythm and normal heart sounds.   Respiratory: Effort normal and breath sounds normal. No respiratory distress. She has no wheezes. She has no rales.  GI: Soft. Bowel sounds are normal.  Musculoskeletal: Normal range of motion. She exhibits edema and tenderness.  Neurological: She is alert and oriented to person, place, and time.       Moves all extremities.  Skin: She is not diaphoretic.       Small ulcer on the anterior aspect of the shin of the  right leg measuring around half a centimeter with mild discharge.  Psychiatric: Her behavior is normal.     Assessment/Plan #1. GI bleed most likely source is upper given the melena - patient will be kept n.p.o. except medications. Patient will be on Protonix drip and will hold off her Coumadin and aspirin for now. Consult GI in a.m. Patient is hemodynamically stable. Patient is being already transfused 2 units of PRBC after which we will recheck CBC. Patient did have a colonoscopy by Dr. Virginia Rochester 5 years ago. #2. Chest pain with elevated troponin with known history of CAD status post CABG and stenting - [facility] heart [facility]  vascular is aware of patient's admission. At this time we will cycle cardiac markers.Patient's EKG does show changes which are more pronounced than previous EKG. Due to the GI bleed patient will be kept off aspirin. #3. Coagulopathy secondary to Coumadin - Coumadin is on hold. Closely follow PT/INR. #4. History of mechanical aortic valve replacement - as advised the cardiologist patient's Coumadin will be on hold. #5. Mild cellulitis of the right anterior shin of the right leg - since patient is diabetic we will continue treatment with antibiotics. For now patient will be on doxycycline IV until patient can take by mouth. #6. Anemia probably from acute blood loss - closely follow CBC. #7. Mild renal insufficiency - may be from dehydration, closely follow metabolic panel. #8. Diabetes mellitus2 - we will continue her home dose of Lantus at a low dose. Resume full home dose Lantus once patient is able to take by mouth. Patient will be on sliding skin coverage.  CODE STATUS - DO NOT RESUSCITATE.    Modestine Scherzinger N. 03/30/2011, 12:55 AM

## 2011-03-30 NOTE — Progress Notes (Signed)
[facility]   Subjective: Alert. No BM since admit. No prior history of GIB. No chest pain or SOB.  Objective: Vital signs in last 24 hours: Temp:  [97.1 F (36.2 C)-98.6 F (37 C)] 98.6 F (37 C) (03/12 1215) Pulse Rate:  [71-92] 92  (03/12 0300) Resp:  [15-19] 18  (03/12 1215) BP: (110-162)/(34-88) 116/44 mmHg (03/12 1215) SpO2:  [95 %-100 %] 97 % (03/12 1215) Weight:  [79.7 kg (175 lb 11.3 oz)-80.3 kg (177 lb 0.5 oz)] 80.3 kg (177 lb 0.5 oz) (03/12 0500) Weight change:     Intake/Output from previous day: 03/11 0701 - 03/12 0700 In: 1620.3 [I.V.:215; Blood:1405.3] Out: 400 [Urine:400] Intake/Output this shift: Total I/O In: 1025 [I.V.:175; Blood:600; IV Piggyback:250] Out: -   General appearance: alert, cooperative, appears stated age and no distress Resp: clear to auscultation bilaterally except for bibasilar crackles, RA Cardio: regular rate and rhythm, S1, S2 normal, no murmur, click, rub or gallop GI: soft, non-tender; bowel sounds normal; no masses,  no organomegaly Extremities: extremities normal, atraumatic, no cyanosis or edema Neurologic: Grossly normal  Lab Results:  [facility] 03/30/11 0610 03/29/11 1905  WBC 12.8* 8.4  HGB 10.7* 7.8*  HCT 33.2* 24.7*  PLT 312 331   BMET  Basename 03/30/11 0610 03/29/11 1905  NA 137 136  K 4.3 4.5  CL 100 98  CO2 28 28  GLUCOSE 171* 208*  BUN 26* 30*  CREATININE 1.20* 1.48*  CALCIUM 9.3 9.5    Studies/Results: Dg Chest Portable 1 View  03/29/2011  *RADIOLOGY REPORT*  Clinical Data: Chest pain  PORTABLE CHEST - 1 VIEW  Comparison: 01/22/2011  Findings: Cardiomegaly again noted.  Status post CABG.  No acute infiltrate or pleural effusion.  No pulmonary edema.  Bony thorax is stable.  IMPRESSION: Cardiomegaly again noted.  Status post CABG.  No active disease.  Original Report Authenticated By: Natasha Mead, M.D.    Medications:  I have reviewed the patient's current medications. Scheduled:   .  atorvastatin  40 mg Oral q1800  . doxycycline (VIBRAMYCIN) IV  100 mg Intravenous BID  . DULoxetine  60 mg Oral Daily  . fenofibrate  54 mg Oral q1800  . furosemide  20 mg Intravenous Once  . furosemide  40 mg Intravenous Once  . insulin glargine  10 Units Subcutaneous QHS  . isosorbide mononitrate  30 mg Oral Daily  . metoprolol succinate  50 mg Oral Daily  . sodium chloride  3 mL Intravenous Q12H  . DISCONTD: atorvastatin  40 mg Oral Daily  . DISCONTD: fenofibrate  54 mg Oral Daily  . DISCONTD: fenofibrate  54 mg Oral Daily  . DISCONTD: isosorbide mononitrate  15 mg Oral Daily  . DISCONTD: nitroGLYCERIN  1 inch Topical Q6H    Assessment/Plan:  Principal Problem:  *GI bleed/ melena, Hgb 7.8 on admission * cont protonix infusion for presumed upper GI bleed.  * Appreciate GI assistance  Active Problems:  Anemia due to blood loss, acute * Hgb up to 10 after 2 units PRBC's * Follow CBC   Acute renal insufficiency, SCr 1.4 on admission * Baseline CrCl 50.6 cc- at admission down to 35.9 cc   NSTEMI, secondary to demand ischemia, Troponin 0.7/CAD, CABG 1991, redo 2006, last cath Dec 2011, medical Rx * Cardiology following * Increasing Imdur * ASA on hold due to GIB   H/O mechanical aortic valve replacement, St Jude 1991/Chronic anticoagulation with Coumadin * Coumadin on hold due to ABL anemia and GIB  of uncertain etiology * INR slow tend down * Will not reverse anti-coagulation since not frankly bleeding and hgb stable at this point * If develops frank bleeding or hgb trends back down likely will need Vitamin K and FFP   Cardiomyopathy, ischemic, EF 35-40% 2D Nov 2011/Left ventricular diastolic dysfunction, NYHA class 2 * Cards has ordered once dose IV Lasix today    Diabetes mellitus, type 2 *CBG controlled *Continue Lantus- begin SSI   Cellulitis of right lower extremity * Continue preadmit Doxycycline   Dyslipidemia * Continue statin   HTN (hypertension) * BP  controlled   Disposition * Remain in Stepdown    LOS: 1 day   Junious Silk, ANP pager [phone]  [facility] 1 [email or web address] Password: [email or web address]  03/30/2011, 1:50 PM  I have examined the patient, reviewed the chart and discussed the plan with Junious Silk, NP. I have modified the above note and agree with it.   Calvert Cantor, MD [phone]

## 2011-03-30 NOTE — Consult Note (Signed)
WOC consult Note Reason for Consult: Consult requested for right calf wound. Pt states it was previously deeper and had mod amt drainage, she was placed on antibiotics prior to admission and wound has greatly improved since that time. Wound type: Partial thickness healing wound. Measurement: .2X.2X.1cm Wound bed: pink and dry. Drainage (amount, consistency, odor) scant yellow  Dressing procedure/placement/frequency: Foam dressing to protect and promote healing.   Will not plan to follow further unless re-consulted.  [address], RN, MSN, [employer]  [phone]

## 2011-03-30 NOTE — Consult Note (Signed)
Patient seen, examined, and I agree with the above documentation, including the assessment and plan. Melena with anemia.  On warfarin for AVR, also with NSTEMI.  Clinicially well at present. No melena since yesterday pm Cont ppi gtt, cycle Hgb, transfuse as needed Plan EGD in am Cont to hold warfarin.  We discussed EGD and pt agreeable to proceed.  Some therapeutic options will be limited by elevated INR, which was explained to the patient today at the bedside.

## 2011-03-30 NOTE — Consult Note (Signed)
Reason for Consult: Positive Ttroponin  Requesting Physician: [facility]  HPI: This is a 71 y.o. female with a past medical history significant for AS and CAD. She had a St Jude AVR and CABG in 1991. She required redo CABG in 2006. She had a NSTEMI in Dec 2011 and cath then showed some progression on her native CAD which was treated medically. Her last EF was Nov 2011, 35-40% by 2D. LOV with Dr Clarene Duke was 01/05/11. She has done pretty well from a cardiac standpoint until this admission. She does say she has been having black stools for a week or so. On admission she had chest pain, Hgb 7.8. She was transfused 2 untis. She did become SOB during transfusion and received 20mg  lasix wand now denies SOB or chest pain.  PMHx:  Past Medical History  Diagnosis Date  . High blood pressure   . Angina at rest   . Irregular heart rhythm   . Diabetes mellitus   . High cholesterol   . Coronary artery disease    Past Surgical History  Procedure Date  . Gallbadder 2003  . Cardiac valve replacement 1991  . Heart bypass 1991/2005  . Angioplasty   . Coronary artery bypass graft   . Aortic valve replacement   . Appendectomy   . Abdominal hysterectomy     FAMHx: Family History  Problem Relation Age of Onset  . Rheum arthritis Mother   . Rheum arthritis Father   . Rheum arthritis Paternal Aunt   . Cervical cancer Mother   . Stomach cancer Mother     SOCHx:  reports that she quit smoking about 38 years ago. Her smoking use included Cigarettes. She has a 7.5 pack-year smoking history. She has never used smokeless tobacco. She reports that she does not drink alcohol or use illicit drugs.  ALLERGIES: Allergies  Allergen Reactions  . Codeine     Feels sick    ROS: Pertinent items are noted in HPI.  HOME MEDICATIONS: Prescriptions prior to admission  Medication Sig Dispense Refill  . aspirin 81 MG tablet Take 81 mg by mouth daily.        Marland Kitchen atorvastatin (LIPITOR) 40 MG tablet Take  40 mg by mouth daily.        . calcium carbonate (OS-CAL) 600 MG TABS Take 600 mg by mouth 2 (two) times daily.        . DULoxetine (CYMBALTA) 60 MG capsule Take 60 mg by mouth daily.        . famotidine (PEPCID) 20 MG tablet Take 20 mg by mouth at bedtime.      . fenofibrate 54 MG tablet Take 54 mg by mouth daily.        . furosemide (LASIX) 40 MG tablet Take 40 mg by mouth 2 (two) times daily.        Marland Kitchen glimepiride (AMARYL) 1 MG tablet Take 1 mg by mouth daily.        Marland Kitchen HYDROcodone-acetaminophen (NORCO) 7.5-325 MG per tablet Take 1 tablet by mouth at bedtime as needed. For pain      . isosorbide mononitrate (IMDUR) 30 MG 24 hr tablet Take 15 mg by mouth daily.       Marland Kitchen LOSARTAN POTASSIUM PO Take 100 mg by mouth daily.        . Multiple Vitamin (MULTIVITAMIN) capsule Take 1 capsule by mouth daily.      Marland Kitchen omeprazole (PRILOSEC OTC) 20 MG tablet Take 20 mg by mouth daily  before breakfast.      . POTASSIUM CHLORIDE PO Take 20 mEq by mouth. Monday, Wednesday, Friday       . temazepam (RESTORIL) 30 MG capsule Take 30 mg by mouth at bedtime as needed.      . TOPROL XL 50 MG 24 hr tablet Take 1 tablet by mouth daily.       . vitamin B-12 (CYANOCOBALAMIN) 1000 MCG tablet Take 1,000 mcg by mouth 2 (two) times daily.        Marland Kitchen warfarin (COUMADIN) 2.5 MG tablet Take 2.5 mg by mouth daily.          HOSPITAL MEDICATIONS: I have reviewed the patient's current medications.  VITALS: Blood pressure 138/78, pulse 92, temperature 97.5 F (36.4 C), temperature source Oral, resp. rate 18, height 5\' 2"  (1.575 m), weight 80.3 kg (177 lb 0.5 oz), SpO2 97.00%.  PHYSICAL EXAM: General appearance: alert, cooperative and no distress Neck: no JVD and supple, symmetrical, trachea midline Lungs: few basilar rales Heart: regular rate and rhythm and positive Abdomen: soft, non-tender; bowel sounds normal; no masses,  no organomegaly Extremities: no edema Neurologic: Grossly normal  LABS: Results for orders placed  during the hospital encounter of 03/29/11 (from the past 48 hour(s))  CBC     Status: Abnormal   Collection Time   03/29/11  7:05 PM      Component Value Range Comment   WBC 8.4  4.0 - 10.5 (K/uL)    RBC 2.45 (*) 3.87 - 5.11 (MIL/uL)    Hemoglobin 7.8 (*) 12.0 - 15.0 (g/dL)    HCT 95.6 (*) 21.3 - 46.0 (%)    MCV 100.8 (*) 78.0 - 100.0 (fL)    MCH 31.8  26.0 - 34.0 (pg)    MCHC 31.6  30.0 - 36.0 (g/dL)    RDW 08.6  57.8 - 46.9 (%)    Platelets 331  150 - 400 (K/uL)   COMPREHENSIVE METABOLIC PANEL     Status: Abnormal   Collection Time   03/29/11  7:05 PM      Component Value Range Comment   Sodium 136  135 - 145 (mEq/L)    Potassium 4.5  3.5 - 5.1 (mEq/L)    Chloride 98  96 - 112 (mEq/L)    CO2 28  19 - 32 (mEq/L)    Glucose, Bld 208 (*) 70 - 99 (mg/dL)    BUN 30 (*) 6 - 23 (mg/dL)    Creatinine, Ser 6.29 (*) 0.50 - 1.10 (mg/dL)    Calcium 9.5  8.4 - 10.5 (mg/dL)    Total Protein 6.3  6.0 - 8.3 (g/dL)    Albumin 3.6  3.5 - 5.2 (g/dL)    AST 26  0 - 37 (U/L)    ALT 16  0 - 35 (U/L)    Alkaline Phosphatase 31 (*) 39 - 117 (U/L)    Total Bilirubin 0.4  0.3 - 1.2 (mg/dL)    GFR calc non Af Amer 35 (*) >90 (mL/min)    GFR calc Af Amer 40 (*) >90 (mL/min)   PROTIME-INR     Status: Abnormal   Collection Time   03/29/11  7:05 PM      Component Value Range Comment   Prothrombin Time 36.3 (*) 11.6 - 15.2 (seconds)    INR 3.58 (*) 0.00 - 1.49    APTT     Status: Abnormal   Collection Time   03/29/11  7:05 PM      Component  Value Range Comment   aPTT 44 (*) 24 - 37 (seconds)   TYPE AND SCREEN     Status: Normal   Collection Time   03/29/11  7:06 PM      Component Value Range Comment   ABO/RH(D) A POS      Antibody Screen NEG      Sample Expiration 04/01/2011      Unit Number [id number]      Blood Component Type RED CELLS,LR      Unit division 00      Status of Unit ISSUED,FINAL      Transfusion Status OK TO TRANSFUSE      Crossmatch Result Compatible      Unit Number [id number]       Blood Component Type RED CELLS,LR      Unit division 00      Status of Unit ISSUED,FINAL      Transfusion Status OK TO TRANSFUSE      Crossmatch Result Compatible     ABO/RH     Status: Normal   Collection Time   03/29/11  7:06 PM      Component Value Range Comment   ABO/RH(D) A POS     POCT I-STAT TROPONIN I     Status: Abnormal   Collection Time   03/29/11  7:25 PM      Component Value Range Comment   Troponin i, poc 0.33 (*) 0.00 - 0.08 (ng/mL)    Comment NOTIFIED PHYSICIAN      Comment 3            OCCULT BLOOD, POC DEVICE     Status: Normal   Collection Time   03/29/11  7:39 PM      Component Value Range Comment   Fecal Occult Bld POSITIVE     PREPARE RBC (CROSSMATCH)     Status: Normal   Collection Time   03/29/11  8:30 PM      Component Value Range Comment   Order Confirmation ORDER PROCESSED BY BLOOD BANK     GLUCOSE, CAPILLARY     Status: Abnormal   Collection Time   03/29/11 10:27 PM      Component Value Range Comment   Glucose-Capillary 115 (*) 70 - 99 (mg/dL)   MRSA PCR SCREENING     Status: Normal   Collection Time   03/29/11 10:46 PM      Component Value Range Comment   MRSA by PCR NEGATIVE  NEGATIVE    CBC     Status: Abnormal   Collection Time   03/30/11  6:10 AM      Component Value Range Comment   WBC 12.8 (*) 4.0 - 10.5 (K/uL)    RBC 3.58 (*) 3.87 - 5.11 (MIL/uL)    Hemoglobin 10.7 (*) 12.0 - 15.0 (g/dL)    HCT 84.1 (*) 32.4 - 46.0 (%)    MCV 92.7  78.0 - 100.0 (fL)    MCH 29.9  26.0 - 34.0 (pg)    MCHC 32.2  30.0 - 36.0 (g/dL)    RDW 40.1 (*) 02.7 - 15.5 (%)    Platelets 312  150 - 400 (K/uL)   COMPREHENSIVE METABOLIC PANEL     Status: Abnormal   Collection Time   03/30/11  6:10 AM      Component Value Range Comment   Sodium 137  135 - 145 (mEq/L)    Potassium 4.3  3.5 - 5.1 (mEq/L)    Chloride 100  96 -  112 (mEq/L)    CO2 28  19 - 32 (mEq/L)    Glucose, Bld 171 (*) 70 - 99 (mg/dL)    BUN 26 (*) 6 - 23 (mg/dL)    Creatinine, Ser 4.54 (*)  0.50 - 1.10 (mg/dL)    Calcium 9.3  8.4 - 10.5 (mg/dL)    Total Protein 6.0  6.0 - 8.3 (g/dL)    Albumin 3.5  3.5 - 5.2 (g/dL)    AST 27  0 - 37 (U/L)    ALT 16  0 - 35 (U/L)    Alkaline Phosphatase 34 (*) 39 - 117 (U/L)    Total Bilirubin 0.8  0.3 - 1.2 (mg/dL)    GFR calc non Af Amer 45 (*) >90 (mL/min)    GFR calc Af Amer 52 (*) >90 (mL/min)   PROTIME-INR     Status: Abnormal   Collection Time   03/30/11  6:10 AM      Component Value Range Comment   Prothrombin Time 29.5 (*) 11.6 - 15.2 (seconds)    INR 2.75 (*) 0.00 - 1.49    CARDIAC PANEL(CRET KIN+CKTOT+MB+TROPI)     Status: Abnormal   Collection Time   03/30/11  6:10 AM      Component Value Range Comment   Total CK 128  7 - 177 (U/L)    CK, MB 2.9  0.3 - 4.0 (ng/mL)    Troponin I 0.71 (*) <0.30 (ng/mL)    Relative Index 2.3  0.0 - 2.5    GLUCOSE, CAPILLARY     Status: Abnormal   Collection Time   03/30/11  7:19 AM      Component Value Range Comment   Glucose-Capillary 157 (*) 70 - 99 (mg/dL)     IMAGING: Dg Chest Portable 1 View  03/29/2011  *RADIOLOGY REPORT*  Clinical Data: Chest pain  PORTABLE CHEST - 1 VIEW  Comparison: 01/22/2011  Findings: Cardiomegaly again noted.  Status post CABG.  No acute infiltrate or pleural effusion.  No pulmonary edema.  Bony thorax is stable.  IMPRESSION: Cardiomegaly again noted.  Status post CABG.  No active disease.  Original Report Authenticated By: Natasha Mead, M.D.    IMPRESSION:  Principal Problem:  *GI bleed, Hgb 7.8 on admission  Active Problems:  NSTEMI, secondary to demand ischemia, Troponin 0.7  CAD, CABG 1991, redo 2006, last cath Dec 2011, medical Rx  Anemia due to blood loss, acute  Diabetes mellitus, type 2  H/O mechanical aortic valve replacement, St Jude 1991  Chronic anticoagulation with Coumadin, INR 3.58 on admission  Cardiomyopathy, ischemic, EF 35-40% 2D Nov 2011  Cellulitis of right lower extremity, on ABs as OP  Acute renal insufficiency, SCr 1.4 on  admission  Dyslipidemia  HTN (hypertension)   RECOMMENDATION: Will give Lasix 40mg  IV now X 1. Increase Imdur to 30mg  daily. MD to see, Coumadin and asa on hold. GI consult pending.  Time Spent Directly with Patient: 30 minutes  KILROY,LUKE K 03/30/2011, 10:14 AM   I have seen and examined the patient along with Abelino Derrick, PA .  I have reviewed the chart, notes and new data.  I agree with PA's note.  Key new complaints: Feels well no further dyspnea; no melena (or any BM) since yesterday. Key examination changes: bilateral pulmonary rales that do not fully clear with cough, but no overt JVD or edema; crisp prosthetic valve sounds2 Key new findings / data: INR 2.75 (drifting down naturally from 3.58), Hgb 10.7 after transfusion  PLAN: EGD - she had frank melena Hold warfarin until cause of GI bleeding identified. Agree w additional diuretics Angina not unexpected in view of anemia and known coronary anatomy.  Thurmon Fair, MD, [facility] [phone] 03/30/2011, 10:48 AM

## 2011-03-31 ENCOUNTER — Encounter (HOSPITAL_COMMUNITY): Payer: Self-pay | Admitting: *Deleted

## 2011-03-31 ENCOUNTER — Encounter (HOSPITAL_COMMUNITY)
Admission: AD | Disposition: A | Discharge status: Home / Self Care | Payer: Self-pay | Source: Home / Self Care | Attending: Internal Medicine

## 2011-03-31 ENCOUNTER — Inpatient Hospital Stay (HOSPITAL_COMMUNITY): Payer: Medicare Other

## 2011-03-31 ENCOUNTER — Ambulatory Visit: Payer: Medicare Other

## 2011-03-31 HISTORY — PX: ESOPHAGOGASTRODUODENOSCOPY: SHX5428

## 2011-03-31 LAB — BASIC METABOLIC PANEL
BUN: 17 mg/dL (ref 6–23)
CO2: 29 mEq/L (ref 19–32)
Calcium: 8.7 mg/dL (ref 8.4–10.5)
Chloride: 99 mEq/L (ref 96–112)
Creatinine, Ser: 0.96 mg/dL (ref 0.50–1.10)
GFR calc Af Amer: 68 mL/min — ABNORMAL LOW (ref >90)
GFR calc non Af Amer: 59 mL/min — ABNORMAL LOW (ref >90)
Glucose, Bld: 172 mg/dL — ABNORMAL HIGH (ref 70–99)
Potassium: 3.9 mEq/L (ref 3.5–5.1)
Sodium: 138 mEq/L (ref 135–145)

## 2011-03-31 LAB — GLUCOSE, CAPILLARY
Glucose-Capillary: 177 mg/dL — ABNORMAL HIGH (ref 70–99)
Glucose-Capillary: 161 mg/dL — ABNORMAL HIGH (ref 70–99)
Glucose-Capillary: 180 mg/dL — ABNORMAL HIGH (ref 70–99)
Glucose-Capillary: 223 mg/dL — ABNORMAL HIGH (ref 70–99)
Glucose-Capillary: 211 mg/dL — ABNORMAL HIGH (ref 70–99)

## 2011-03-31 LAB — CBC
HCT: 31 % — ABNORMAL LOW (ref 36.0–46.0)
Hemoglobin: 9.9 g/dL — ABNORMAL LOW (ref 12.0–15.0)
MCH: 30.4 pg (ref 26.0–34.0)
MCHC: 31.9 g/dL (ref 30.0–36.0)
MCV: 95.1 fL (ref 78.0–100.0)
Platelets: 298 10*3/uL (ref 150–400)
RBC: 3.26 MIL/uL — ABNORMAL LOW (ref 3.87–5.11)
RDW: 17.2 % — ABNORMAL HIGH (ref 11.5–15.5)
WBC: 6.5 10*3/uL (ref 4.0–10.5)

## 2011-03-31 LAB — PROTIME-INR: INR: 2.29 — ABNORMAL HIGH (ref 0.00–1.49)

## 2011-03-31 SURGERY — EGD (ESOPHAGOGASTRODUODENOSCOPY)
Anesthesia: Moderate Sedation

## 2011-03-31 MED ORDER — WARFARIN SODIUM 5 MG PO TABS
5.0000 mg | ORAL_TABLET | Freq: Once | ORAL | Status: AC
  Administered 2011-03-31: 5 mg via ORAL
  Filled 2011-03-31: qty 1

## 2011-03-31 MED ORDER — WARFARIN - PHARMACIST DOSING INPATIENT: Freq: Every day | Status: DC

## 2011-03-31 MED ORDER — INSULIN ASPART 100 UNIT/ML ~~LOC~~ SOLN
0.0000 [IU] | Freq: Every day | SUBCUTANEOUS | Status: DC
  Administered 2011-03-31 – 2011-04-01 (×2): 2 [IU] via SUBCUTANEOUS

## 2011-03-31 MED ORDER — MIDAZOLAM HCL 10 MG/2ML IJ SOLN
INTRAMUSCULAR | Status: DC | PRN
  Administered 2011-03-31: 2 mg via INTRAVENOUS
  Administered 2011-03-31: 1 mg via INTRAVENOUS
  Administered 2011-03-31 (×2): 2 mg via INTRAVENOUS

## 2011-03-31 MED ORDER — SODIUM CHLORIDE 0.9 % IV SOLN: Freq: Once | INTRAVENOUS | Status: DC

## 2011-03-31 MED ORDER — FUROSEMIDE 40 MG PO TABS
40.0000 mg | ORAL_TABLET | Freq: Every day | ORAL | Status: DC
  Administered 2011-03-31: 40 mg via ORAL
  Filled 2011-03-31 (×2): qty 1

## 2011-03-31 MED ORDER — MIDAZOLAM HCL 10 MG/2ML IJ SOLN
INTRAMUSCULAR | Status: AC
  Filled 2011-03-31: qty 2

## 2011-03-31 MED ORDER — FENTANYL CITRATE 0.05 MG/ML IJ SOLN
INTRAMUSCULAR | Status: DC | PRN
  Administered 2011-03-31 (×3): 25 ug via INTRAVENOUS

## 2011-03-31 MED ORDER — PANTOPRAZOLE SODIUM 40 MG PO TBEC
40.0000 mg | DELAYED_RELEASE_TABLET | Freq: Two times a day (BID) | ORAL | Status: DC
  Administered 2011-03-31 – 2011-04-02 (×4): 40 mg via ORAL
  Filled 2011-03-31 (×5): qty 1

## 2011-03-31 MED ORDER — FENTANYL CITRATE 0.05 MG/ML IJ SOLN
INTRAMUSCULAR | Status: AC
  Filled 2011-03-31: qty 2

## 2011-03-31 MED ORDER — INSULIN ASPART 100 UNIT/ML ~~LOC~~ SOLN
0.0000 [IU] | Freq: Three times a day (TID) | SUBCUTANEOUS | Status: DC
  Administered 2011-03-31 – 2011-04-01 (×2): 5 [IU] via SUBCUTANEOUS
  Administered 2011-04-01: 3 [IU] via SUBCUTANEOUS
  Administered 2011-04-01: 5 [IU] via SUBCUTANEOUS
  Administered 2011-04-02: 3 [IU] via SUBCUTANEOUS
  Administered 2011-04-02: 8 [IU] via SUBCUTANEOUS

## 2011-03-31 NOTE — Progress Notes (Signed)
Subjective:  No SOB today  Objective:  Vital Signs in the last 24 hours: Temp:  [98.4 F (36.9 C)-98.6 F (37 C)] 98.5 F (36.9 C) (03/13 0400) Pulse Rate:  [70] 70  (03/12 2008) Resp:  [18-22] 22  (03/13 0400) BP: (109-138)/(35-78) 138/78 mmHg (03/13 0400) SpO2:  [96 %-98 %] 96 % (03/13 0400)  Intake/Output from previous day:  Intake/Output Summary (Last 24 hours) at 03/31/11 0806 Last data filed at 03/31/11 0600  Gross per 24 hour  Intake   4120 ml  Output   1000 ml  Net   3120 ml    Physical Exam: General appearance: alert, cooperative and no distress Lungs: few basilar crackles bilat Heart: regular rate and rhythm and positive valve sounds   Rate: 72  Rhythm: normal sinus rhythm  Lab Results:  Basename 03/31/11 0515 03/30/11 0610  WBC 6.5 12.8*  HGB 9.9* 10.7*  PLT 298 312    Basename 03/31/11 0515 03/30/11 0610  NA 138 137  K 3.9 4.3  CL 99 100  CO2 29 28  GLUCOSE 172* 171*  BUN 17 26*  CREATININE 0.96 1.20*    Basename 03/30/11 2046 03/30/11 1306  TROPONINI 0.43* 0.54*   Hepatic Function Panel  Basename 03/30/11 0610  PROT 6.0  ALBUMIN 3.5  AST 27  ALT 16  ALKPHOS 34*  BILITOT 0.8  BILIDIR --  IBILI --   No results found for this basename: CHOL in the last 72 hours  Basename 03/30/11 0610  INR 2.75*    Imaging: Dg Chest Port 1 View    IMPRESSION: No convincing pulmonary edema.  Cardiomegaly.  Status post CABG. Probable small left basilar pleural effusion with left basilar atelectasis or infiltrate.  Original Report Authenticated By: Natasha Mead, M.D.    Cardiac Studies:  Assessment/Plan:   Principal Problem:  *GI bleed, Hgb 7.8 on admission, transfused 2 units  Active Problems:  NSTEMI, secondary to demand ischemia, Troponin 0.7pk  CAD, CABG 1991, redo 2006, last cath Dec 2011, medical Rx  Anemia due to blood loss, acute  Diabetes mellitus, type 2  H/O mechanical aortic valve replacement, St Jude 1991  Chronic  anticoagulation with Coumadin  Cardiomyopathy, ischemic, EF 35-40% 2D Nov 2011  Cellulitis of right lower extremity, on ABs  Acute renal insufficiency, SCr 1.4 on admission, now WNL  Dyslipidemia  HTN (hypertension)    Plan- She had SOB yesterday after transfusion, better after Lasix 40mg  IV X 1. Despite Lasix yesterday her I/O is +3L. We will need to watch for CHF but she currently seems stable. She is for endoscopy today. Nitrate was increased yesterday, (no chest pain). She was on Lasix 40mg  BID at home, will resume 40mg  daily and follow.   [employer] PA-C 03/31/2011, 8:06 AM

## 2011-03-31 NOTE — Progress Notes (Signed)
[facility]  Subjective: No reports of chest pain or SOB today. Eager to [employer] home. Denies f/c, n/v, or abdom pain.   Objective: Blood pressure 103/42, pulse 71, temperature 97.9 F (36.6 C), temperature source Oral, resp. rate 20, height 5\' 2"  (1.575 m), weight 80.3 kg (177 lb 0.5 oz), SpO2 94.00%. Weight change:  Last BM Date: 03/31/11  Intake/Output from previous day: 03/12 0701 - 03/13 0700 In: 4190 [P.O.:1440; I.V.:850; Blood:1650; IV Piggyback:250] Out: 1000 [Urine:1000] Intake/Output this shift: Total I/O In: 125 [I.V.:125] Out: -   General appearance: alert, cooperative, appears stated age and no distress Resp: clear to auscultation bilaterally, RA Cardio: regular rate and rhythm, S1, S2 normal, no murmur, click, rub or gallop GI: soft, non-tender; bowel sounds normal; no masses,  no organomegaly Extremities: extremities normal, atraumatic, no cyanosis or edema Neurologic: Grossly normal  Lab Results:  Basename 03/31/11 0515 03/30/11 0610  WBC 6.5 12.8*  HGB 9.9* 10.7*  HCT 31.0* 33.2*  PLT 298 312   BMET  Basename 03/31/11 0515 03/30/11 0610  NA 138 137  K 3.9 4.3  CL 99 100  CO2 29 28  GLUCOSE 172* 171*  BUN 17 26*  CREATININE 0.96 1.20*  CALCIUM 8.7 9.3   Medications:  I have reviewed the patient's current medications.  Assessment/Plan:  GI bleed/ melena, due to 2 antral ulcers/mild gastritis * Ulcers were clean based and non bleeding * Will transition fromm protonix infusion to PO BID dosing. GI recommends continue BID for 12 weeks then daily. * H pylori is pending  * No NSAID's * Appreciate GI assistance * path pending  Anemia due to blood loss, acute * Hgb up to 10 after 2 units PRBC's yesterday and subtle drift down to 9.9 today * Follow CBC  Acute renal insufficiency, SCr 1.4 on admission * resolved * Baseline CrCl 50.6 cc- at admission down to 35.9 cc- creatinine down to 0.96  NSTEMI, secondary to demand ischemia, Troponin  0.7/CAD, CABG 1991, redo 2006, last cath Dec 2011, medical Rx * Cardiology following * Imdur increased 3/12 * ASA was on hold due to GIB- GI says can resume  H/O mechanical aortic valve replacement, St Jude 1991/Chronic anticoagulation with Coumadin * Coumadin was on hold due to ABL anemia and GIB - GI states can resume anticoagulation * Will ask pharmacy to dose coumadin  Cardiomyopathy, ischemic, EF 35-40% 2D Nov 2011/Left ventricular diastolic dysfunction, NYHA class 2 * Cards has ordered once dose IV Lasix 3/12 after PRBC's were given   Diabetes mellitus, type 2 *CBG controlled *Continue Lantus and SSI  Cellulitis of right lower extremity * Continue preadmit Doxycycline  Dyslipidemia * Continue statin  HTN (hypertension) * BP controlled  Disposition * Remain in Stepdown for an additional 24 hours   LOS: 2 days   Junious Silk, ANP pager [phone]  [facility] [email or web address] Password: [email or web address]  03/31/2011, 1:22 PM  I have personally examined this patient and reviewed the entire database. I have reviewed the above note, made any necessary editorial changes, and agree with its content.  Lonia Blood, MD [facility]

## 2011-03-31 NOTE — Progress Notes (Signed)
Inpatient Diabetes Program Recommendations  AACE/ADA: New Consensus Statement on Inpatient Glycemic Control  Target Ranges:  Prepandial:   less than 140 mg/dL      Peak postprandial:   less than 180 mg/dL (1-2 hours)      Critically ill patients:  140 - 180 mg/dL  Pager:  [phone] Hours:  8 am-10pm   Reason for Visit: Elevated glucose:  Results for [name], [name] (MRN [id number]) as of 03/31/2011 09:40  Ref. Range 03/30/2011 07:19 03/30/2011 12:22 03/30/2011 20:11 03/30/2011 23:35 03/31/2011 03:59 03/31/2011 07:15  Glucose-Capillary Latest Range: 70-99 mg/dL 147 (H) 829 (H) 562 (H) 230 (H) 161 (H) 180 (H)    Inpatient Diabetes Program Recommendations Correction (SSI): Add Novolog Correction Insulin - Meal Coverage: Consider meal coverage once patient resumes eating

## 2011-03-31 NOTE — Consult Note (Signed)
ANTICOAGULATION CONSULT NOTE - Initial Consult  Pharmacy Consult for Coumadin Indication: mechanical aortic valve  Allergies  Allergen Reactions  . Codeine     Feels sick   Patient Measurements: Height: 5\' 2"  (157.5 cm) Weight: 177 lb 0.5 oz (80.3 kg) IBW/kg (Calculated) : 50.1   Vital Signs: Temp: 98.8 F (37.1 C) (03/13 1215) Temp src: Oral (03/13 1215) BP: 120/46 mmHg (03/13 1215) Pulse Rate: 70  (03/13 1215)  Labs:  Basename 03/31/11 0900 03/31/11 0515 03/30/11 2046 03/30/11 1306 03/30/11 0610 03/29/11 1905  HGB -- 9.9* -- -- 10.7* --  HCT -- 31.0* -- -- 33.2* 24.7*  PLT -- 298 -- -- 312 331  APTT -- -- -- -- -- 44*  LABPROT 25.6* -- -- -- 29.5* 36.3*  INR 2.29* -- -- -- 2.75* 3.58*  HEPARINUNFRC -- -- -- -- -- --  CREATININE -- 0.96 -- -- 1.20* 1.48*  CKTOTAL -- -- 116 138 128 --  CKMB -- -- 2.3 2.9 2.9 --  TROPONINI -- -- 0.43* 0.54* 0.71* --   Estimated Creatinine Clearance: 53.5 ml/min (by C-G formula based on Cr of 0.96).  Medical History: Past Medical History  Diagnosis Date  . High blood pressure   . Angina at rest   . Irregular heart rhythm   . Diabetes mellitus   . High cholesterol   . Coronary artery disease   . COPD (chronic obstructive pulmonary disease)    Medications:  Prescriptions prior to admission  Medication Sig Dispense Refill  . aspirin 81 MG tablet Take 81 mg by mouth daily.        Marland Kitchen atorvastatin (LIPITOR) 40 MG tablet Take 40 mg by mouth daily.        . calcium carbonate (OS-CAL) 600 MG TABS Take 600 mg by mouth 2 (two) times daily.        . DULoxetine (CYMBALTA) 60 MG capsule Take 60 mg by mouth daily.        . fenofibrate 54 MG tablet Take 54 mg by mouth daily.        . furosemide (LASIX) 40 MG tablet Take 40 mg by mouth 2 (two) times daily.        Marland Kitchen glimepiride (AMARYL) 1 MG tablet Take 1 mg by mouth daily.        Marland Kitchen HYDROcodone-acetaminophen (NORCO) 7.5-325 MG per tablet Take 1 tablet by mouth at bedtime as needed. For pain       . isosorbide mononitrate (IMDUR) 30 MG 24 hr tablet Take 15 mg by mouth daily.       Marland Kitchen LOSARTAN POTASSIUM PO Take 100 mg by mouth daily.        . Multiple Vitamin (MULTIVITAMIN) capsule Take 1 capsule by mouth daily.      Marland Kitchen omeprazole (PRILOSEC OTC) 20 MG tablet Take 20 mg by mouth daily before breakfast.      . POTASSIUM CHLORIDE PO Take 20 mEq by mouth. Monday, Wednesday, Friday       . temazepam (RESTORIL) 30 MG capsule Take 30 mg by mouth at bedtime as needed.      . TOPROL XL 50 MG 24 hr tablet Take 1 tablet by mouth daily.       . vitamin B-12 (CYANOCOBALAMIN) 1000 MCG tablet Take 1,000 mcg by mouth 2 (two) times daily.        Marland Kitchen warfarin (COUMADIN) 2.5 MG tablet Take 2.5 mg by mouth daily.        . famotidine (PEPCID) 20  MG tablet Take 20 mg by mouth at bedtime.       Assessment: [name] on coumadin pta, admitted 3/11 with ?GI bleed. GI consulted and EGD today did not show any active bleeding, just mild gastritis. OK to resume anticoag per GI.  INR is slightly below goal. No coumadin given 3/12. Home dose per patient is  is 2.5mg  daily except 5mg  MWF.  Goal of Therapy:  INR 2.5-3.5   Plan:  1) Coumadin 5mg  x 1 2) Daily INR  Fredrik Rigger 03/31/2011,1:24 PM

## 2011-03-31 NOTE — Progress Notes (Signed)
Pt. Seen and examined. Agree with the NP/PA-C note as written.  Dyspneic yesterday with transfusion, responded to lasix. Sitting up in the chair reading the paper today. Stool this morning was "less tarry". On protonix drip. Plan for EGD today.  Chrystie Nose, MD, [facility] Attending Cardiologist [facility]

## 2011-03-31 NOTE — Progress Notes (Signed)
Pt. Returned from endoscopy. HR 72, BP 127/46, RR 18.  Will continue to monitor

## 2011-03-31 NOTE — Op Note (Signed)
[facility] [address] [place], [place]  [address]  ENDOSCOPY PROCEDURE REPORT  PATIENT:  [name], [name]  MR#:  [id number] BIRTHDATE:  February 18, 1940, 70 yrs. old  GENDER:  female ENDOSCOPIST:  Carie Caddy. Cashmere Dingley, MD Referred by:  [facility], PROCEDURE DATE:  03/31/2011 PROCEDURE:  EGD with biopsy, 43239 ASA CLASS:  Class III INDICATIONS:  melena, anemia MEDICATIONS:   Fentanyl 75 mcg IV, Versed 7 mg IV TOPICAL ANESTHETIC:  Cetacaine Spray  DESCRIPTION OF PROCEDURE:   After the risks benefits and alternatives of the procedure were thoroughly explained, informed consent was obtained.  The Pentax Gastroscope [id number] endoscope was introduced through the mouth and advanced to the second portion of the duodenum, without limitations.  The instrument was slowly withdrawn as the mucosa was fully examined. <<PROCEDUREIMAGES>>  A partial and non-obstructing Schatzki's ring was found at the gastroesophageal junction.  Otherwise normal esophagus.  Multiple ulcers (2) with clean-base were found antrum. There was no stigmata or evidence of bleeding. Multiple biopsies were obtained and sent to pathology.  Mild gastritis was found antrum  The duodenal bulb was normal in appearance, as was the postbulbar duodenum.    Retroflexed views revealed no abnormalities.    The scope was then withdrawn from the patient and the procedure completed.  COMPLICATIONS:  None  ENDOSCOPIC IMPRESSION: 1) Partial and non-obstructing Schatzki's ring at the gastroesophageal junction 2) Otherwise normal esophagus 3) Two cleaned-based ulcers in the antrum.  Multiple biopsies performed. 4) Mild gastritis in the antrum. 5) Normal duodenum  RECOMMENDATIONS: 1) Await pathology results 2) Follow-up of helicobacter pylori status, treat if indicated 3) Avoid NSAIDs 4) Twice daily oral PPI for 12 weeks, then daily thereafter. 5) Okay to resume anticoagulation now. 6) Follow Hgb to ensure  normalization.  Carie Caddy. Rhea Belton, MD  CC:  Romero Liner, MD The Patient  n. eSIGNEDCarie Caddy. Johnny Latu at 03/31/2011 10:53 AM  Ardath Sax, [phone]

## 2011-03-31 NOTE — Progress Notes (Signed)
Patient seen, examined, and I agree with the above documentation, including the assessment and plan. Proceeding with EGD, we have discussed that INR is 2.23, which may prevent certain endo-therapy, such as coagulation. However, other options exist should endo-therapy be required, i.e. Clipping The nature of the procedure, as well as the risks, benefits, and alternatives were carefully and thoroughly reviewed with the patient. Ample time for discussion and questions allowed. The patient understood, was satisfied, and agreed to proceed.

## 2011-03-31 NOTE — Progress Notes (Signed)
Patient ID: [name], female   DOB: 11-Aug-1940, 71 y.o.   MRN: [id number] [facility] Progress Note  Subjective: Feels well, no c/o chest pain or SOB, one BM this am lighter in color  Objective:  Vital signs in last 24 hours: Temp:  [97.8 F (36.6 C)-98.6 F (37 C)] 97.8 F (36.6 C) (03/13 0720) Pulse Rate:  [70] 70  (03/12 2008) Resp:  [18-24] 24  (03/13 0720) BP: (109-138)/(35-78) 129/48 mmHg (03/13 0720) SpO2:  [96 %-98 %] 96 % (03/13 0720) Last BM Date: 03/29/11 General:   Alert,  Well-developed,    in NAD Heart:  Regular rate and rhythm; no murmurs,valve click Pulm;clear Abdomen:  Soft, nontender and nondistended. Normal bowel sounds, without guarding,Extremities:  Without edema. Neurologic:  Alert and  oriented x4;  grossly normal neurologically. Psych:  Alert and cooperative. Normal mood and affect.  Intake/Output from previous day: 03/12 0701 - 03/13 0700 In: 4155 [P.O.:1440; I.V.:815; Blood:1650; IV Piggyback:250] Out: 1000 [Urine:1000] Intake/Output this shift:    Lab Results:  Basename 03/31/11 0515 03/30/11 0610 03/29/11 1905  WBC 6.5 12.8* 8.4  HGB 9.9* 10.7* 7.8*  HCT 31.0* 33.2* 24.7*  PLT 298 312 331   BMET  Basename 03/31/11 0515 03/30/11 0610 03/29/11 1905  NA 138 137 136  K 3.9 4.3 4.5  CL 99 100 98  CO2 29 28 28   GLUCOSE 172* 171* 208*  BUN 17 26* 30*  CREATININE 0.96 1.20* 1.48*  CALCIUM 8.7 9.3 9.5   LFT  Basename 03/30/11 0610  PROT 6.0  ALBUMIN 3.5  AST 27  ALT 16  ALKPHOS 34*  BILITOT 0.8  BILIDIR --  IBILI --   PT/INR  Basename 03/30/11 0610 03/29/11 1905  LABPROT 29.5* 36.3*  INR 2.75* 3.58*      Assessment / Plan: #1 71 yo female with acute Gi bleed in setting of chronic anticoagulation- no further active bleeding, hgb stable post transfusions For EGD this am On PPI infusion Check PT stat Further plans pending EGD #2 NSTEMI- secondary to demand Ischemia with anemia #3 s/p AVR #4 S/p CABG, and  redo CABG Principal Problem:  *GI bleed, Hgb 7.8 on admission Active Problems:  CAD, CABG 1991, redo 2006, last cath Dec 2011, medical Rx  Anemia due to blood loss, acute  Diabetes mellitus, type 2  Cellulitis of right lower extremity  Acute renal insufficiency, SCr 1.4 on admission  H/O mechanical aortic valve replacement, St Jude 1991  Chronic anticoagulation with Coumadin  Dyslipidemia  HTN (hypertension)  Cardiomyopathy, ischemic, EF 35-40% 2D Nov 2011  NSTEMI, secondary to demand ischemia, Troponin 0.7     LOS: 2 days   [name]  03/31/2011, 8:58 AM

## 2011-04-01 ENCOUNTER — Encounter (HOSPITAL_COMMUNITY): Payer: Self-pay | Admitting: Internal Medicine

## 2011-04-01 DIAGNOSIS — K259 Gastric ulcer, unspecified as acute or chronic, without hemorrhage or perforation: Secondary | ICD-10-CM

## 2011-04-01 LAB — GLUCOSE, CAPILLARY
Glucose-Capillary: 227 mg/dL — ABNORMAL HIGH (ref 70–99)
Glucose-Capillary: 219 mg/dL — ABNORMAL HIGH (ref 70–99)

## 2011-04-01 LAB — CBC
Platelets: 310 10*3/uL (ref 150–400)
RBC: 3.28 MIL/uL — ABNORMAL LOW (ref 3.87–5.11)
RDW: 16.4 % — ABNORMAL HIGH (ref 11.5–15.5)
WBC: 6.8 10*3/uL (ref 4.0–10.5)

## 2011-04-01 LAB — BASIC METABOLIC PANEL
Calcium: 8.5 mg/dL (ref 8.4–10.5)
Creatinine, Ser: 0.93 mg/dL (ref 0.50–1.10)
GFR calc Af Amer: 71 mL/min — ABNORMAL LOW (ref >90)
Sodium: 138 mEq/L (ref 135–145)

## 2011-04-01 LAB — PROTIME-INR
INR: 2.04 — ABNORMAL HIGH (ref 0.00–1.49)
Prothrombin Time: 23.4 seconds — ABNORMAL HIGH (ref 11.6–15.2)

## 2011-04-01 MED ORDER — METOPROLOL SUCCINATE ER 25 MG PO TB24
25.0000 mg | ORAL_TABLET | Freq: Once | ORAL | Status: AC
  Administered 2011-04-01: 25 mg via ORAL
  Filled 2011-04-01 (×2): qty 1

## 2011-04-01 MED ORDER — METOPROLOL SUCCINATE ER 50 MG PO TB24
75.0000 mg | ORAL_TABLET | Freq: Every day | ORAL | Status: DC
  Administered 2011-04-02: 75 mg via ORAL
  Filled 2011-04-01: qty 1

## 2011-04-01 MED ORDER — DOXYCYCLINE HYCLATE 100 MG PO TABS
100.0000 mg | ORAL_TABLET | Freq: Two times a day (BID) | ORAL | Status: DC
  Administered 2011-04-01 – 2011-04-02 (×3): 100 mg via ORAL
  Filled 2011-04-01 (×4): qty 1

## 2011-04-01 MED ORDER — FUROSEMIDE 40 MG PO TABS
40.0000 mg | ORAL_TABLET | Freq: Two times a day (BID) | ORAL | Status: DC
  Administered 2011-04-01 – 2011-04-02 (×3): 40 mg via ORAL
  Filled 2011-04-01 (×5): qty 1

## 2011-04-01 NOTE — Progress Notes (Signed)
[facility]  Subjective: Alert and eager to discharge. No further bleeding noted by patient.   Objective: Blood pressure 130/41, pulse 66, temperature 98.4 F (36.9 C), temperature source Oral, resp. rate 13, height 5\' 2"  (1.575 m), weight 77.6 kg (171 lb 1.2 oz), SpO2 98.00%. Weight change:  Last BM Date: 03/31/11  Intake/Output from previous day: 03/13 0701 - 03/14 0700 In: 1595 [P.O.:720; I.V.:125; IV Piggyback:750] Out: 800 [Urine:800] Intake/Output this shift: Total I/O In: 240 [P.O.:240] Out: -   General appearance: alert, cooperative, appears stated age and no distress Resp: clear to auscultation bilaterally, RA Cardio: regular rate and rhythm, S1, S2 normal, no murmur, click, rub or gallop GI: soft, non-tender; bowel sounds normal; no masses,  no organomegaly Extremities: extremities normal, atraumatic, no cyanosis or edema Neurologic: Grossly normal  Lab Results:  Basename 04/01/11 0500 03/31/11 0515  WBC 6.8 6.5  HGB 9.7* 9.9*  HCT 30.6* 31.0*  PLT 310 298   BMET  Basename 04/01/11 0500 03/31/11 0515  NA 138 138  K 3.5 3.9  CL 103 99  CO2 28 29  GLUCOSE 175* 172*  BUN 17 17  CREATININE 0.93 0.96  CALCIUM 8.5 8.7   Medications:  I have reviewed the patient's current medications.  Assessment/Plan:  GI bleed/ melena, due to 2 antral ulcers/mild gastritis * Ulcers were clean based and non bleeding * Continue protonix BID. GI recommends continue BID for 12 weeks then daily. * H pylori is pending  * No NSAID's * Appreciate GI assistance- they signed off 3/14 * path pending  Anemia due to blood loss, acute * Hgb up to 10 after 2 units PRBC's yesterday and subtle drift down to 9.7 today * Follow CBC  Acute renal insufficiency, SCr 1.4 on admission * resolved * Baseline CrCl 50.6 cc- at admission down to 35.9 cc- creatinine down to 0.96  NSTEMI, secondary to demand ischemia, Troponin 0.7/CAD, CABG 1991, redo 2006, last cath Dec 2011, medical  Rx * Cardiology following * Imdur increased 3/12 * ASA resumed 3/13  H/O mechanical aortic valve replacement, St Jude 1991/Chronic anticoagulation with Coumadin * Coumadin was on hold due to ABL anemia and GIB - resumed 3/13 * Will ask pharmacy to dose coumadin  Cardiomyopathy, ischemic, EF 35-40% 2D Nov 2011/Left ventricular diastolic dysfunction, NYHA class 2 * Cards gave one time dose of IV Lasix 3/12 after PRBC's were given   Diabetes mellitus, type 2 *CBG controlled *Continue Lantus and SSI  Cellulitis of right lower extremity * Continue preadmit Doxycycline  Dyslipidemia * Continue statin- per cards note does not want to resume Tricor so they will begin Fenofibrate  HTN (hypertension) * BP controlled  Disposition * Transfer to telemetry but keep on Team 1.   LOS: 3 days   Junious Silk, ANP pager [phone]  [facility] 1 [email or web address] Password: [email or web address]  04/01/2011, 11:51 AM    I have examined the patient, reviewed the chart and discussed the plan with Junious Silk, NP. I agree with the above note.   Calvert Cantor, MD [phone]

## 2011-04-01 NOTE — Progress Notes (Signed)
Subjective:  No SOB. She does not want Tri cor, she will resume her fenofibrate after discharge. OK to tx to telemetry.  Objective:  Vital Signs in the last 24 hours: Temp:  [97.9 F (36.6 C)-98.8 F (37.1 C)] 98.4 F (36.9 C) (03/14 0734) Pulse Rate:  [66-71] 66  (03/14 0734) Resp:  [13-48] 13  (03/13 1954) BP: (103-155)/(40-70) 130/41 mmHg (03/14 0734) SpO2:  [91 %-100 %] 98 % (03/14 0734) Weight:  [77.6 kg (171 lb 1.2 oz)] 77.6 kg (171 lb 1.2 oz) (03/14 0551)  Intake/Output from previous day:  Intake/Output Summary (Last 24 hours) at 04/01/11 0755 Last data filed at 03/31/11 2209  Gross per 24 hour  Intake   1595 ml  Output    800 ml  Net    795 ml    Physical Exam: General appearance: alert, cooperative and no distress Lungs: few basilar crackles Heart: regular rate and rhythm Positive valve sounds   Rate: 68  Rhythm: normal sinus rhythm  Lab Results:  Basename 04/01/11 0500 03/31/11 0515  WBC 6.8 6.5  HGB 9.7* 9.9*  PLT 310 298    Basename 04/01/11 0500 03/31/11 0515  NA 138 138  K 3.5 3.9  CL 103 99  CO2 28 29  GLUCOSE 175* 172*  BUN 17 17  CREATININE 0.93 0.96    Basename 03/30/11 2046 03/30/11 1306  TROPONINI 0.43* 0.54*   Hepatic Function Panel  Basename 03/30/11 0610  PROT 6.0  ALBUMIN 3.5  AST 27  ALT 16  ALKPHOS 34*  BILITOT 0.8  BILIDIR --  IBILI --   No results found for this basename: CHOL in the last 72 hours  Basename 04/01/11 0500  INR 2.04*    Imaging: Dg Chest Port 1 View  03/31/2011  *RADIOLOGY REPORT*  Clinical Data: Congestive heart failure, shortness of breath  PORTABLE CHEST - 1 VIEW  Comparison: 03/29/2011  Findings: Cardiomegaly again noted.  Status post CABG.  Central mild vascular congestion without convincing edema.  Probable small left pleural effusion with left basilar atelectasis or infiltrate.  IMPRESSION: No convincing pulmonary edema.  Cardiomegaly.  Status post CABG. Probable small left basilar pleural  effusion with left basilar atelectasis or infiltrate.  Original Report Authenticated By: Natasha Mead, M.D.    Cardiac Studies:  Assessment/Plan:   Principal Problem:  *GI bleed, Hgb 7.8 on admission Active Problems:  NSTEMI, secondary to demand ischemia, Troponin 0.7  CAD, CABG 1991, redo 2006, last cath Dec 2011, medical Rx  Anemia due to blood loss, acute  Diabetes mellitus, type 2  H/O mechanical aortic valve replacement, St Jude 1991  Chronic anticoagulation with Coumadin  Cardiomyopathy, ischemic, EF 35-40% 2D Nov 2011  Cellulitis of right lower extremity  Acute renal insufficiency, SCr 1.4 on admission  Dyslipidemia  HTN (hypertension)   Plan-Coumadin resumed, ? Heparin till therapeutic. Tx to telemetry, resume home dose Lasix.   Corine Shelter PA-C 04/01/2011, 7:55 AM  Patient seen and examined. Agree with assessment and plan.  Pt feels well. No cp or SOB.  She had short bursts of NSVT earlier, currently NSR without ectopy.  INR today is therapeutic at 2.08, therefore, would not add heparin. Will give extra 25 mg Toprol today and increase to 75 mg daily. Possibly dc tomorrow.   Lennette Bihari, MD, [facility] 04/01/2011 5:06 PM

## 2011-04-01 NOTE — Progress Notes (Signed)
Patient seen, examined, and I agree with the above documentation, including the assessment and plan. Awaiting H pylori rule out from path taken recently BID ppi for 8 weeks, then daily thereafter Avoid nsaids. Call with questions.

## 2011-04-01 NOTE — Progress Notes (Signed)
     [facility] Daily Rounding Note 04/01/2011, 9:20 AM  SUBJECTIVE:       Tolerating solids, no problems.  Stool brown yesterday.  OBJECTIVE:        General: Looks well     Vital signs in last 24 hours:    Temp:  [97.9 F (36.6 C)-98.8 F (37.1 C)] 98.4 F (36.9 C) (03/14 0734) Pulse Rate:  [66-71] 66  (03/14 0734) Resp:  [13-48] 13  (03/13 1954) BP: (103-155)/(40-70) 130/41 mmHg (03/14 0734) SpO2:  [91 %-100 %] 98 % (03/14 0734) Weight:  [171 lb 1.2 oz (77.6 kg)] 171 lb 1.2 oz (77.6 kg) (03/14 0551) Last BM Date: 03/31/11  Heart: RRR Chest: Clear B Abdomen: soft, NT, ND, active BS  Extremities: no edema Neuro/Psych:  Pleasant, relaxed.  Not confused.   Intake/Output from previous day: 03/13 0701 - 03/14 0700 In: 1595 [P.O.:720; I.V.:125; IV Piggyback:750] Out: 800 [Urine:800]  Intake/Output this shift:    Lab Results:  Basename 04/01/11 0500 03/31/11 0515 03/30/11 0610  WBC 6.8 6.5 12.8*  HGB 9.7* 9.9* 10.7*  HCT 30.6* 31.0* 33.2*  PLT 310 298 312   BMET  Basename 04/01/11 0500 03/31/11 0515 03/30/11 0610  NA 138 138 137  K 3.5 3.9 4.3  CL 103 99 100  CO2 28 29 28   GLUCOSE 175* 172* 171*  BUN 17 17 26*  CREATININE 0.93 0.96 1.20*  CALCIUM 8.5 8.7 9.3   LFT  Basename 03/30/11 0610 03/29/11 1905  PROT 6.0 6.3  ALBUMIN 3.5 3.6  AST 27 26  ALT 16 16  ALKPHOS 34* 31*  BILITOT 0.8 0.4  BILIDIR -- --  IBILI -- --   PT/INR  Basename 04/01/11 0500 03/31/11 0900  LABPROT 23.4* 25.6*  INR 2.04* 2.29*   Studies/Results: Dg Chest Port 1 View  03/31/2011  *RADIOLOGY REPORT*  Clinical Data: Congestive heart failure, shortness of breath  PORTABLE CHEST - 1 VIEW  Comparison: 03/29/2011  Findings: Cardiomegaly again noted.  Status post CABG.  Central mild vascular congestion without convincing edema.  Probable small left pleural effusion with left basilar atelectasis or infiltrate.  IMPRESSION: No convincing pulmonary edema.  Cardiomegaly.  Status post CABG.  Probable small left basilar pleural effusion with left basilar atelectasis or infiltrate.  Original Report Authenticated By: Natasha Mead, M.D.    ASSESMENT: 1.  GI bleed in setting of therapeutic Coumadin.     EGD 3/13 found gastric antral ulcers, gastritis and non-obstructin Scatzki's ring. On no PPI etc PTA.  Though home med list includes Omeprazole it was not in use.  2.  ABL Anemia. S/P transfusion.  CBC stable.   3.  Redo CABG and mech AVR 2011.  Chronic Coumadin  Acute Non STEMI this  Admit.     PLAN: 1.  GI will follow up the pathology and H Pylori, but  We are signing off .  No NSAIDs.     Twice daily oral PPI for 12 weeks, then daily thereafter. Dr Renne Crigler can recheck CBC in about 2 weeks. Discussed all of this with the patient.      LOS: 3 days   Jennye Moccasin  04/01/2011, 9:20 AM Pager: [phone]

## 2011-04-01 NOTE — Consult Note (Signed)
ANTICOAGULATION CONSULT NOTE - Initial Consult  Pharmacy Consult for Coumadin Indication: mechanical aortic valve  Allergies  Allergen Reactions  . Codeine     Feels sick   Patient Measurements: Height: 5\' 2"  (157.5 cm) Weight: 171 lb 1.2 oz (77.6 kg) IBW/kg (Calculated) : 50.1   Vital Signs: Temp: 98.4 F (36.9 C) (03/14 0734) Temp src: Oral (03/14 0734) BP: 130/41 mmHg (03/14 0734) Pulse Rate: 66  (03/14 0734)  Labs:  Basename 04/01/11 0500 03/31/11 0900 03/31/11 0515 03/30/11 2046 03/30/11 1306 03/30/11 0610 03/29/11 1905  HGB 9.7* -- 9.9* -- -- -- --  HCT 30.6* -- 31.0* -- -- 33.2* --  PLT 310 -- 298 -- -- 312 --  APTT -- -- -- -- -- -- 44*  LABPROT 23.4* 25.6* -- -- -- 29.5* --  INR 2.04* 2.29* -- -- -- 2.75* --  HEPARINUNFRC -- -- -- -- -- -- --  CREATININE 0.93 -- 0.96 -- -- 1.20* --  CKTOTAL -- -- -- 116 138 128 --  CKMB -- -- -- 2.3 2.9 2.9 --  TROPONINI -- -- -- 0.43* 0.54* 0.71* --   Estimated Creatinine Clearance: 54.3 ml/min (by C-G formula based on Cr of 0.93).  Medical History: Past Medical History  Diagnosis Date  . High blood pressure   . Angina at rest   . Irregular heart rhythm   . Diabetes mellitus   . High cholesterol   . Coronary artery disease   . COPD (chronic obstructive pulmonary disease)    Medications:  Prescriptions prior to admission  Medication Sig Dispense Refill  . aspirin 81 MG tablet Take 81 mg by mouth daily.        Marland Kitchen atorvastatin (LIPITOR) 40 MG tablet Take 40 mg by mouth daily.        . calcium carbonate (OS-CAL) 600 MG TABS Take 600 mg by mouth 2 (two) times daily.        . DULoxetine (CYMBALTA) 60 MG capsule Take 60 mg by mouth daily.        . fenofibrate 54 MG tablet Take 54 mg by mouth daily.        . furosemide (LASIX) 40 MG tablet Take 40 mg by mouth 2 (two) times daily.        Marland Kitchen glimepiride (AMARYL) 1 MG tablet Take 1 mg by mouth daily.        Marland Kitchen HYDROcodone-acetaminophen (NORCO) 7.5-325 MG per tablet Take 1  tablet by mouth at bedtime as needed. For pain      . isosorbide mononitrate (IMDUR) 30 MG 24 hr tablet Take 15 mg by mouth daily.       Marland Kitchen LOSARTAN POTASSIUM PO Take 100 mg by mouth daily.        . Multiple Vitamin (MULTIVITAMIN) capsule Take 1 capsule by mouth daily.      Marland Kitchen omeprazole (PRILOSEC OTC) 20 MG tablet Take 20 mg by mouth daily before breakfast.      . POTASSIUM CHLORIDE PO Take 20 mEq by mouth. Monday, Wednesday, Friday       . temazepam (RESTORIL) 30 MG capsule Take 30 mg by mouth at bedtime as needed.      . TOPROL XL 50 MG 24 hr tablet Take 1 tablet by mouth daily.       . vitamin B-12 (CYANOCOBALAMIN) 1000 MCG tablet Take 1,000 mcg by mouth 2 (two) times daily.        Marland Kitchen warfarin (COUMADIN) 2.5 MG tablet Take 2.5 mg by  mouth daily.        . famotidine (PEPCID) 20 MG tablet Take 20 mg by mouth at bedtime.       Assessment: 70yof on coumadin pta, admitted 3/11 with GI bleed. GI consulted and EGD today did not show any active bleeding, just mild gastritis. OK to resume anticoag per GI.  INR 2.0  below goal 2.5-3.5 Only dose that was missed was 3/12. Home dose per patient is  is 2.5mg  daily except 5mg  MWF.  Goal of Therapy:  INR 2.5-3.5   Plan:  1) Coumadin 5mg  x 1 - may need a 7.5mg  boost if still dropping tomorrow  2) Daily INR  Marcelino Scot 04/01/2011,9:24 AM

## 2011-04-02 LAB — GLUCOSE, CAPILLARY
Glucose-Capillary: 158 mg/dL — ABNORMAL HIGH (ref 70–99)
Glucose-Capillary: 286 mg/dL — ABNORMAL HIGH (ref 70–99)

## 2011-04-02 LAB — PROTIME-INR: INR: 1.97 — ABNORMAL HIGH (ref 0.00–1.49)

## 2011-04-02 MED ORDER — METOPROLOL SUCCINATE ER 25 MG PO TB24: 75.0000 mg | ORAL_TABLET | Freq: Every day | ORAL | Status: DC

## 2011-04-02 MED ORDER — WARFARIN SODIUM 5 MG PO TABS
5.0000 mg | ORAL_TABLET | ORAL | Status: AC
  Administered 2011-04-02: 5 mg via ORAL
  Filled 2011-04-02: qty 1

## 2011-04-02 MED ORDER — ISOSORBIDE MONONITRATE ER 30 MG PO TB24: 30.0000 mg | ORAL_TABLET | Freq: Every day | ORAL | Status: DC

## 2011-04-02 MED ORDER — PANTOPRAZOLE SODIUM 40 MG PO TBEC: 40.0000 mg | DELAYED_RELEASE_TABLET | Freq: Two times a day (BID) | ORAL | Status: DC

## 2011-04-02 MED ORDER — WARFARIN SODIUM 2.5 MG PO TABS: ORAL_TABLET | ORAL | Status: DC

## 2011-04-02 MED ORDER — WARFARIN - PHARMACIST DOSING INPATIENT: Freq: Every day | Status: DC

## 2011-04-02 NOTE — Progress Notes (Signed)
[facility]  Subjective: No Complaints.  Breathing better.  Objective: Vital signs in last 24 hours: Temp:  [97.4 F (36.3 C)-98.4 F (36.9 C)] 97.4 F (36.3 C) (03/15 0552) Pulse Rate:  [63-70] 63  (03/15 0552) Resp:  [16-18] 18  (03/15 0552) BP: (118-142)/(42-83) 142/55 mmHg (03/15 0552) SpO2:  [95 %-100 %] 95 % (03/15 0552) Weight:  [78.245 kg (172 lb 8 oz)] 78.245 kg (172 lb 8 oz) (03/15 0552) Last BM Date: 04/01/11  Intake/Output from previous day: 03/14 0701 - 03/15 0700 In: 1380 [P.O.:1380] Out: 3850 [Urine:3850] Intake/Output this shift:    Medications Current Facility-Administered Medications  Medication Dose Route Frequency Provider Last Rate Last Dose  . acetaminophen (TYLENOL) tablet 650 mg  650 mg Oral Q6H PRN Eduard Clos, MD       Or  . acetaminophen (TYLENOL) suppository 650 mg  650 mg Rectal Q6H PRN Eduard Clos, MD      . atorvastatin (LIPITOR) tablet 40 mg  40 mg Oral q1800 Lonia Blood, MD   40 mg at 04/01/11 1744  . doxycycline (VIBRA-TABS) tablet 100 mg  100 mg Oral Q12H Calvert Cantor, MD   100 mg at 04/01/11 2127  . DULoxetine (CYMBALTA) DR capsule 60 mg  60 mg Oral Daily Eduard Clos, MD   60 mg at 04/01/11 1057  . furosemide (LASIX) tablet 40 mg  40 mg Oral BID Abelino Derrick, PA   40 mg at 04/02/11 0836  . HYDROcodone-acetaminophen (NORCO) 5-325 MG per tablet 1.5 tablet  1.5 tablet Oral QHS PRN Eduard Clos, MD   1.5 tablet at 04/01/11 2133  . insulin aspart (novoLOG) injection 0-15 Units  0-15 Units Subcutaneous TID WC Lonia Blood, MD   3 Units at 04/02/11 [phone]  . insulin aspart (novoLOG) injection 0-5 Units  0-5 Units Subcutaneous QHS Lonia Blood, MD   2 Units at 04/01/11 2140  . insulin glargine (LANTUS) injection 10 Units  10 Units Subcutaneous QHS Eda Paschal [place], [place]   10 Units at 04/01/11 2140  . isosorbide mononitrate (IMDUR) 24 hr tablet 30 mg  30 mg Oral Daily Eda Paschal  [place], [place]   30 mg at 04/01/11 1057  . metoprolol succinate (TOPROL-XL) 24 hr tablet 25 mg  25 mg Oral Once Nada Boozer, NP   25 mg at 04/01/11 1818  . metoprolol succinate (TOPROL-XL) 24 hr tablet 75 mg  75 mg Oral Daily Nada Boozer, NP      . ondansetron [facility]) tablet 4 mg  4 mg Oral Q6H PRN Eduard Clos, MD       Or  . ondansetron [facility]) injection 4 mg  4 mg Intravenous Q6H PRN Eduard Clos, MD      . pantoprazole (PROTONIX) EC tablet 40 mg  40 mg Oral BID AC Beverley Fiedler, MD   40 mg at 04/02/11 0607  . sodium chloride 0.9 % injection 3 mL  3 mL Intravenous Q12H Eduard Clos, MD   3 mL at 04/01/11 2127  . temazepam (RESTORIL) capsule 15 mg  15 mg Oral QHS PRN Caroline More, NP   15 mg at 03/30/11 2311  . Warfarin - Pharmacist Dosing Inpatient   Does not apply q1800 Lonia Blood, MD      . DISCONTD: doxycycline (VIBRAMYCIN) 100 mg in dextrose 5 % 250 mL IVPB  100 mg Intravenous BID Eduard Clos, MD   100 mg  at 03/31/11 2209  . DISCONTD: metoprolol succinate (TOPROL-XL) 24 hr tablet 50 mg  50 mg Oral Daily Eduard Clos, MD   50 mg at 04/01/11 1058    PE: General appearance: alert, cooperative and no distress Lungs: Mild rales left base Heart: regular rate and rhythm, 1/6 soft sys mm, crisp valve clicks. Extremities: No LEE Pulses: Radials 2+ and symmetric, 1+ right DP, left DP nonpalpable but foot is warm.   Lab Results:   Basename 04/01/11 0500 03/31/11 0515  WBC 6.8 6.5  HGB 9.7* 9.9*  HCT 30.6* 31.0*  PLT 310 298   BMET  Basename 04/01/11 0500 03/31/11 0515  NA 138 138  K 3.5 3.9  CL 103 99  CO2 28 29  GLUCOSE 175* 172*  BUN 17 17  CREATININE 0.93 0.96  CALCIUM 8.5 8.7   PT/INR  Basename 04/02/11 0630 04/01/11 0500 03/31/11 0900  LABPROT 22.8* 23.4* 25.6*  INR 1.97* 2.04* 2.29*     Assessment/Plan   Principal Problem:  *GI bleed, Hgb 7.8 on admission Active Problems:  CAD, CABG 1991, redo 2006, last cath Dec  2011, medical Rx  Anemia due to blood loss, acute  Diabetes mellitus, type 2  Cellulitis of right lower extremity  Acute renal insufficiency, SCr 1.4 on admission  H/O mechanical aortic valve replacement, St Jude 1991  Chronic anticoagulation with Coumadin  Dyslipidemia  HTN (hypertension)  Cardiomyopathy, ischemic, EF 35-40% 2D Nov 2011  NSTEMI, secondary to demand ischemia, Troponin 0.7  Gastric ulcer  Plan:  Hgb stable. 3 beat NSVT this AM.  Toprol XL increase to 75mg  daily yesterday.  BP 118/83 - 142/55.  Net 2.7 liters out yesterday. Back on lasix 40mg  bid po.   INR just barely subtherapeutic.  Can likely DC home today from Cards standpoint.    LOS: 4 days    HAGER,BRYAN W 04/02/2011 9:23 AM   Patient seen and examined. Agree with assessment and plan.  Feels well. It does not appear that pt received coumadin yesterday.  Will give dose now.  Tolerating increased Toprol at 75 mg.  Will DC today and F/U with Dr. Clarene Duke.   Lennette Bihari, MD, [facility] 04/02/2011 9:46 AM

## 2011-04-02 NOTE — Discharge Summary (Signed)
DISCHARGE SUMMARY  [name]  MR#: [id number]  DOB:06/08/1940  Date of Admission: 03/29/2011 Date of Discharge: 04/02/2011  Attending Physician:Henchy Mccauley Mordecai Rasmussen, MD  Patient's WUJ:WJXBJ,YNWGNF DAVIDSON, MD, MD  Consults: Thurmon Fair, MD, FACC/ [facility] Erick Blinks, MD/gastroenterology  Pertinent discharge information: *Cardiology increased the Toprol to 75 mg this admission. *Was started on twice a day Protonix this admission. Gastroenterology recommends continuing twice a day dosing for 12 weeks then decrease to daily dosing *H. pylori studies are pending at time of discharge  Discharge Diagnoses: Principal Problem:  *GI bleed, Hgb 7.8 on admission Active Problems:  Anemia due to blood loss, acute  Acute renal insufficiency, SCr 1.4 on admission  NSTEMI, secondary to demand ischemia, Troponin 0.7  CAD, CABG 1991, redo 2006, last cath Dec 2011, medical Rx  H/O mechanical aortic valve replacement, St Jude 1991  Chronic anticoagulation with Coumadin  Cardiomyopathy, ischemic, EF 35-40% 2D Nov 2011  Diabetes mellitus, type 2  Cellulitis of right lower extremity  Dyslipidemia  HTN (hypertension)  Gastric ulcer   Radiology: Dg Chest Port 1 View  03/31/2011  *RADIOLOGY REPORT*  Clinical Data: Congestive heart failure, shortness of breath  PORTABLE CHEST - 1 VIEW  Comparison: 03/29/2011  Findings: Cardiomegaly again noted.  Status post CABG.  Central mild vascular congestion without convincing edema.  Probable small left pleural effusion with left basilar atelectasis or infiltrate.  IMPRESSION: No convincing pulmonary edema.  Cardiomegaly.  Status post CABG. Probable small left basilar pleural effusion with left basilar atelectasis or infiltrate.  Original Report Authenticated By: Natasha Mead, M.D.   Dg Chest Portable 1 View  03/29/2011  *RADIOLOGY REPORT*  Clinical Data: Chest pain  PORTABLE CHEST - 1 VIEW  Comparison: 01/22/2011  Findings:  Cardiomegaly again noted.  Status post CABG.  No acute infiltrate or pleural effusion.  No pulmonary edema.  Bony thorax is stable.  IMPRESSION: Cardiomegaly again noted.  Status post CABG.  No active disease.  Original Report Authenticated By: Natasha Mead, M.D.    Laboratory: Results for orders placed during the hospital encounter of 03/29/11 (from the past 48 hour(s))  GLUCOSE, CAPILLARY     Status: Abnormal   Collection Time   03/31/11  4:10 PM      Component Value Range Comment   Glucose-Capillary 223 (*) 70 - 99 (mg/dL)   GLUCOSE, CAPILLARY     Status: Abnormal   Collection Time   03/31/11  9:36 PM      Component Value Range Comment   Glucose-Capillary 211 (*) 70 - 99 (mg/dL)    Comment 1 Documented in Chart      Comment 2 Notify RN     BASIC METABOLIC PANEL     Status: Abnormal   Collection Time   04/01/11  5:00 AM      Component Value Range Comment   Sodium 138  135 - 145 (mEq/L)    Potassium 3.5  3.5 - 5.1 (mEq/L)    Chloride 103  96 - 112 (mEq/L)    CO2 28  19 - 32 (mEq/L)    Glucose, Bld 175 (*) 70 - 99 (mg/dL)    BUN 17  6 - 23 (mg/dL)    Creatinine, Ser 6.21  0.50 - 1.10 (mg/dL)    Calcium 8.5  8.4 - 10.5 (mg/dL)    GFR calc non Af Amer 61 (*) >90 (mL/min)    GFR calc Af Amer 71 (*) >90 (mL/min)   CBC  Status: Abnormal   Collection Time   04/01/11  5:00 AM      Component Value Range Comment   WBC 6.8  4.0 - 10.5 (K/uL)    RBC 3.28 (*) 3.87 - 5.11 (MIL/uL)    Hemoglobin 9.7 (*) 12.0 - 15.0 (g/dL)    HCT 96.2 (*) 95.2 - 46.0 (%)    MCV 93.3  78.0 - 100.0 (fL)    MCH 29.6  26.0 - 34.0 (pg)    MCHC 31.7  30.0 - 36.0 (g/dL)    RDW 84.1 (*) 32.4 - 15.5 (%)    Platelets 310  150 - 400 (K/uL)   PROTIME-INR     Status: Abnormal   Collection Time   04/01/11  5:00 AM      Component Value Range Comment   Prothrombin Time 23.4 (*) 11.6 - 15.2 (seconds)    INR 2.04 (*) 0.00 - 1.49    GLUCOSE, CAPILLARY     Status: Abnormal   Collection Time   04/01/11  7:38 AM       Component Value Range Comment   Glucose-Capillary 160 (*) 70 - 99 (mg/dL)   GLUCOSE, CAPILLARY     Status: Abnormal   Collection Time   04/01/11 12:44 PM      Component Value Range Comment   Glucose-Capillary 227 (*) 70 - 99 (mg/dL)   GLUCOSE, CAPILLARY     Status: Abnormal   Collection Time   04/01/11  4:43 PM      Component Value Range Comment   Glucose-Capillary 219 (*) 70 - 99 (mg/dL)    Comment 1 Documented in Chart      Comment 2 Notify RN     GLUCOSE, CAPILLARY     Status: Abnormal   Collection Time   04/01/11  9:30 PM      Component Value Range Comment   Glucose-Capillary 214 (*) 70 - 99 (mg/dL)    Comment 1 Notify RN     GLUCOSE, CAPILLARY     Status: Abnormal   Collection Time   04/02/11  5:50 AM      Component Value Range Comment   Glucose-Capillary 158 (*) 70 - 99 (mg/dL)   PROTIME-INR     Status: Abnormal   Collection Time   04/02/11  6:30 AM      Component Value Range Comment   Prothrombin Time 22.8 (*) 11.6 - 15.2 (seconds)    INR 1.97 (*) 0.00 - 1.49    GLUCOSE, CAPILLARY     Status: Abnormal   Collection Time   04/02/11 11:22 AM      Component Value Range Comment   Glucose-Capillary 286 (*) 70 - 99 (mg/dL)      Medication List  As of 04/02/2011  1:37 PM   STOP taking these medications         famotidine 20 MG tablet      omeprazole 20 MG tablet         TAKE these medications         aspirin 81 MG tablet   Take 81 mg by mouth daily.      atorvastatin 40 MG tablet   Commonly known as: LIPITOR   Take 40 mg by mouth daily.      calcium carbonate 600 MG Tabs   Commonly known as: OS-CAL   Take 600 mg by mouth 2 (two) times daily.      DULoxetine 60 MG capsule   Commonly known as: CYMBALTA  Take 60 mg by mouth daily.      fenofibrate 54 MG tablet   Take 54 mg by mouth daily.      furosemide 40 MG tablet   Commonly known as: LASIX   Take 40 mg by mouth 2 (two) times daily.      glimepiride 1 MG tablet   Commonly known as: AMARYL   Take 1 mg  by mouth daily.      HYDROcodone-acetaminophen 7.5-325 MG per tablet   Commonly known as: NORCO   Take 1 tablet by mouth at bedtime as needed. For pain      isosorbide mononitrate 30 MG 24 hr tablet   Commonly known as: IMDUR   Take 1 tablet (30 mg total) by mouth daily.      LOSARTAN POTASSIUM PO   Take 100 mg by mouth daily.      metoprolol succinate 25 MG 24 hr tablet   Commonly known as: TOPROL-XL   Take 3 tablets (75 mg total) by mouth daily. Take with or immediately following a meal.      multivitamin capsule   Take 1 capsule by mouth daily.      pantoprazole 40 MG tablet   Commonly known as: PROTONIX   Take 1 tablet (40 mg total) by mouth 2 (two) times daily before a meal.      POTASSIUM CHLORIDE PO   Take 20 mEq by mouth. Monday, Wednesday, Friday      temazepam 30 MG capsule   Commonly known as: RESTORIL   Take 30 mg by mouth at bedtime as needed.      vitamin B-12 1000 MCG tablet   Commonly known as: CYANOCOBALAMIN   Take 1,000 mcg by mouth 2 (two) times daily.      warfarin 2.5 MG tablet   Commonly known as: COUMADIN   Take 2.5 mg by mouth daily on Sun, Tues, Thurs & Sat    Take 5mg  on Mon, Wed & Fri        History of present illness: 71 year old female known history of CAD with drug-eluting stents as well as mechanical aortic valve on chronic Coumadin. Presented to the ER the day of admission with chest pain and shortness of breath. Pain ongoing less than 24 hours was retrosternal located pressure-like. Because of ongoing pain her family convinced her to come to the emergency department where she was found to have a mildly elevated troponin but was also found to be anemic with a hemoglobin of 7. Her baseline hemoglobin is around 11. Fecal occult blood was also positive. After further questioning the patient did reveal she had been having black stools for 2 days which she attributed to antibiotics she had recently been prescribed for right lower extremity  cellulitis. The ER physician Dr. Bing Matter contacted the on-call cardiologist Dr. Tresa Endo who advised holding Coumadin and also endorsed that cardiology would be evaluated patient's admission. At presentation she was hemodynamically stable with a blood pressure of 1 4456 and pulse of 79. In the blood was 7.8. Her platelet count was normal her INR was therapeutic at 3.58. She also had mild elevation in BUN and creatinine of 30 and 1.48.  Hospital Course: Principal Problem:  *GI bleed, Hgb 7.8 on admission Active Problems:  Anemia due to blood loss, acute  Acute renal insufficiency, SCr 1.4 on admission  NSTEMI, secondary to demand ischemia, Troponin 0.7  CAD, CABG 1991, redo 2006, last cath Dec 2011, medical Rx  H/O mechanical aortic valve replacement,  St Jude 1991  Chronic anticoagulation with Coumadin  Cardiomyopathy, ischemic, EF 35-40% 2D Nov 2011  Diabetes mellitus, type 2  Cellulitis of right lower extremity  Dyslipidemia  HTN (hypertension)  Gastric ulcer  GI bleed/ melena, due to 2 antral ulcers/mild gastritis Because the cardiac history an acute GI bleed symptoms the patient was admitted to the step down ICU for monitoring. Gastroenterology was consulted. She was initially started on IV Protonix infusion. She subsequently underwent EGD which revealed 2 antral ulcers which were nonbleeding as well as mild gastritis. Subsequently the Protonix was changed to oral dosing with recommendations to continue for a total of 12 weeks and then decrease to daily dosing. She had no further bleeding since admission. Gastroenterology recommended to resume aspirin and Coumadin this hospitalization.  Anemia due to blood loss, acute Received 2 units of packed red blood cells this admission. On date of discharge hemoglobin was stable at 9.7.  Acute renal insufficiency, SCr 1.4 on admission Presented with elevated creatinine 1.48 with an estimated creatinine clearance at that time of 35.9. After rehydration  and restoration of blood volume with packed red blood cells patient's creatinine returned to baseline.  NSTEMI, secondary to demand ischemia Cardiology follow along with this this hospitalization. Enzymes and EKG remained normal without evidence of acute ischemia. The attending cardiology felt that angina was not unexpected in view of the severity of her anemia and known coronary anatomy. Imdur and Toprol were increased this admission.  H/O mechanical aortic valve replacement, St Jude 1991/Chronic anticoagulation with Coumadin Coumadin and aspirin were placed on hold acute blood loss anemia and GI bleeding and was subsequently resumed on 03/31/2011. Was evaluated by cardiology on date of discharge and per his review of the chart it did not appear that the patient receive her Coumadin as ordered on 03/31/2012 so was given a dose this morning. INR 1.97 on date of discharge the cardiology feels it is appropriate to discharge the patient home and followup with her primary cardiologist Dr. Clarene Duke.  Diabetes mellitus, type 2 CBGs have remained well controlled this admission on Lantus and sliding scale insulin. Resume home medications at discharge.  Dyslipidemia Cardiology notes that the patient does not wish to resume [employer] after discharge today we'll begin fenofibrate in addition to her other dyslipidemic agents  Dyslipidemia Patient was on doxycycline preadmission and this was continued  HTN (hypertension) Blood pressure has remained well controlled this hospitalization  Day of Discharge BP 142/55  Pulse 63  Temp(Src) 97.4 F (36.3 C) (Oral)  Resp 18  Ht 5\' 2"  (1.575 m)  Wt 78.245 kg (172 lb 8 oz)  BMI 31.55 kg/m2  SpO2 95%  Physical Exam:  General appearance: alert, cooperative, appears stated age and no distress Resp: clear to auscultation bilaterally Cardio: regular rate and rhythm, S1, S2 normal, no murmur, click, rub or gallop GI: soft, non-tender; bowel sounds normal; no  masses,  no organomegaly Extremities: extremities normal, atraumatic, no cyanosis or edema Neurologic: Grossly normal  Follow-up: #1 Dr. Renne Crigler for call to be seen in 2 weeks for routine hospital followup  #2 call Dr. Gaspar Garbe Little's office to be seen in 2 weeks for routine hospital followup guarding medication adjustments made during the hospitalization #3 call Dr. Chestine Spore and arrange for followup in 4 weeks regarding routine hospital followup for GI bleeding with symptomatic anemia  Disposition:  Discharge to home and private vehicle  Junious Silk, ANP pager [phone]

## 2011-04-06 ENCOUNTER — Encounter: Payer: Self-pay | Admitting: Internal Medicine

## 2011-05-31 ENCOUNTER — Encounter: Payer: Self-pay | Admitting: Neurosurgery

## 2011-06-01 ENCOUNTER — Ambulatory Visit (INDEPENDENT_AMBULATORY_CARE_PROVIDER_SITE_OTHER): Payer: Medicare Other | Admitting: Neurosurgery

## 2011-06-01 ENCOUNTER — Encounter (INDEPENDENT_AMBULATORY_CARE_PROVIDER_SITE_OTHER): Payer: Medicare Other | Admitting: *Deleted

## 2011-06-01 ENCOUNTER — Encounter: Payer: Self-pay | Admitting: Neurosurgery

## 2011-06-01 ENCOUNTER — Ambulatory Visit (INDEPENDENT_AMBULATORY_CARE_PROVIDER_SITE_OTHER): Payer: Medicare Other | Admitting: *Deleted

## 2011-06-01 VITALS — BP 155/68 | HR 71 | Resp 16 | Ht 62.0 in | Wt 181.0 lb

## 2011-06-01 DIAGNOSIS — I739 Peripheral vascular disease, unspecified: Secondary | ICD-10-CM

## 2011-06-01 DIAGNOSIS — Z48812 Encounter for surgical aftercare following surgery on the circulatory system: Secondary | ICD-10-CM

## 2011-06-01 DIAGNOSIS — I70219 Atherosclerosis of native arteries of extremities with intermittent claudication, unspecified extremity: Secondary | ICD-10-CM | POA: Insufficient documentation

## 2011-06-01 DIAGNOSIS — I714 Abdominal aortic aneurysm, without rupture, unspecified: Secondary | ICD-10-CM | POA: Insufficient documentation

## 2011-06-01 NOTE — Progress Notes (Signed)
[facility] [place] HISTORY AND PHYSICAL   CC: Six-month followup for lower extremity graft duplex and ABIs Referring Physician: Early  History of Present Illness: 71 year old female patient of Dr. Arbie Cookey so underwent a right lower extremity bypass graft in 2003. She reports no symptoms of claudication today she has no complaints other than her chronic problem of pain in legs with walking left greater than right she does have some pain with rest and a history of varicose vein veins as well as issues with bleeding but none of this is new it has basically been a chronic problem.  Past Medical History  Diagnosis Date  . High blood pressure   . Angina at rest   . Irregular heart rhythm   . Diabetes mellitus   . High cholesterol   . Coronary artery disease   . COPD (chronic obstructive pulmonary disease)   . Anemia   . Irregular heart beat   . CHF (congestive heart failure)   . Myocardial infarction march 2013  . Peripheral vascular disease     ROS: [x]  Positive   [ ]  Denies    General: [ ]  Weight loss, [ ]  Fever, [ ]  chills Neurologic: [ ]  Dizziness, [ ]  Blackouts, [ ]  Seizure [ ]  Stroke, [ ]  "Mini stroke", [ ]  Slurred speech, [ ]  Temporary blindness; [ ]  weakness in arms or legs, [ ]  Hoarseness Cardiac: [x ] Chest pain/pressure, [ ]  Shortness of breath at rest [x ] Shortness of breath with exertion, [ ]  Atrial fibrillation or irregular heartbeat Vascular: [ x] Pain in legs with walking, [x ] Pain in legs at rest, [ ]  Pain in legs at night,  [ ]  Non-healing ulcer, [ ]  Blood clot in vein/DVT,   Pulmonary: [ ]  Home oxygen, [ x] Productive cough, [ ]  Coughing up blood, [ ]  Asthma,  [ ]  Wheezing Musculoskeletal:  [ ]  Arthritis, [ ]  Low back pain, [ ]  Joint pain Hematologic: [ ]  Easy Bruising, [ ]  Anemia; [ ]  Hepatitis Gastrointestinal: [ ]  Blood in stool, [ ]  Gastroesophageal Reflux/heartburn, [ ]  Trouble swallowing Urinary: [ ]  chronic Kidney disease, [ ]  on HD - [  ] MWF or [ ]  TTHS, [ ]  Burning with urination, [ ]  Difficulty urinating Skin: [ ]  Rashes, [x ] Wounds Psychological: [ ]  Anxiety, [ ]  Depression   Social History History  Substance Use Topics  . Smoking status: Former Smoker -- 0.5 packs/day for 15 years    Types: Cigarettes    Quit date: 01/18/1973  . Smokeless tobacco: Never Used  . Alcohol Use: No    Family History Family History  Problem Relation Age of Onset  . Rheum arthritis Mother   . Cervical cancer Mother   . Stomach cancer Mother   . Cancer Mother   . Hyperlipidemia Mother   . Hypertension Mother   . Rheum arthritis Father   . Rheum arthritis Paternal Aunt   . Anesthesia problems Neg Hx   . Deep vein thrombosis Brother   . Hypertension Brother   . Hypertension Daughter     Allergies  Allergen Reactions  . Codeine     Feels sick    Current Outpatient Prescriptions  Medication Sig Dispense Refill  . aspirin 81 MG tablet Take 81 mg by mouth daily.        . calcium carbonate (OS-CAL) 600 MG TABS Take 600 mg by mouth 2 (two) times daily.        Marland Kitchen  DULoxetine (CYMBALTA) 60 MG capsule Take 60 mg by mouth daily.        . fenofibrate 54 MG tablet Take 54 mg by mouth daily.        . furosemide (LASIX) 40 MG tablet Take 40 mg by mouth 2 (two) times daily.        Marland Kitchen glimepiride (AMARYL) 1 MG tablet Take 1 mg by mouth daily.        Marland Kitchen HYDROcodone-acetaminophen (NORCO) 7.5-325 MG per tablet Take 1 tablet by mouth at bedtime as needed. For pain      . isosorbide mononitrate (IMDUR) 30 MG 24 hr tablet Take 1 tablet (30 mg total) by mouth daily.  30 tablet  0  . metoprolol succinate (TOPROL-XL) 25 MG 24 hr tablet Take 50 mg by mouth daily. Take with or immediately following a meal.      . Multiple Vitamin (MULTIVITAMIN) capsule Take 1 capsule by mouth daily.      . mupirocin ointment (BACTROBAN) 2 % Apply topically as needed.      . Omeprazole 20 MG TBEC Take 20 mg by mouth at bedtime.      Marland Kitchen POTASSIUM CHLORIDE PO Take 20  mEq by mouth. Monday, Wednesday, Friday       . PRESCRIPTION MEDICATION Inject 54 Units into the skin daily.      . temazepam (RESTORIL) 30 MG capsule Take 30 mg by mouth at bedtime as needed.      . vitamin B-12 (CYANOCOBALAMIN) 1000 MCG tablet Take 1,000 mcg by mouth 2 (two) times daily.        Marland Kitchen warfarin (COUMADIN) 2.5 MG tablet Take 2.5 mg on Sun, Tues, Thurs & Sat Take 5 mg on Mon, Wed & Fri.      . DISCONTD: metoprolol succinate (TOPROL XL) 25 MG 24 hr tablet Take 3 tablets (75 mg total) by mouth daily. Take with or immediately following a meal.  30 tablet  0  . atorvastatin (LIPITOR) 40 MG tablet Take 40 mg by mouth daily.        Marland Kitchen LOSARTAN POTASSIUM PO Take 100 mg by mouth daily.        . pantoprazole (PROTONIX) 40 MG tablet Take 1 tablet (40 mg total) by mouth 2 (two) times daily before a meal.  60 tablet  3  . DISCONTD: famotidine (PEPCID) 20 MG tablet Take 20 mg by mouth at bedtime.      Marland Kitchen DISCONTD: omeprazole (PRILOSEC OTC) 20 MG tablet Take 20 mg by mouth daily before breakfast.        Physical Examination  Filed Vitals:   06/01/11 1014  BP: 155/68  Pulse: 71  Resp: 16    Body mass index is 33.11 kg/(m^2).  General:  WDWN in NAD Gait: Normal HEENT: WNL Eyes: Pupils equal Pulmonary: normal non-labored breathing , without Rales, rhonchi,  wheezing Cardiac: RRR, without  Murmurs, rubs or gallops; No carotid bruits Abdomen: soft, NT, no masses Skin: no rashes, ulcers noted Vascular Exam/Pulses: Patient has palpable femoral pulses bilaterally as well as DP and PT which are diminished on the left  Extremities without ischemic changes, no Gangrene , no cellulitis; no open wounds;  Musculoskeletal: no muscle wasting or atrophy  Neurologic: A&O X 3; Appropriate Affect ; SENSATION: normal; MOTOR FUNCTION:  moving all extremities equally. Speech is fluent/normal  Non-Invasive Vascular Imaging: ABIs today were 1.23 and monophasic to biphasic on the right and 0.82 monophasic  to biphasic on the left bypass graft evaluation  shows no elevated velocities to the graft. ASSESSMENT/PLAN: Assessment as above, plan will be for the patient followup here in one year for repeat lower extremity ABIs and bypass graft evaluation. Her questions were encouraged and answered, she is in agreement with this plan. Sharlee Blew ANP  Clinic M.D.: Early

## 2011-06-01 NOTE — Progress Notes (Signed)
Addended by: Sharee Pimple on: 06/01/2011 12:45 PM   Modules accepted: Orders

## 2011-06-01 NOTE — Progress Notes (Signed)
Addended by: Sharee Pimple on: 06/01/2011 12:26 PM   Modules accepted: Orders

## 2011-06-03 NOTE — Procedures (Unsigned)
BYPASS GRAFT EVALUATION  INDICATION:  Followup right lower extremity bypass graft  HISTORY: Diabetes:  Yes Cardiac:  CABG Hypertension:  Yes Smoking:  Yes Previous Surgery:  Right femoral to popliteal bypass graft on 07/18/2001  SINGLE LEVEL ARTERIAL EXAM                              RIGHT              LEFT Brachial: Anterior tibial: Posterior tibial: Peroneal: Ankle/brachial index:        1.23               0.82  PREVIOUS ABI:  Date:  03/05/2010  RIGHT:  =1.16  LEFT:  =0.90  LOWER EXTREMITY BYPASS GRAFT DUPLEX EXAM:  DUPLEX:  Biphasic waveforms noted throughout the right lower extremity bypass graft with a slightly elevated velocity at the right proximal anastomosis.  IMPRESSION: 1. Patent right femoral to popliteal bypass graft. 2. Right ankle brachial indices are within normal limits and stable     compared to the last exam. 3. Left ankle brachial indices are suggestive of mild arterial disease     and have declined slightly compared to the last exam.  ___________________________________________ Larina Earthly, M.D.  EM/MEDQ  D:  06/01/2011  T:  06/01/2011  Job:  [id number]

## 2011-06-24 ENCOUNTER — Ambulatory Visit: Payer: Medicare Other | Attending: Internal Medicine

## 2011-06-24 DIAGNOSIS — M545 Low back pain, unspecified: Secondary | ICD-10-CM | POA: Insufficient documentation

## 2011-06-24 DIAGNOSIS — IMO0001 Reserved for inherently not codable concepts without codable children: Secondary | ICD-10-CM | POA: Insufficient documentation

## 2011-06-24 DIAGNOSIS — M25559 Pain in unspecified hip: Secondary | ICD-10-CM | POA: Insufficient documentation

## 2011-06-24 DIAGNOSIS — M2560 Stiffness of unspecified joint, not elsewhere classified: Secondary | ICD-10-CM | POA: Insufficient documentation

## 2011-07-05 ENCOUNTER — Ambulatory Visit: Payer: Medicare Other | Admitting: Physical Therapy

## 2011-07-07 ENCOUNTER — Ambulatory Visit: Payer: Medicare Other

## 2011-07-14 ENCOUNTER — Ambulatory Visit: Payer: Medicare Other | Admitting: Physical Therapy

## 2011-07-20 ENCOUNTER — Ambulatory Visit: Payer: Medicare Other | Attending: Internal Medicine

## 2011-07-20 DIAGNOSIS — M545 Low back pain, unspecified: Secondary | ICD-10-CM | POA: Insufficient documentation

## 2011-07-20 DIAGNOSIS — IMO0001 Reserved for inherently not codable concepts without codable children: Secondary | ICD-10-CM | POA: Insufficient documentation

## 2011-07-20 DIAGNOSIS — M2560 Stiffness of unspecified joint, not elsewhere classified: Secondary | ICD-10-CM | POA: Insufficient documentation

## 2011-07-20 DIAGNOSIS — M25559 Pain in unspecified hip: Secondary | ICD-10-CM | POA: Insufficient documentation

## 2011-12-27 ENCOUNTER — Other Ambulatory Visit (HOSPITAL_COMMUNITY): Payer: Self-pay | Admitting: Cardiovascular Disease

## 2011-12-27 DIAGNOSIS — Z952 Presence of prosthetic heart valve: Secondary | ICD-10-CM

## 2011-12-27 DIAGNOSIS — R0602 Shortness of breath: Secondary | ICD-10-CM

## 2011-12-27 DIAGNOSIS — I509 Heart failure, unspecified: Secondary | ICD-10-CM

## 2011-12-27 DIAGNOSIS — I2510 Atherosclerotic heart disease of native coronary artery without angina pectoris: Secondary | ICD-10-CM

## 2012-01-06 ENCOUNTER — Ambulatory Visit (HOSPITAL_COMMUNITY)
Admission: RE | Admit: 2012-01-06 | Discharge: 2012-01-06 | Disposition: A | Discharge status: Home / Self Care | Payer: Medicare Other | Source: Ambulatory Visit | Attending: Cardiovascular Disease | Admitting: Cardiovascular Disease

## 2012-01-06 DIAGNOSIS — I10 Essential (primary) hypertension: Secondary | ICD-10-CM | POA: Insufficient documentation

## 2012-01-06 DIAGNOSIS — E119 Type 2 diabetes mellitus without complications: Secondary | ICD-10-CM | POA: Insufficient documentation

## 2012-01-06 DIAGNOSIS — R0602 Shortness of breath: Secondary | ICD-10-CM

## 2012-01-06 DIAGNOSIS — I2510 Atherosclerotic heart disease of native coronary artery without angina pectoris: Secondary | ICD-10-CM | POA: Insufficient documentation

## 2012-01-06 DIAGNOSIS — I509 Heart failure, unspecified: Secondary | ICD-10-CM

## 2012-01-06 DIAGNOSIS — Z952 Presence of prosthetic heart valve: Secondary | ICD-10-CM

## 2012-01-06 DIAGNOSIS — Z954 Presence of other heart-valve replacement: Secondary | ICD-10-CM | POA: Insufficient documentation

## 2012-01-06 NOTE — Progress Notes (Signed)
2D Echo Performed 01/06/2012    Zeferino Mounts, RCS  

## 2012-02-01 ENCOUNTER — Other Ambulatory Visit: Payer: Self-pay

## 2012-03-30 ENCOUNTER — Inpatient Hospital Stay (HOSPITAL_COMMUNITY)
Admission: EM | Admit: 2012-03-30 | Discharge: 2012-04-07 | DRG: 246 | Disposition: A | Discharge status: Home / Self Care | Payer: Medicare Other | Attending: Cardiovascular Disease | Admitting: Cardiovascular Disease

## 2012-03-30 ENCOUNTER — Emergency Department (HOSPITAL_COMMUNITY): Payer: Medicare Other

## 2012-03-30 ENCOUNTER — Inpatient Hospital Stay
Admission: AD | Admit: 2012-03-30 | Payer: Self-pay | Source: Other Acute Inpatient Hospital | Admitting: Cardiovascular Disease

## 2012-03-30 DIAGNOSIS — F3289 Other specified depressive episodes: Secondary | ICD-10-CM | POA: Diagnosis present

## 2012-03-30 DIAGNOSIS — Z888 Allergy status to other drugs, medicaments and biological substances status: Secondary | ICD-10-CM

## 2012-03-30 DIAGNOSIS — Z952 Presence of prosthetic heart valve: Secondary | ICD-10-CM

## 2012-03-30 DIAGNOSIS — R7989 Other specified abnormal findings of blood chemistry: Secondary | ICD-10-CM

## 2012-03-30 DIAGNOSIS — E785 Hyperlipidemia, unspecified: Secondary | ICD-10-CM

## 2012-03-30 DIAGNOSIS — Z8049 Family history of malignant neoplasm of other genital organs: Secondary | ICD-10-CM

## 2012-03-30 DIAGNOSIS — E875 Hyperkalemia: Secondary | ICD-10-CM

## 2012-03-30 DIAGNOSIS — I2510 Atherosclerotic heart disease of native coronary artery without angina pectoris: Secondary | ICD-10-CM

## 2012-03-30 DIAGNOSIS — I2589 Other forms of chronic ischemic heart disease: Secondary | ICD-10-CM | POA: Diagnosis present

## 2012-03-30 DIAGNOSIS — Z951 Presence of aortocoronary bypass graft: Secondary | ICD-10-CM

## 2012-03-30 DIAGNOSIS — R0789 Other chest pain: Secondary | ICD-10-CM | POA: Diagnosis not present

## 2012-03-30 DIAGNOSIS — Z800 Family history of malignant neoplasm of digestive organs: Secondary | ICD-10-CM

## 2012-03-30 DIAGNOSIS — I472 Ventricular tachycardia, unspecified: Secondary | ICD-10-CM | POA: Diagnosis not present

## 2012-03-30 DIAGNOSIS — IMO0002 Reserved for concepts with insufficient information to code with codable children: Secondary | ICD-10-CM

## 2012-03-30 DIAGNOSIS — E7800 Pure hypercholesterolemia, unspecified: Secondary | ICD-10-CM | POA: Diagnosis present

## 2012-03-30 DIAGNOSIS — I469 Cardiac arrest, cause unspecified: Secondary | ICD-10-CM

## 2012-03-30 DIAGNOSIS — F329 Major depressive disorder, single episode, unspecified: Secondary | ICD-10-CM | POA: Diagnosis present

## 2012-03-30 DIAGNOSIS — IMO0001 Reserved for inherently not codable concepts without codable children: Secondary | ICD-10-CM | POA: Diagnosis present

## 2012-03-30 DIAGNOSIS — R531 Weakness: Secondary | ICD-10-CM

## 2012-03-30 DIAGNOSIS — D72829 Elevated white blood cell count, unspecified: Secondary | ICD-10-CM

## 2012-03-30 DIAGNOSIS — I214 Non-ST elevation (NSTEMI) myocardial infarction: Secondary | ICD-10-CM

## 2012-03-30 DIAGNOSIS — E1165 Type 2 diabetes mellitus with hyperglycemia: Secondary | ICD-10-CM | POA: Diagnosis present

## 2012-03-30 DIAGNOSIS — N179 Acute kidney failure, unspecified: Secondary | ICD-10-CM

## 2012-03-30 DIAGNOSIS — I10 Essential (primary) hypertension: Secondary | ICD-10-CM | POA: Diagnosis present

## 2012-03-30 DIAGNOSIS — Z87891 Personal history of nicotine dependence: Secondary | ICD-10-CM

## 2012-03-30 DIAGNOSIS — N289 Disorder of kidney and ureter, unspecified: Secondary | ICD-10-CM

## 2012-03-30 DIAGNOSIS — J449 Chronic obstructive pulmonary disease, unspecified: Secondary | ICD-10-CM | POA: Diagnosis present

## 2012-03-30 DIAGNOSIS — Z91013 Allergy to seafood: Secondary | ICD-10-CM

## 2012-03-30 DIAGNOSIS — Z954 Presence of other heart-valve replacement: Secondary | ICD-10-CM

## 2012-03-30 DIAGNOSIS — R9431 Abnormal electrocardiogram [ECG] [EKG]: Secondary | ICD-10-CM

## 2012-03-30 DIAGNOSIS — M069 Rheumatoid arthritis, unspecified: Secondary | ICD-10-CM | POA: Diagnosis present

## 2012-03-30 DIAGNOSIS — I5023 Acute on chronic systolic (congestive) heart failure: Principal | ICD-10-CM | POA: Diagnosis present

## 2012-03-30 DIAGNOSIS — R0600 Dyspnea, unspecified: Secondary | ICD-10-CM

## 2012-03-30 DIAGNOSIS — Z8261 Family history of arthritis: Secondary | ICD-10-CM

## 2012-03-30 DIAGNOSIS — J4489 Other specified chronic obstructive pulmonary disease: Secondary | ICD-10-CM | POA: Diagnosis present

## 2012-03-30 DIAGNOSIS — I509 Heart failure, unspecified: Secondary | ICD-10-CM

## 2012-03-30 DIAGNOSIS — I4729 Other ventricular tachycardia: Secondary | ICD-10-CM | POA: Diagnosis not present

## 2012-03-30 DIAGNOSIS — Z7901 Long term (current) use of anticoagulants: Secondary | ICD-10-CM

## 2012-03-30 DIAGNOSIS — Z8249 Family history of ischemic heart disease and other diseases of the circulatory system: Secondary | ICD-10-CM

## 2012-03-30 DIAGNOSIS — I739 Peripheral vascular disease, unspecified: Secondary | ICD-10-CM | POA: Diagnosis present

## 2012-03-30 DIAGNOSIS — J810 Acute pulmonary edema: Secondary | ICD-10-CM | POA: Diagnosis present

## 2012-03-30 DIAGNOSIS — I252 Old myocardial infarction: Secondary | ICD-10-CM

## 2012-03-30 DIAGNOSIS — Z9861 Coronary angioplasty status: Secondary | ICD-10-CM

## 2012-03-30 HISTORY — DX: Cardiac arrest, cause unspecified: I46.9

## 2012-03-30 LAB — COMPREHENSIVE METABOLIC PANEL
ALT: 23 U/L (ref 0–35)
Albumin: 3.7 g/dL (ref 3.5–5.2)
Alkaline Phosphatase: 54 U/L (ref 39–117)
Calcium: 9.7 mg/dL (ref 8.4–10.5)
GFR calc Af Amer: 42 mL/min — ABNORMAL LOW (ref >90)
Potassium: 5.7 mEq/L — ABNORMAL HIGH (ref 3.5–5.1)
Sodium: 136 mEq/L (ref 135–145)
Total Protein: 7.2 g/dL (ref 6.0–8.3)

## 2012-03-30 LAB — PRO B NATRIURETIC PEPTIDE: Pro B Natriuretic peptide (BNP): 2717 pg/mL — ABNORMAL HIGH (ref 0–125)

## 2012-03-30 LAB — CBC
MCH: 33.4 pg (ref 26.0–34.0)
MCHC: 32.5 g/dL (ref 30.0–36.0)
RDW: 14.5 % (ref 11.5–15.5)

## 2012-03-30 LAB — TYPE AND SCREEN
ABO/RH(D): A POS
Antibody Screen: NEGATIVE

## 2012-03-30 LAB — TROPONIN I: Troponin I: 1.11 ng/mL (ref <0.30)

## 2012-03-30 SURGERY — Surgical Case
Anesthesia: *Unknown

## 2012-03-30 MED ORDER — NITROGLYCERIN IN D5W 200-5 MCG/ML-% IV SOLN
2.0000 ug/min | Freq: Once | INTRAVENOUS | Status: AC
  Administered 2012-03-30: 5 ug/min via INTRAVENOUS
  Filled 2012-03-30: qty 250

## 2012-03-30 MED ORDER — INSULIN ASPART 100 UNIT/ML ~~LOC~~ SOLN
10.0000 [IU] | Freq: Once | SUBCUTANEOUS | Status: AC
  Administered 2012-03-30: 10 [IU] via SUBCUTANEOUS
  Filled 2012-03-30: qty 1

## 2012-03-30 MED ORDER — FUROSEMIDE 10 MG/ML IJ SOLN
40.0000 mg | Freq: Once | INTRAMUSCULAR | Status: AC
  Administered 2012-03-30: 40 mg via INTRAVENOUS
  Filled 2012-03-30: qty 4

## 2012-03-30 MED ORDER — NITROGLYCERIN 2 % TD OINT
1.0000 [in_us] | TOPICAL_OINTMENT | Freq: Once | TRANSDERMAL | Status: AC
  Administered 2012-03-30: 1 [in_us] via TOPICAL
  Filled 2012-03-30: qty 1

## 2012-03-30 MED ORDER — ACETAMINOPHEN 325 MG PO TABS
650.0000 mg | ORAL_TABLET | Freq: Once | ORAL | Status: AC
  Administered 2012-03-30: 650 mg via ORAL
  Filled 2012-03-30: qty 2

## 2012-03-30 MED ORDER — NITROGLYCERIN IN D5W 200-5 MCG/ML-% IV SOLN: 2.0000 ug/min | Freq: Once | INTRAVENOUS | Status: DC

## 2012-03-30 MED ORDER — SODIUM CHLORIDE 0.9 % IV SOLN
INTRAVENOUS | Status: DC
  Administered 2012-03-30: 21:00:00 via INTRAVENOUS

## 2012-03-30 MED ORDER — HEPARIN SODIUM (PORCINE) 5000 UNIT/ML IJ SOLN
INTRAMUSCULAR | Status: AC
  Filled 2012-03-30: qty 1

## 2012-03-30 NOTE — H&P (Signed)
Chief Complaint:  Shortness of breath  Primary cardiologist: Dr. Thurmon Fair  Primary care physician: Dr. Merri Brunette  HPI:  This is a 72 y.o. 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 She had a stent placed 6 years later in 1997. She had redo bypass surgery in 2005. Problems include hypertension and diabetes. She just saw Dr. Royann Shivers  one month ago and was doing well. Her last admission was a year ago for congestive heart failure. She was at a ball game this afternoon in the cold and came home developed acute onset shortness of breath. She denied chest pain. She has a baseline abnormal EKG with  Diffuse ST segment changes. EMS was called and a code STEMI was activated. Dr. Garnette Scheuermann, from [facility] cardiology, came in to see her and counseled her code STEMI. She was placed on BiPAP and given IV Lasix with marked improvement in her symptoms.  PMHx:  Past Medical History  Diagnosis Date  . High blood pressure   . Angina at rest   . Irregular heart rhythm   . Diabetes mellitus   . High cholesterol   . Coronary artery disease   . COPD (chronic obstructive pulmonary disease)   . Anemia   . Irregular heart beat   . CHF (congestive heart failure)   . Myocardial infarction march 2013  . Peripheral vascular disease     Past Surgical History  Procedure Laterality Date  . Gallbadder  2003  . Cardiac valve replacement  1991  . Heart bypass  1991/2005  . Angioplasty    . Coronary artery bypass graft    . Aortic valve replacement    . Appendectomy    . Abdominal hysterectomy    . Cholecystectomy    . Esophagogastroduodenoscopy  03/31/2011    Procedure: ESOPHAGOGASTRODUODENOSCOPY (EGD);  Surgeon: Beverley Fiedler, MD;  Location: [facility] ENDOSCOPY;  Service: Gastroenterology;  Laterality: N/A;  . Bypass graft  1991 and 2005    FAMHx:  Family History  Problem Relation Age of Onset  .  Rheum arthritis Mother   . Cervical cancer Mother   . Stomach cancer Mother   . Cancer Mother   . Hyperlipidemia Mother   . Hypertension Mother   . Rheum arthritis Father   . Rheum arthritis Paternal Aunt   . Anesthesia problems Neg Hx   . Deep vein thrombosis Brother   . Hypertension Brother   . Hypertension Daughter     SOCHx:   reports that she quit smoking about 39 years ago. Her smoking use included Cigarettes. She has a 7.5 pack-year smoking history. She has never used smokeless tobacco. She reports that she does not drink alcohol or use illicit drugs.  ALLERGIES:  Allergies  Allergen Reactions  . Shellfish Allergy Anaphylaxis  . Codeine     Feels sick    ROS: Constitutional: negative Eyes: negative Ears, nose, mouth, throat, and face: negative Respiratory: positive for dyspnea on exertion Cardiovascular: negative Gastrointestinal: negative Genitourinary:negative Integument/breast: negative  HOME MEDS:  (Not in a hospital admission)  LABS/IMAGING: Results for orders placed during the hospital encounter of 03/30/12 (from the past 48 hour(s))  PRO B NATRIURETIC PEPTIDE     Status: Abnormal   Collection Time    03/30/12  9:16 PM      Result Value Range   Pro B Natriuretic peptide (BNP) 2717.0 (*) 0 - 125 pg/mL  CBC  Status: Abnormal   Collection Time    03/30/12  9:16 PM      Result Value Range   WBC 14.9 (*) 4.0 - 10.5 K/uL   RBC 3.71 (*) 3.87 - 5.11 MIL/uL   Hemoglobin 12.4  12.0 - 15.0 g/dL   HCT 16.1  09.6 - 04.5 %   MCV 103.0 (*) 78.0 - 100.0 fL   MCH 33.4  26.0 - 34.0 pg   MCHC 32.5  30.0 - 36.0 g/dL   RDW 40.9  81.1 - 91.4 %   Platelets 470 (*) 150 - 400 K/uL  COMPREHENSIVE METABOLIC PANEL     Status: Abnormal   Collection Time    03/30/12  9:16 PM      Result Value Range   Sodium 136  135 - 145 mEq/L   Potassium 5.7 (*) 3.5 - 5.1 mEq/L   Chloride 95 (*) 96 - 112 mEq/L   CO2 25  19 - 32 mEq/L   Glucose, Bld 443 (*) 70 - 99 mg/dL   BUN  37 (*) 6 - 23 mg/dL   Creatinine, Ser 7.82 (*) 0.50 - 1.10 mg/dL   Calcium 9.7  8.4 - 95.6 mg/dL   Total Protein 7.2  6.0 - 8.3 g/dL   Albumin 3.7  3.5 - 5.2 g/dL   AST 45 (*) 0 - 37 U/L   ALT 23  0 - 35 U/L   Alkaline Phosphatase 54  39 - 117 U/L   Total Bilirubin 0.6  0.3 - 1.2 mg/dL   GFR calc non Af Amer 36 (*) >90 mL/min   GFR calc Af Amer 42 (*) >90 mL/min   Comment:            The eGFR has been calculated     using the CKD EPI equation.     This calculation has not been     validated in all clinical     situations.     eGFR's persistently     <90 mL/min signify     possible Chronic Kidney Disease.  TROPONIN I     Status: Abnormal   Collection Time    03/30/12  9:16 PM      Result Value Range   Troponin I 1.11 (*) <0.30 ng/mL   Comment:            Due to the release kinetics of cTnI,     a negative result within the first hours     of the onset of symptoms does not rule out     myocardial infarction with certainty.     If myocardial infarction is still suspected,     repeat the test at appropriate intervals.     CRITICAL VALUE NOTED.  VALUE IS CONSISTENT WITH PREVIOUSLY REPORTED AND CALLED VALUE.  PROTIME-INR     Status: Abnormal   Collection Time    03/30/12  9:16 PM      Result Value Range   Prothrombin Time 26.3 (*) 11.6 - 15.2 seconds   INR 2.56 (*) 0.00 - 1.49  TYPE AND SCREEN     Status: None   Collection Time    03/30/12  9:25 PM      Result Value Range   ABO/RH(D) A POS     Antibody Screen NEG     Sample Expiration 04/02/2012     Dg Chest Port 1 View  03/30/2012  *RADIOLOGY REPORT*  Clinical Data: Respiratory distress, shortness of breath, question CHF, history hypertension,  diabetes, arrhythmia, coronary artery disease, COPD, MI  PORTABLE CHEST - 1 VIEW  Comparison: Portable exam 2131 hours compared to 03/31/2011 at 0505 hours  Findings: Enlargement of cardiac silhouette post CABG. Calcified tortuous aorta. Pulmonary vascular congestion. Interstitial  infiltrates bilaterally new since previous exam likely representing pulmonary edema and CHF. No pleural effusion or pneumothorax. Bones diffusely demineralized.  IMPRESSION: CHF.   Original Report Authenticated By: Ulyses Southward, M.D.     VITALS: Blood pressure 166/74, pulse 109, temperature 98.3 F (36.8 C), temperature source Oral, resp. rate 21, height 5\' 3"  (1.6 m), weight 86.183 kg (190 lb), SpO2 100.00%.  EXAM: General appearance: alert, cooperative and mild distress Neck: no adenopathy, no carotid bruit, no JVD, supple, symmetrical, trachea midline and thyroid not enlarged, symmetric, no tenderness/mass/nodules Lungs: rales LLL Heart: Crisp valve sounds Abdomen: soft, non-tender; bowel sounds normal; no masses,  no organomegaly Extremities: extremities normal, atraumatic, no cyanosis or edema  IMPRESSION: This Padgett has acute congestive heart failure. She's had this in the past and has known ischemic heart disease. Her BNP is approximately  2000 and her troponin is mildly elevated at 1.1. Her serum glucose is also elevated at greater than 500 and her serum creatinine has increased from 1.26 yesterday to 1.5 today. She improved clinically with BiPAP and IV diuretics. While this could be ischemically mediated her baseline EKG is markedly abnormal and her EKG today shows similar findings except for an elevated heart rate. She is mildly hypertensive. Her chest x-ray is consistent with congestive heart failure.  PLAN: She Will  be admitted to the intensive care unit. I have asked the [facility]  to assist in her care especially with her diabetes. I've asked for a Foley catheter be replaced and have switched her from BiPAP to a nonrebreather. We will continue IV diuretics. At this point I'm going to also continue her Coumadin until it is decided whether or not she needs an  invasive procedure.    Runell Gess 03/30/2012, 10:51 PM

## 2012-03-30 NOTE — ED Notes (Signed)
Switched patient to nonrebreather per the request of Dr. Allyson Sabal and the cardiologist.

## 2012-03-30 NOTE — ED Notes (Signed)
The patient advised EMS that she started feeling short of breath at around 1900 this evening, so she took nitro-stat 0.4 mg, sL x1 at home.  The patient denied having any chest pain at any time.  On arrival, EMS gave nitro-stat 0.4 mg, sL x 3 and ASA 324 mg, po.  The patient has a history of CABG (9 years ago) (unknown number of arteries) and stents (n.o.s.).  She reports having 0/10 pain the entire time.

## 2012-03-30 NOTE — ED Notes (Signed)
Per the patient's daughter, the patient was started on Keflex yesterday.  She does not know the dosage, however she will get the pills from home and advise [place].

## 2012-03-30 NOTE — ED Provider Notes (Signed)
History     CSN: [id number]  Arrival date & time 03/30/12  2105  Chief Complaint  Patient presents with  . Respiratory Distress    STEMI called off by cardiologist   HPI  72 y/o with history of CAD, CHF, COPD, DM who presents with cc of shortness of breath. The patient states that she began to feel short of breath this evening at approximately 1900. She states that prior to that she was in her usual state of health. The patient took a SL nitro prior to calling EMS. En route, EMS placed the patient on BiPAP, gave SL nitro X 3, and 325 mg of ASA. She denies chest pain.   Past Medical History  Diagnosis Date  . High blood pressure   . Angina at rest   . Irregular heart rhythm   . Diabetes mellitus   . High cholesterol   . Coronary artery disease   . COPD (chronic obstructive pulmonary disease)   . Anemia   . Irregular heart beat   . CHF (congestive heart failure)   . Myocardial infarction march 2013  . Peripheral vascular disease     Past Surgical History  Procedure Laterality Date  . Gallbadder  2003  . Cardiac valve replacement  1991  . Heart bypass  1991/2005  . Angioplasty    . Coronary artery bypass graft    . Aortic valve replacement    . Appendectomy    . Abdominal hysterectomy    . Cholecystectomy    . Esophagogastroduodenoscopy  03/31/2011    Procedure: ESOPHAGOGASTRODUODENOSCOPY (EGD);  Surgeon: Beverley Fiedler, MD;  Location: [facility] ENDOSCOPY;  Service: Gastroenterology;  Laterality: N/A;  . Bypass graft  1991 and 2005    Family History  Problem Relation Age of Onset  . Rheum arthritis Mother   . Cervical cancer Mother   . Stomach cancer Mother   . Cancer Mother   . Hyperlipidemia Mother   . Hypertension Mother   . Rheum arthritis Father   . Rheum arthritis Paternal Aunt   . Anesthesia problems Neg Hx   . Deep vein thrombosis Brother   . Hypertension Brother   . Hypertension Daughter     History  Substance Use Topics  . Smoking status: Former Smoker --  0.50 packs/day for 15 years    Types: Cigarettes    Quit date: 01/18/1973  . Smokeless tobacco: Never Used  . Alcohol Use: No    OB History   Grav Para Term Preterm Abortions TAB SAB Ect Mult Living                  Review of Systems  Constitutional: Negative for fever and chills.  HENT: Negative for congestion and rhinorrhea.   Respiratory: Positive for shortness of breath. Negative for cough.   Cardiovascular: Negative for chest pain.  Gastrointestinal: Negative for nausea and vomiting.  Genitourinary: Negative for dysuria and frequency.  All other systems reviewed and are negative.    Allergies  Shellfish allergy and Codeine  Home Medications   Current Outpatient Rx  Name  Route  Sig  Dispense  Refill  . aspirin 81 MG tablet   Oral   Take 81 mg by mouth daily.           Marland Kitchen atorvastatin (LIPITOR) 40 MG tablet   Oral   Take 40 mg by mouth daily.           . calcium carbonate (OS-CAL) 600 MG TABS  Oral   Take 600 mg by mouth 2 (two) times daily.           . DULoxetine (CYMBALTA) 60 MG capsule   Oral   Take 60 mg by mouth daily.           . fenofibrate 54 MG tablet   Oral   Take 54 mg by mouth daily.           . furosemide (LASIX) 40 MG tablet   Oral   Take 40 mg by mouth 2 (two) times daily.           Marland Kitchen glimepiride (AMARYL) 1 MG tablet   Oral   Take 1 mg by mouth daily.           Marland Kitchen HYDROcodone-acetaminophen (NORCO) 7.5-325 MG per tablet   Oral   Take 1 tablet by mouth at bedtime as needed. For pain         . isosorbide mononitrate (IMDUR) 30 MG 24 hr tablet   Oral   Take 1 tablet (30 mg total) by mouth daily.   30 tablet   0   . LOSARTAN POTASSIUM PO   Oral   Take 100 mg by mouth daily.           . metoprolol succinate (TOPROL-XL) 25 MG 24 hr tablet   Oral   Take 50 mg by mouth daily. Take with or immediately following a meal.         . Multiple Vitamin (MULTIVITAMIN) capsule   Oral   Take 1 capsule by mouth daily.          . mupirocin ointment (BACTROBAN) 2 %   Topical   Apply topically as needed.         . Omeprazole 20 MG TBEC   Oral   Take 20 mg by mouth at bedtime.         . pantoprazole (PROTONIX) 40 MG tablet   Oral   Take 1 tablet (40 mg total) by mouth 2 (two) times daily before a meal.   60 tablet   3     after 12 weeks please decrease dosing to once dail ...   . POTASSIUM CHLORIDE PO   Oral   Take 20 mEq by mouth. Monday, Wednesday, Friday          . PRESCRIPTION MEDICATION   Subcutaneous   Inject 54 Units into the skin daily.         . temazepam (RESTORIL) 30 MG capsule   Oral   Take 30 mg by mouth at bedtime as needed.         . vitamin B-12 (CYANOCOBALAMIN) 1000 MCG tablet   Oral   Take 1,000 mcg by mouth 2 (two) times daily.           Marland Kitchen warfarin (COUMADIN) 2.5 MG tablet      Take 2.5 mg on Sun, Tues, Thurs & Sat Take 5 mg on Mon, Wed & Fri.           BP 147/54  Pulse 102  Temp(Src) 98.3 F (36.8 C) (Oral)  Resp 17  Ht 5\' 3"  (1.6 m)  Wt 190 lb (86.183 kg)  BMI 33.67 kg/m2  SpO2 100%  Physical Exam  Nursing note and vitals reviewed. Constitutional: She is oriented to person, place, and time. She appears well-developed and well-nourished. No distress.  HENT:  Head: Normocephalic and atraumatic.  Mouth/Throat: No oropharyngeal exudate.  Eyes: Conjunctivae are normal. Pupils  are equal, round, and reactive to light.  Neck: Normal range of motion. Neck supple.  Cardiovascular: Normal rate and regular rhythm.  Exam reveals no gallop and no friction rub.   No murmur heard. Pulmonary/Chest: Tachypnea noted. She has rales in the right lower field and the left lower field.  Abdominal: Soft. Bowel sounds are normal.  Musculoskeletal: Normal range of motion. She exhibits no edema and no tenderness.  Neurological: She is alert and oriented to person, place, and time. She has normal strength and normal reflexes. No cranial nerve deficit or sensory  deficit. Coordination normal. GCS eye subscore is 4. GCS verbal subscore is 5. GCS motor subscore is 6.  Skin: Skin is warm and dry.  Psychiatric: She has a normal mood and affect.    ED Course  Procedures (including critical care time)  Labs Reviewed  PRO B NATRIURETIC PEPTIDE - Abnormal; Notable for the following:    Pro B Natriuretic peptide (BNP) 2717.0 (*)    All other components within normal limits  CBC - Abnormal; Notable for the following:    WBC 14.9 (*)    RBC 3.71 (*)    MCV 103.0 (*)    Platelets 470 (*)    All other components within normal limits  COMPREHENSIVE METABOLIC PANEL - Abnormal; Notable for the following:    Potassium 5.7 (*)    Chloride 95 (*)    Glucose, Bld 443 (*)    BUN 37 (*)    Creatinine, Ser 1.42 (*)    AST 45 (*)    GFR calc non Af Amer 36 (*)    GFR calc Af Amer 42 (*)    All other components within normal limits  TROPONIN I - Abnormal; Notable for the following:    Troponin I 1.11 (*)    All other components within normal limits  PROTIME-INR - Abnormal; Notable for the following:    Prothrombin Time 26.3 (*)    INR 2.56 (*)    All other components within normal limits  URINALYSIS, ROUTINE W REFLEX MICROSCOPIC  TYPE AND SCREEN   Dg Chest Port 1 View  03/30/2012  *RADIOLOGY REPORT*  Clinical Data: Respiratory distress, shortness of breath, question CHF, history hypertension, diabetes, arrhythmia, coronary artery disease, COPD, MI  PORTABLE CHEST - 1 VIEW  Comparison: Portable exam 2131 hours compared to 03/31/2011 at 0505 hours  Findings: Enlargement of cardiac silhouette post CABG. Calcified tortuous aorta. Pulmonary vascular congestion. Interstitial infiltrates bilaterally new since previous exam likely representing pulmonary edema and CHF. No pleural effusion or pneumothorax. Bones diffusely demineralized.  IMPRESSION: CHF.   Original Report Authenticated By: Ulyses Southward, M.D.    1. CHF (congestive heart failure)   2. Elevated troponin    3. Abnormal ECG   4. Renal insufficiency   5. Dyspnea   6. Weakness     MDM   72 y/o with history of CAD, CHF, COPD, DM who presents with cc of shortness of breath. Initial EKG en route concerning for STEMI. However, the patient's EKG is relatively unchanged to prior so the STEMI was cancelled after discussing case with cardiologist. CXR c/w CHF. BNP elevated to 2717. Labs c/w acute CHF. Troponin elevated to 1.11. INR therapeutic. The patient was given lasix and started on a nitro gtt and was admitted to the CCU.          Shanon Ace, MD 03/31/12 0002

## 2012-03-30 NOTE — ED Notes (Signed)
Cardiologist is aware of ST depression.

## 2012-03-31 ENCOUNTER — Encounter (HOSPITAL_COMMUNITY): Payer: Self-pay | Admitting: *Deleted

## 2012-03-31 DIAGNOSIS — E875 Hyperkalemia: Secondary | ICD-10-CM | POA: Diagnosis present

## 2012-03-31 DIAGNOSIS — J810 Acute pulmonary edema: Secondary | ICD-10-CM | POA: Diagnosis present

## 2012-03-31 DIAGNOSIS — N179 Acute kidney failure, unspecified: Secondary | ICD-10-CM | POA: Diagnosis present

## 2012-03-31 DIAGNOSIS — IMO0002 Reserved for concepts with insufficient information to code with codable children: Secondary | ICD-10-CM | POA: Diagnosis present

## 2012-03-31 DIAGNOSIS — E1165 Type 2 diabetes mellitus with hyperglycemia: Secondary | ICD-10-CM

## 2012-03-31 LAB — URINALYSIS, ROUTINE W REFLEX MICROSCOPIC
Bilirubin Urine: NEGATIVE
Glucose, UA: 250 mg/dL — AB
Hgb urine dipstick: NEGATIVE
Ketones, ur: NEGATIVE mg/dL
Nitrite: NEGATIVE
Protein, ur: NEGATIVE mg/dL
Specific Gravity, Urine: 1.014 (ref 1.005–1.030)
Urobilinogen, UA: 0.2 mg/dL (ref 0.0–1.0)
pH: 5 (ref 5.0–8.0)

## 2012-03-31 LAB — COMPREHENSIVE METABOLIC PANEL
ALT: 22 U/L (ref 0–35)
AST: 54 U/L — ABNORMAL HIGH (ref 0–37)
Albumin: 3.4 g/dL — ABNORMAL LOW (ref 3.5–5.2)
Alkaline Phosphatase: 48 U/L (ref 39–117)
BUN: 40 mg/dL — ABNORMAL HIGH (ref 6–23)
CO2: 29 mEq/L (ref 19–32)
Calcium: 9.3 mg/dL (ref 8.4–10.5)
Chloride: 99 mEq/L (ref 96–112)
Creatinine, Ser: 1.26 mg/dL — ABNORMAL HIGH (ref 0.50–1.10)
GFR calc Af Amer: 48 mL/min — ABNORMAL LOW (ref >90)
GFR calc non Af Amer: 42 mL/min — ABNORMAL LOW (ref >90)
Glucose, Bld: 158 mg/dL — ABNORMAL HIGH (ref 70–99)
Potassium: 4.1 mEq/L (ref 3.5–5.1)
Sodium: 140 mEq/L (ref 135–145)
Total Bilirubin: 0.5 mg/dL (ref 0.3–1.2)
Total Protein: 6.5 g/dL (ref 6.0–8.3)

## 2012-03-31 LAB — GLUCOSE, CAPILLARY
Glucose-Capillary: 316 mg/dL — ABNORMAL HIGH (ref 70–99)
Glucose-Capillary: 242 mg/dL — ABNORMAL HIGH (ref 70–99)
Glucose-Capillary: 184 mg/dL — ABNORMAL HIGH (ref 70–99)
Glucose-Capillary: 159 mg/dL — ABNORMAL HIGH (ref 70–99)
Glucose-Capillary: 169 mg/dL — ABNORMAL HIGH (ref 70–99)

## 2012-03-31 LAB — URINE MICROSCOPIC-ADD ON

## 2012-03-31 LAB — PROTIME-INR
INR: 2.44 — ABNORMAL HIGH (ref 0.00–1.49)
Prothrombin Time: 25.4 seconds — ABNORMAL HIGH (ref 11.6–15.2)

## 2012-03-31 LAB — HEMOGLOBIN A1C: Hgb A1c MFr Bld: 9.5 % — ABNORMAL HIGH (ref <5.7)

## 2012-03-31 LAB — HEPARIN LEVEL (UNFRACTIONATED): Heparin Unfractionated: 0.11 IU/mL — ABNORMAL LOW (ref 0.30–0.70)

## 2012-03-31 LAB — TROPONIN I: Troponin I: 7.82 ng/mL (ref <0.30)

## 2012-03-31 LAB — MRSA PCR SCREENING: MRSA by PCR: NEGATIVE

## 2012-03-31 MED ORDER — FENOFIBRATE 54 MG PO TABS
54.0000 mg | ORAL_TABLET | Freq: Every day | ORAL | Status: DC
  Administered 2012-03-31 – 2012-04-07 (×8): 54 mg via ORAL
  Filled 2012-03-31 (×8): qty 1

## 2012-03-31 MED ORDER — LOSARTAN POTASSIUM 50 MG PO TABS
100.0000 mg | ORAL_TABLET | Freq: Every day | ORAL | Status: DC
  Administered 2012-03-31 – 2012-04-02 (×2): 100 mg via ORAL
  Filled 2012-03-31 (×4): qty 2

## 2012-03-31 MED ORDER — SODIUM CHLORIDE 0.9 % IJ SOLN: 3.0000 mL | INTRAMUSCULAR | Status: DC | PRN

## 2012-03-31 MED ORDER — PREDNISONE 10 MG PO TABS
10.0000 mg | ORAL_TABLET | Freq: Every day | ORAL | Status: DC
  Administered 2012-03-31: 10 mg via ORAL
  Filled 2012-03-31 (×3): qty 1

## 2012-03-31 MED ORDER — PANTOPRAZOLE SODIUM 40 MG PO TBEC
40.0000 mg | DELAYED_RELEASE_TABLET | Freq: Every day | ORAL | Status: DC
  Administered 2012-03-31 – 2012-04-06 (×7): 40 mg via ORAL
  Filled 2012-03-31 (×7): qty 1

## 2012-03-31 MED ORDER — SODIUM CHLORIDE 0.9 % IJ SOLN
3.0000 mL | Freq: Two times a day (BID) | INTRAMUSCULAR | Status: DC
  Administered 2012-03-31: 3 mL via INTRAVENOUS

## 2012-03-31 MED ORDER — SODIUM CHLORIDE 0.45 % IV SOLN: INTRAVENOUS | Status: DC

## 2012-03-31 MED ORDER — GABAPENTIN 300 MG PO CAPS
300.0000 mg | ORAL_CAPSULE | Freq: Every day | ORAL | Status: DC | PRN
  Filled 2012-03-31: qty 1

## 2012-03-31 MED ORDER — INSULIN GLARGINE 100 UNIT/ML ~~LOC~~ SOLN: 63.0000 [IU] | Freq: Every day | SUBCUTANEOUS | Status: DC

## 2012-03-31 MED ORDER — MIRTAZAPINE 15 MG PO TBDP
15.0000 mg | ORAL_TABLET | Freq: Every day | ORAL | Status: DC
  Administered 2012-03-31: 15 mg via ORAL
  Filled 2012-03-31: qty 1

## 2012-03-31 MED ORDER — METOPROLOL SUCCINATE ER 50 MG PO TB24
50.0000 mg | ORAL_TABLET | Freq: Every day | ORAL | Status: DC
  Administered 2012-03-31: 50 mg via ORAL
  Filled 2012-03-31 (×2): qty 1

## 2012-03-31 MED ORDER — ATORVASTATIN CALCIUM 40 MG PO TABS
40.0000 mg | ORAL_TABLET | Freq: Every day | ORAL | Status: DC
  Administered 2012-03-31: 40 mg via ORAL
  Filled 2012-03-31: qty 1

## 2012-03-31 MED ORDER — INSULIN GLARGINE 100 UNIT/ML ~~LOC~~ SOLN: 25.0000 [IU] | Freq: Every day | SUBCUTANEOUS | Status: DC

## 2012-03-31 MED ORDER — MUPIROCIN 2 % EX OINT
TOPICAL_OINTMENT | Freq: Two times a day (BID) | CUTANEOUS | Status: DC
  Administered 2012-03-31 – 2012-04-07 (×14): via TOPICAL
  Filled 2012-03-31 (×3): qty 22

## 2012-03-31 MED ORDER — INSULIN ASPART 100 UNIT/ML ~~LOC~~ SOLN
0.0000 [IU] | Freq: Every day | SUBCUTANEOUS | Status: DC
  Administered 2012-03-31: 2 [IU] via SUBCUTANEOUS
  Administered 2012-04-01: 3 [IU] via SUBCUTANEOUS
  Administered 2012-04-04: 2 [IU] via SUBCUTANEOUS

## 2012-03-31 MED ORDER — INSULIN REGULAR BOLUS VIA INFUSION
0.0000 [IU] | Freq: Three times a day (TID) | INTRAVENOUS | Status: DC
  Filled 2012-03-31: qty 10

## 2012-03-31 MED ORDER — ASPIRIN 81 MG PO CHEW
81.0000 mg | CHEWABLE_TABLET | Freq: Every day | ORAL | Status: DC
  Filled 2012-03-31: qty 1

## 2012-03-31 MED ORDER — HYDROCODONE-ACETAMINOPHEN 7.5-325 MG PO TABS
1.0000 | ORAL_TABLET | Freq: Four times a day (QID) | ORAL | Status: DC | PRN
  Administered 2012-03-31 – 2012-04-02 (×4): 1 via ORAL
  Filled 2012-03-31 (×5): qty 1

## 2012-03-31 MED ORDER — NITROGLYCERIN IN D5W 200-5 MCG/ML-% IV SOLN: 5.0000 ug/min | INTRAVENOUS | Status: DC

## 2012-03-31 MED ORDER — SODIUM CHLORIDE 0.9 % IV SOLN
INTRAVENOUS | Status: DC
  Administered 2012-03-31: 10:00:00 via INTRAVENOUS

## 2012-03-31 MED ORDER — DULOXETINE HCL 60 MG PO CPEP
60.0000 mg | ORAL_CAPSULE | Freq: Every day | ORAL | Status: DC
  Administered 2012-03-31 – 2012-04-01 (×2): 60 mg via ORAL
  Filled 2012-03-31 (×2): qty 1

## 2012-03-31 MED ORDER — GLIMEPIRIDE 1 MG PO TABS
1.0000 mg | ORAL_TABLET | Freq: Every day | ORAL | Status: DC
  Administered 2012-03-31: 1 mg via ORAL
  Filled 2012-03-31 (×2): qty 1

## 2012-03-31 MED ORDER — INSULIN ASPART 100 UNIT/ML ~~LOC~~ SOLN
0.0000 [IU] | Freq: Three times a day (TID) | SUBCUTANEOUS | Status: DC
  Administered 2012-03-31: 11 [IU] via SUBCUTANEOUS
  Administered 2012-03-31: 3 [IU] via SUBCUTANEOUS
  Administered 2012-04-01: 2 [IU] via SUBCUTANEOUS
  Administered 2012-04-01 – 2012-04-02 (×2): 8 [IU] via SUBCUTANEOUS
  Administered 2012-04-02: 3 [IU] via SUBCUTANEOUS
  Administered 2012-04-02: 8 [IU] via SUBCUTANEOUS
  Administered 2012-04-03: 15 [IU] via SUBCUTANEOUS
  Administered 2012-04-03: 2 [IU] via SUBCUTANEOUS
  Administered 2012-04-04: 3 [IU] via SUBCUTANEOUS
  Administered 2012-04-04: 15 [IU] via SUBCUTANEOUS
  Administered 2012-04-05 (×2): 3 [IU] via SUBCUTANEOUS
  Administered 2012-04-06: 2 [IU] via SUBCUTANEOUS

## 2012-03-31 MED ORDER — NITROGLYCERIN 0.4 MG SL SUBL: 0.4000 mg | SUBLINGUAL_TABLET | SUBLINGUAL | Status: DC | PRN

## 2012-03-31 MED ORDER — ADULT MULTIVITAMIN W/MINERALS CH
1.0000 | ORAL_TABLET | Freq: Every day | ORAL | Status: DC
  Administered 2012-03-31 – 2012-04-07 (×8): 1 via ORAL
  Filled 2012-03-31 (×8): qty 1

## 2012-03-31 MED ORDER — VITAMIN B-12 1000 MCG PO TABS
1000.0000 ug | ORAL_TABLET | Freq: Two times a day (BID) | ORAL | Status: DC
  Administered 2012-03-31 – 2012-04-07 (×14): 1000 ug via ORAL
  Filled 2012-03-31 (×16): qty 1

## 2012-03-31 MED ORDER — CALCIUM CARBONATE 1250 (500 CA) MG PO TABS
1.0000 | ORAL_TABLET | Freq: Two times a day (BID) | ORAL | Status: DC
  Administered 2012-03-31 – 2012-04-07 (×14): 500 mg via ORAL
  Filled 2012-03-31 (×19): qty 1

## 2012-03-31 MED ORDER — DEXTROSE-NACL 5-0.45 % IV SOLN
INTRAVENOUS | Status: DC
  Administered 2012-03-31: 03:00:00 via INTRAVENOUS

## 2012-03-31 MED ORDER — GABAPENTIN 300 MG PO CAPS
300.0000 mg | ORAL_CAPSULE | Freq: Two times a day (BID) | ORAL | Status: DC
  Administered 2012-03-31 – 2012-04-07 (×14): 300 mg via ORAL
  Filled 2012-03-31 (×16): qty 1

## 2012-03-31 MED ORDER — DEXTROSE 50 % IV SOLN: 25.0000 mL | INTRAVENOUS | Status: DC | PRN

## 2012-03-31 MED ORDER — FUROSEMIDE 10 MG/ML IJ SOLN
40.0000 mg | Freq: Two times a day (BID) | INTRAMUSCULAR | Status: DC
  Administered 2012-03-31 – 2012-04-01 (×3): 40 mg via INTRAVENOUS
  Filled 2012-03-31 (×6): qty 4

## 2012-03-31 MED ORDER — SODIUM CHLORIDE 0.9 % IV SOLN
INTRAVENOUS | Status: DC
  Administered 2012-03-31: 2.6 [IU]/h via INTRAVENOUS
  Filled 2012-03-31: qty 1

## 2012-03-31 MED ORDER — ACETAMINOPHEN 325 MG PO TABS: 650.0000 mg | ORAL_TABLET | ORAL | Status: DC | PRN

## 2012-03-31 MED ORDER — WARFARIN - PHARMACIST DOSING INPATIENT: Freq: Every day | Status: DC

## 2012-03-31 MED ORDER — INSULIN GLARGINE 100 UNIT/ML ~~LOC~~ SOLN
53.0000 [IU] | Freq: Once | SUBCUTANEOUS | Status: AC
  Administered 2012-03-31: 53 [IU] via SUBCUTANEOUS

## 2012-03-31 MED ORDER — HEPARIN (PORCINE) IN NACL 100-0.45 UNIT/ML-% IJ SOLN
14.0000 [IU]/kg/h | INTRAMUSCULAR | Status: DC
  Administered 2012-03-31: 14 [IU]/kg/h via INTRAVENOUS
  Filled 2012-03-31: qty 250

## 2012-03-31 MED ORDER — ASPIRIN 81 MG PO CHEW
81.0000 mg | CHEWABLE_TABLET | Freq: Every day | ORAL | Status: DC
  Administered 2012-03-31 – 2012-04-06 (×6): 81 mg via ORAL
  Filled 2012-03-31 (×6): qty 1

## 2012-03-31 MED ORDER — ONDANSETRON HCL 4 MG/2ML IJ SOLN
4.0000 mg | Freq: Four times a day (QID) | INTRAMUSCULAR | Status: DC | PRN
  Administered 2012-04-01: 4 mg via INTRAVENOUS
  Filled 2012-03-31: qty 2

## 2012-03-31 MED ORDER — METOPROLOL SUCCINATE ER 50 MG PO TB24
50.0000 mg | ORAL_TABLET | Freq: Two times a day (BID) | ORAL | Status: DC
  Administered 2012-03-31 – 2012-04-05 (×8): 50 mg via ORAL
  Filled 2012-03-31 (×11): qty 1

## 2012-03-31 MED ORDER — SODIUM CHLORIDE 0.9 % IV SOLN: 250.0000 mL | INTRAVENOUS | Status: DC | PRN

## 2012-03-31 MED ORDER — SODIUM CHLORIDE 0.9 % IV SOLN
INTRAVENOUS | Status: AC
  Filled 2012-03-31: qty 1

## 2012-03-31 MED ORDER — ALPRAZOLAM 0.25 MG PO TABS: 0.2500 mg | ORAL_TABLET | Freq: Two times a day (BID) | ORAL | Status: DC | PRN

## 2012-03-31 MED ORDER — TEMAZEPAM 15 MG PO CAPS
30.0000 mg | ORAL_CAPSULE | Freq: Every evening | ORAL | Status: DC | PRN
  Administered 2012-04-01 – 2012-04-07 (×4): 30 mg via ORAL
  Filled 2012-03-31 (×4): qty 2

## 2012-03-31 MED ORDER — INSULIN GLARGINE 100 UNIT/ML ~~LOC~~ SOLN
15.0000 [IU] | Freq: Every day | SUBCUTANEOUS | Status: DC
  Administered 2012-03-31: 15 [IU] via SUBCUTANEOUS

## 2012-03-31 NOTE — Care Management Note (Signed)
    Page 1 of 2   04/07/2012     2:27:10 PM   CARE MANAGEMENT NOTE 04/07/2012  Patient:  [name], [name]   Account Number:  [id number]  Date Initiated:  03/31/2012  Documentation initiated by:  Junius Creamer  Subjective/Objective Assessment:   adm w mi     Action/Plan:   lives w husband, pcp dr Film/video editor pharr   Anticipated DC Date:  04/09/2012   Anticipated DC Plan:  HOME W HOME HEALTH SERVICES      DC Planning Services  CM consult      Choice offered to / List presented to:     DME arranged  Levan Hurst      DME agency  [facility]        Status of service:  Completed, signed off Medicare Important Message given?   (If response is "NO", the following Medicare IM given date fields will be blank) Date Medicare IM given:   Date Additional Medicare IM given:    Discharge Disposition:  HOME/SELF CARE  Per UR Regulation:  Reviewed for med. necessity/level of care/duration of stay  If discussed at Long Length of Stay Meetings, dates discussed:    Comments:  04/07/12 Rosalita Chessman [phone] PT FOR DC HOME TODAY.  REQUESTS ROLLATOR FOR HOME.  [facility] OUT OF ROLLATORS IN HOUSE.  PT TO GO BY [facility] STORE ON [address] TO PICK UP ROLLATOR.  GIVEN COPY OF ORDER FOR DME.  04/06/12 Terrius Gentile,RN,BSN [phone] PT S/P NSTEMI, CARDIAC ARREST WITH CPR ON 04/01/12.  PTA, PT RESIDED AT HOME WITH SPOUSE.  SUPPORTIVE DAUGHTER IS POA. PHYSICAL THERAPY CONSULT REQUESTED TODAY TO ASSESS FOR HOME NEEDS. WILL FOLLOW.   3/14 0930 debbie dowell rn,bsn

## 2012-03-31 NOTE — Progress Notes (Addendum)
[facility] Consultation Note [place] TEAM 1 - Stepdown/ICU TEAM   [name] WUJ:[id number] DOB: [birth year] DOA: 03/30/2012 PCP: Londell Moh, MD  Brief narrative: 72 year old female patient with history of DM 2/IDDM, who is on a Lantus study for the last 1 year, HTN, HL intolerant of statins, CAD status post CABG and PCI, chronic systolic CHF presented to ED on 03/30/12 with acute onset of dyspnea. She claims compliance with medications but not sure of dietary compliance. At 8:30 PM last night, she started having worsening dyspnea but without any chest pain or palpitations. She presented to the ED where code STEMI was called which was canceled by cardiology. She was found to be in decompensated CHF. IV Lasix was started with good effect.   Since admission the patient indicated that she was feeling much better with significant improvement in her dyspnea. Her lab work showed potassium 5.7, creatinine 1.4 and blood glucose of 443 mg/dL. Patient claimed compliance to her Lantus with home CBGs range between 125-140 mg/dL. She endorsed that blood sugars less than 90 mg/dL cause symptomatic hypoglycemia. The hospitalist service was requested to assist in her diabetes management.   Assessment/Plan: Active Problems:   Acute pulmonary edema due to decompensated CHF/Cardiomyopathy, ischemic, EF 35-40% 2D Nov 2011 -per Cards    NSTEMI, secondary to demand ischemia, Troponin >7 /history of CAD, CABG 1991, redo 2006, last cath Dec 2011, medical Rx -per Cards    Type 2 diabetes mellitus, uncontrolled -now off Insulin gtt -she explains has recently been enrolled in a Lantus study thru her PCP ofc- sts dose was 63 units daily and last HgbA1c one week ago was elevated at 11.1 -Lantus dose adjusted and SSI initiated -I advised that she should go home on NovoLog in addition to Lantus    Acute renal insufficiency, SCr 1.4 on admission/ Hyperkalemia -suspect 2/2 aggressive diuresis- per  Cards -K+ now normal    HTN (hypertension) -controlled    Leukocytosis -stress/low volume likely etiology: UA negative, CXR c/w CHF only-also on chronic steroids    H/O mechanical aortic valve replacement, St Jude 1991/   Chronic anticoagulation with Coumadin  -per cards/pharmacy    Dyslipidemia   DVT prophylaxis: Low-dose IV heparin for anticoagulation Code Status: Full Family Communication: Patient Disposition Plan: At discretion of primary team/cardiology Isolation: None  Consultants:  [facility]  Procedures: 2-D echocardiogram (03/31/12) pending  Antibiotics: None  HPI/Subjective: No specific complaints   Objective: Blood pressure 125/43, pulse 105, temperature 98.7 F (37.1 C), temperature source Oral, resp. rate 15, height 5\' 3"  (1.6 m), weight 86.183 kg (190 lb), SpO2 95.00%.  Intake/Output Summary (Last 24 hours) at 03/31/12 1500 Last data filed at 03/31/12 1400  Gross per 24 hour  Intake 666.68 ml  Output   4526 ml  Net -3859.32 ml     Exam: General: No acute respiratory distress Lungs: Clear to auscultation bilaterally without wheezes or crackles, RA Cardiovascular: Regular rate and rhythm without murmur gallop or rub normal S1 and S2 Abdomen: Nontender, nondistended, soft, bowel sounds positive, no rebound, no ascites, no appreciable mass Extremities: No significant cyanosis, clubbing, or edema bilateral lower extremities  Data Reviewed: Basic Metabolic Panel:  Recent Labs Lab 03/30/12 2116 03/31/12 0500  NA 136 140  K 5.7* 4.1  CL 95* 99  CO2 25 29  GLUCOSE 443* 158*  BUN 37* 40*  CREATININE 1.42* 1.26*  CALCIUM 9.7 9.3   Liver Function Tests:  Recent Labs Lab 03/30/12 2116 03/31/12 0500  AST 45* 54*  ALT 23 22  ALKPHOS 54 48  BILITOT 0.6 0.5  PROT 7.2 6.5  ALBUMIN 3.7 3.4*   No results found for this basename: LIPASE, AMYLASE,  in the last 168 hours No results found for this basename: AMMONIA,  in the last 168  hours CBC:  Recent Labs Lab 03/30/12 2116  WBC 14.9*  HGB 12.4  HCT 38.2  MCV 103.0*  PLT 470*   Cardiac Enzymes:  Recent Labs Lab 03/30/12 2116 03/31/12 0500  TROPONINI 1.11* 7.82*   BNP (last 3 results)  Recent Labs  03/30/12 2116  PROBNP 2717.0*   CBG:  Recent Labs Lab 03/31/12 0458 03/31/12 0602 03/31/12 0655 03/31/12 0801 03/31/12 1147  GLUCAP 159* 134* 112* 119* 169*    Recent Results (from the past 240 hour(s))  MRSA PCR SCREENING     Status: None   Collection Time    03/31/12  1:50 AM      Result Value Range Status   MRSA by PCR NEGATIVE  NEGATIVE Final   Comment:            The GeneXpert MRSA Assay (FDA     approved for NASAL specimens     only), is one component of a     comprehensive MRSA colonization     surveillance program. It is not     intended to diagnose MRSA     infection nor to guide or     monitor treatment for     MRSA infections.     Studies:  Recent x-ray studies have been reviewed in detail by the Attending Physician  Scheduled Meds:  Reviewed in detail by the Attending Physician   Junious Silk, ANP [facility] Office  [phone] Pager [phone]  On-Call/Text Page:      [email or web address]      password [email or web address]  If 7PM-7AM, please contact night-coverage [email or web address] Password [email or web address] 03/31/2012, 3:00 PM   LOS: 1 day   I have examined the patient, reviewed the chart and modified the above note which I agree with.   [name],SAIMA,MD [phone] 03/31/2012, 5:45 PM

## 2012-03-31 NOTE — Progress Notes (Signed)
  Echocardiogram 2D Echocardiogram has been performed.  Georgian Co 03/31/2012, 2:40 PM

## 2012-03-31 NOTE — ED Notes (Signed)
Per EDP, wait until the patient arrives on the floor to receive her foley and obtain her urine sample.

## 2012-03-31 NOTE — Consult Note (Addendum)
[facility] Medical Consultation  [name] AVW:[id number] DOB: Apr 04, 1940 DOA: 03/30/2012 PCP: Londell Moh, MD   Requesting physician: Dr. Nanetta Batty  Date of consultation: 03/31/2012  Reason for consultation: Assistance in evaluation and management of diabetes mellitus  Impression/Recommendations Active Problems:   CAD, CABG 1991, redo 2006, last cath Dec 2011, medical Rx   H/O mechanical aortic valve replacement, St Jude 1991   Chronic anticoagulation with Coumadin   Dyslipidemia   HTN (hypertension)   Cardiomyopathy, ischemic, EF 35-40% 2D Nov 2011   Type 2 diabetes mellitus, uncontrolled   Hyperkalemia    1. Uncontrolled type 2 diabetes mellitus/IDDM: Not in DKA. Patient will be admitted to CCU. Will start on insulin drip and monitor closely. When she has reached acceptable control on insulin drip, will transition back to home Lantus and add SSI and low dose mealtime NovoLog while hospitalized. Check hemoglobin A1c. Her current hyperglycemia is probably precipitated by acute illness. Hold oral hypoglycemics. 2. Acute renal failure: ? Cardiogenic from hypoperfusion and complicated by ARB. Followup BMP in a.m. post diuresis. 3. Hyperkalemia: Secondary to acute renal failure, hyperglycemia, ARB and potassium supplements. Patient is diuresing well with Lasix. Insulin should also help. Followup BMP in a.m. 4. Leukocytosis: Likely stress margination. No clinical focus of sepsis. 5. Acute on chronic systolic CHF/CAD: Management per cardiology/primary service. 6. Hypertension: Controlled. 7. Mechanical mitral valve/chronic Coumadin: Management per primary service  [facility] will followup again tomorrow. Please contact me if I can be of assistance in the meanwhile. Thank you for this consultation.  Chief Complaint: Worsening dyspnea  HPI:  72 year old female patient with history of DM 2/IDDM, who is on a Lantus study for the last 1 year, HTN, HL intolerant of statins,  CAD status post CABG and PCI, chronic systolic CHF presented to ED on 03/30/12 with acute onset of dyspnea. She claims compliance with medications but not sure of dietary compliance. At 8:30 PM last night, she started having worsening dyspnea but without any chest pain or palpitations. She presented to the ED where code STEMI was called which was canceled by cardiology. She was found to be in decompensated CHF. IV Lasix was started with good effect. Patient currently indicates that she is feeling much better with significant improvement in her dyspnea. Her lab work showed potassium 5.7, creatinine 1.4 and blood glucose of 443 mg/dL. Patient claims compliance to her Lantus and home CBGs range between 125-140 mg/dL. Blood sugars less than 90 mg/dL cause symptomatic hypoglycemia and her. The hospitalist service was requested to assist in her diabetes management.  Review of Systems:  All systems reviewed and apart from history of presenting illness, are negative  Past Medical History  Diagnosis Date  . High blood pressure   . Angina at rest   . Irregular heart rhythm   . Diabetes mellitus   . High cholesterol   . Coronary artery disease   . COPD (chronic obstructive pulmonary disease)   . Anemia   . Irregular heart beat   . CHF (congestive heart failure)   . Myocardial infarction march 2013  . Peripheral vascular disease    Past Surgical History  Procedure Laterality Date  . Gallbadder  2003  . Cardiac valve replacement  1991  . Heart bypass  1991/2005  . Angioplasty    . Coronary artery bypass graft    . Aortic valve replacement    . Appendectomy    . Abdominal hysterectomy    . Cholecystectomy    . Esophagogastroduodenoscopy  03/31/2011    Procedure: ESOPHAGOGASTRODUODENOSCOPY (EGD);  Surgeon: Beverley Fiedler, MD;  Location: [facility] ENDOSCOPY;  Service: Gastroenterology;  Laterality: N/A;  . Bypass graft  1991 and 2005   Social History:  reports that she quit smoking about 39 years ago. Her  smoking use included Cigarettes. She has a 7.5 pack-year smoking history. She has never used smokeless tobacco. She reports that she does not drink alcohol or use illicit drugs. Patient is married. Lives with her husband. 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 Independant of activities of daily living.  Allergies  Allergen Reactions  . Shellfish Allergy Anaphylaxis  . Codeine     Feels sick   Family History  Problem Relation Age of Onset  . Rheum arthritis Mother   . Cervical cancer Mother   . Stomach cancer Mother   . Cancer Mother   . Hyperlipidemia Mother   . Hypertension Mother   . Rheum arthritis Father   . Rheum arthritis Paternal Aunt   . Anesthesia problems Neg Hx   . Deep vein thrombosis Brother   . Hypertension Brother   . Hypertension Daughter     Prior to Admission medications   Medication Sig Start Date End Date Taking? Authorizing Abdon Petrosky  aspirin 81 MG tablet Take 81 mg by mouth daily.      Historical Michille Mcelrath, MD  atorvastatin (LIPITOR) 40 MG tablet Take 40 mg by mouth daily.      Historical Octa Uplinger, MD  calcium carbonate (OS-CAL) 600 MG TABS Take 600 mg by mouth 2 (two) times daily.      Historical Lyrick Lagrand, MD  DULoxetine (CYMBALTA) 60 MG capsule Take 60 mg by mouth daily.      Historical William Schake, MD  fenofibrate 54 MG tablet Take 54 mg by mouth daily.      Historical Zymiere Trostle, MD  furosemide (LASIX) 40 MG tablet Take 40 mg by mouth 2 (two) times daily.      Historical Timya Trimmer, MD  glimepiride (AMARYL) 1 MG tablet Take 1 mg by mouth daily.      Historical Kamrin Sibley, MD  HYDROcodone-acetaminophen (NORCO) 7.5-325 MG per tablet Take 1 tablet by mouth at bedtime as needed. For pain    Historical Ridley Schewe, MD  isosorbide mononitrate (IMDUR) 30 MG 24 hr tablet Take 1 tablet (30 mg total) by mouth daily. 04/02/11 04/01/12  Russella Dar, NP  LOSARTAN POTASSIUM PO Take 100 mg by mouth daily.      Historical Onisha Cedeno, MD  metoprolol succinate (TOPROL-XL) 25 MG 24 hr  tablet Take 50 mg by mouth daily. Take with or immediately following a meal. 04/02/11 04/01/12  Russella Dar, NP  Multiple Vitamin (MULTIVITAMIN) capsule Take 1 capsule by mouth daily.    Historical Avonne Berkery, MD  mupirocin ointment (BACTROBAN) 2 % Apply topically as needed. 05/12/11   Historical Beautifull Cisar, MD  Omeprazole 20 MG TBEC Take 20 mg by mouth at bedtime.    Historical Daelen Belvedere, MD  pantoprazole (PROTONIX) 40 MG tablet Take 1 tablet (40 mg total) by mouth 2 (two) times daily before a meal. 04/02/11 06/01/12  Russella Dar, NP  POTASSIUM CHLORIDE PO Take 20 mEq by mouth. Monday, Wednesday, Friday     Historical Nasteho Glantz, MD  PRESCRIPTION MEDICATION Inject 54 Units into the skin daily.    Historical Kinjal Neitzke, MD  temazepam (RESTORIL) 30 MG capsule Take 30 mg by mouth at bedtime as needed.    Historical Chanie Soucek, MD  vitamin B-12 (CYANOCOBALAMIN) 1000 MCG tablet Take 1,000 mcg by  mouth 2 (two) times daily.      Historical Janeene Sand, MD  warfarin (COUMADIN) 2.5 MG tablet Take 2.5 mg on Sun, Tues, Thurs & Sat Take 5 mg on Mon, Wed & Fri. 04/02/11   Hollice Espy, MD   Physical Exam: Blood pressure 135/58, pulse 101, temperature 98.3 F (36.8 C), temperature source Oral, resp. rate 23, height 5\' 3"  (1.6 m), weight 86.183 kg (190 lb), SpO2 100.00%. Filed Vitals:   03/30/12 2330 03/30/12 2345 03/31/12 0000 03/31/12 0015  BP: 133/53 124/42 146/63 135/58  Pulse: 98 93 101 101  Temp:      TempSrc:      Resp: 20 23 18 23   Height:      Weight:      SpO2: 100% 100% 99% 100%     General exam: Moderately built and nourished female patient, sitting up on the gurney with mild increased work of breathing. Able to speak in full sentences however.  Head, eyes and ENT: Nontraumatic and normocephalic. Pupils equally reacting to light and accommodation.  Neck: Supple. No carotid bruit or thyromegaly. JVD present.  Lymphatics: No lymphadenopathy.  Respiratory system: Reduced breath sounds  bilaterally, especially in the bases with bibasal crackles. No wheezing or rhonchi. No increased work of breathing.  Cardiovascular system: S1 and S2 heard, RRR. Click of mechanical valve heard. JVD present. Trace bilateral leg edema. No gallops or murmurs heard.  Gastrointestinal system: Abdomen is nondistended, soft and nontender. Normal bowel sounds heard. No organomegaly or masses appreciated.  Central nervous system: Alert and oriented. No focal neurological deficits.  Extremities: Symmetric 5 x 5 power. Peripheral pulses symmetrically felt.  Musculoskeletal system: Negative exam.  Psychiatry: Pleasant and cooperative.   Labs on Admission:  Basic Metabolic Panel:  Recent Labs Lab 03/30/12 2116  NA 136  K 5.7*  CL 95*  CO2 25  GLUCOSE 443*  BUN 37*  CREATININE 1.42*  CALCIUM 9.7   Liver Function Tests:  Recent Labs Lab 03/30/12 2116  AST 45*  ALT 23  ALKPHOS 54  BILITOT 0.6  PROT 7.2  ALBUMIN 3.7   No results found for this basename: LIPASE, AMYLASE,  in the last 168 hours No results found for this basename: AMMONIA,  in the last 168 hours CBC:  Recent Labs Lab 03/30/12 2116  WBC 14.9*  HGB 12.4  HCT 38.2  MCV 103.0*  PLT 470*   Cardiac Enzymes:  Recent Labs Lab 03/30/12 2116  TROPONINI 1.11*   BNP: No components found with this basename: POCBNP,  CBG:  Recent Labs Lab 03/31/12 0036  GLUCAP 342*    Radiological Exams on Admission: Dg Chest Port 1 View  03/30/2012  *RADIOLOGY REPORT*  Clinical Data: Respiratory distress, shortness of breath, question CHF, history hypertension, diabetes, arrhythmia, coronary artery disease, COPD, MI  PORTABLE CHEST - 1 VIEW  Comparison: Portable exam 2131 hours compared to 03/31/2011 at 0505 hours  Findings: Enlargement of cardiac silhouette post CABG. Calcified tortuous aorta. Pulmonary vascular congestion. Interstitial infiltrates bilaterally new since previous exam likely representing pulmonary edema and  CHF. No pleural effusion or pneumothorax. Bones diffusely demineralized.  IMPRESSION: CHF.   Original Report Authenticated By: Ulyses Southward, M.D.     EKG: Not seen on Epic. Per cardiology diffuse ST segment changes.  Discussed with patient's 3 daughters at bedside.  Time spent:  50 minutes  [facility] Pager [phone]  If 7PM-7AM, please contact night-coverage [email or web address] Password [facility] 03/31/2012, 12:46 AM

## 2012-03-31 NOTE — Progress Notes (Signed)
[facility]  Subjective: SOB has resolved. She denies any chest pain.   Objective: Vital signs in last 24 hours: Temp:  [98.2 F (36.8 C)-98.3 F (36.8 C)] 98.2 F (36.8 C) (03/14 0200) Pulse Rate:  [88-128] 105 (03/14 0115) Resp:  [15-35] 18 (03/14 0604) BP: (111-170)/(42-117) 128/62 mmHg (03/14 0604) SpO2:  [92 %-100 %] 95 % (03/14 0604) Weight:  [190 lb (86.183 kg)] 190 lb (86.183 kg) (03/13 2112)    Intake/Output from previous day: 03/13 0701 - 03/14 0700 In: 42.2 [I.V.:42.2] Out: 2500 [Urine:2500] Intake/Output this shift:    Medications Current Facility-Administered Medications  Medication Dose Route Frequency Senon Nixon Last Rate Last Dose  . 0.9 %  sodium chloride infusion   Intravenous Continuous Elease Etienne, MD      . acetaminophen (TYLENOL) tablet 650 mg  650 mg Oral Q4H PRN Abelino Derrick, PA-C      . ALPRAZolam Prudy Feeler) tablet 0.25 mg  0.25 mg Oral BID PRN Abelino Derrick, PA-C      . aspirin chewable tablet 81 mg  81 mg Oral Daily Runell Gess, MD      . atorvastatin (LIPITOR) tablet 40 mg  40 mg Oral Daily Luke K Kilroy, PA-C      . calcium carbonate (OS-CAL - dosed in mg of elemental calcium) tablet 500 mg of elemental calcium  1 tablet Oral BID WC Eda Paschal Kilroy, PA-C   500 mg of elemental calcium at 03/31/12 0753  . dextrose 50 % solution 25 mL  25 mL Intravenous PRN Eda Paschal Kilroy, PA-C      . dextrose 50 % solution 25 mL  25 mL Intravenous PRN Elease Etienne, MD      . DULoxetine (CYMBALTA) DR capsule 60 mg  60 mg Oral Daily Luke K Kilroy, PA-C      . fenofibrate tablet 54 mg  54 mg Oral Daily Luke K Kilroy, PA-C      . furosemide (LASIX) injection 40 mg  40 mg Intravenous BID Abelino Derrick, PA-C   40 mg at 03/31/12 0754  . glimepiride (AMARYL) tablet 1 mg  1 mg Oral Q breakfast Eda Paschal [place], PA-C   1 mg at 03/31/12 0754  . HYDROcodone-acetaminophen (NORCO) 7.5-325 MG per tablet 1 tablet  1 tablet Oral Q6H PRN Abelino Derrick, PA-C      . insulin aspart (novoLOG) injection 0-15 Units  0-15 Units Subcutaneous TID WC Russella Dar, NP      . insulin aspart (novoLOG) injection 0-5 Units  0-5 Units Subcutaneous QHS Russella Dar, NP      . insulin glargine (LANTUS) injection 15 Units  15 Units Subcutaneous Daily Russella Dar, NP      . metoprolol succinate (TOPROL-XL) 24 hr tablet 50 mg  50 mg Oral Q breakfast Abelino Derrick, PA-C   50 mg at 03/31/12 0753  . multivitamin with minerals tablet 1 tablet  1 tablet Oral Daily Luke K Kilroy, PA-C      . mupirocin ointment (BACTROBAN) 2 %   Topical BID Eda Paschal Kilroy, PA-C      . nitroGLYCERIN (NITROSTAT) SL tablet 0.4 mg  0.4 mg Sublingual Q5 Min x 3 PRN Eda Paschal Kilroy, PA-C      . nitroGLYCERIN 0.2 mg/mL in dextrose 5 % infusion  5 mcg/min Intravenous Titrated Luke K Kilroy, PA-C      . ondansetron [facility]) injection 4 mg  4  mg Intravenous Q6H PRN Abelino Derrick, PA-C      . pantoprazole (PROTONIX) EC tablet 40 mg  40 mg Oral Daily Luke K Kilroy, PA-C      . temazepam (RESTORIL) capsule 30 mg  30 mg Oral QHS PRN Eda Paschal Kilroy, PA-C      . vitamin B-12 (CYANOCOBALAMIN) tablet 1,000 mcg  1,000 mcg Oral BID Abelino Derrick, PA-C      . Warfarin - Pharmacist Dosing Inpatient   Does not apply q1800 Colleen Can, [facility]        PE: General appearance: alert, cooperative and no distress Lungs: clear to auscultation bilaterally Heart: + mechanical valve sounds Extremities: no LEE Pulses: 2+ and symmetric Skin: warm and dry Neurologic: Grossly normal  Lab Results:   Recent Labs  03/30/12 2116  WBC 14.9*  HGB 12.4  HCT 38.2  PLT 470*   BMET  Recent Labs  03/30/12 2116 03/31/12 0500  NA 136 140  K 5.7* 4.1  CL 95* 99  CO2 25 29  GLUCOSE 443* 158*  BUN 37* 40*  CREATININE 1.42* 1.26*  CALCIUM 9.7 9.3   PT/INR  Recent Labs  03/30/12 2116 03/31/12 0500  LABPROT 26.3* 25.4*  INR 2.56* 2.44*    Cardiac Panel (last 3 results)  Recent Labs   03/30/12 2116 03/31/12 0500  TROPONINI 1.11* 7.82*   Assessment/Plan  Principal Problem:   NSTEMI, secondary to demand ischemia, Troponin 0.7 Active Problems:   CAD, CABG 1991, redo 2006, last cath Dec 2011, medical Rx   H/O mechanical aortic valve replacement, St Jude 1991   Chronic anticoagulation with Coumadin   Dyslipidemia   HTN (hypertension)   Cardiomyopathy, ischemic, EF 35-40% 2D Nov 2011   Type 2 diabetes mellitus, uncontrolled   Hyperkalemia   Acute renal failure   Leukocytosis  Plan:  Admitted for NSTEMI overnight. SOB has resolved. Currently CP free. HR and BP both stable. Keep on IV NTG. Coumadin is being held. Plan to transition to IV heparin. Plan for [facility] on Monday. Will transfer to TCU.     LOS: 1 day    Brittainy M. Delmer Islam 03/31/2012 8:21 AM  Agree with note written by Boyce Medici  PAC  Pt admitted late last night with flash pulmonary edema. She quickly responded to iv diuresis and BiPap. Currently she is off of O2 and comfortable. Trop went up to 7. INR therapeutic. Scr improved as well. Exam benign. Still has crackles L base. No periph edema. Crisp VS. Plan to hold coumadin and transition to IV hep once INR drifts below 2. I worry that her flash pulmonary edema might be ischemically mediated given the age of her grafts. Plan cath on Monday. Appreciate [facility] assistance with management.   Runell Gess 03/31/2012 10:41 AM

## 2012-03-31 NOTE — Progress Notes (Addendum)
ANTICOAGULATION CONSULT NOTE - Initial Consult  Pharmacy Consult for heparin bridge therapy Indication: NSTEMI, on coumadin PTA for Afib, for [facility] 04/03/12  Allergies  Allergen Reactions  . Shellfish Allergy Anaphylaxis  . Codeine     Feels sick    Patient Measurements: Height: 5\' 3"  (160 cm) Weight: 190 lb (86.183 kg) IBW/kg (Calculated) : 52.4 ABW 65.9 kg   Vital Signs: Temp: 98.2 F (36.8 C) (03/14 2010) Temp src: Oral (03/14 1915) BP: 123/39 mmHg (03/14 2010)  Labs:  Recent Labs  03/30/12 2116 03/31/12 0500 03/31/12 2041  HGB 12.4  --   --   HCT 38.2  --   --   PLT 470*  --   --   LABPROT 26.3* 25.4*  --   INR 2.56* 2.44*  --   HEPARINUNFRC  --   --  0.11*  CREATININE 1.42* 1.26*  --   TROPONINI 1.11* 7.82*  --     Estimated Creatinine Clearance: 42.6 ml/min (by C-G formula based on Cr of 1.26).   Medical History: Past Medical History  Diagnosis Date  . High blood pressure   . Angina at rest   . Irregular heart rhythm   . Diabetes mellitus   . High cholesterol   . Coronary artery disease   . COPD (chronic obstructive pulmonary disease)   . Anemia   . Irregular heart beat   . CHF (congestive heart failure)   . Myocardial infarction march 2013  . Peripheral vascular disease     Medications:  Coumadin 5 mg M, F and 2.5 mg TTSSW, last dose Wed 3/12.  Assessment: [name] is a 72 yo F admitted with NSTEMI 2nd demand ischemia.  Pharmacy has been consulted to start bridge therapy heparin so she can go for [facility] 04/03/12.  She is on coumadin PTA for afib and St. Jude AVR '91.  Her INR is therapeutic at 2.44. Her last dose of coumadin was 2.5 mg on Wed 3/12.  Her H/H is 12.4/38.2 and her PLTC is sl high at 470.  No bleeding reported.   Heparin was started this morning at 9 ml/hr, and initial level subtherapeutic at 0.11.  I just spoke with L. Stone, PA, will hold heparin for now until INR down < 2.    Goal of Therapy:  Heparin level 0.3-0.7  units/ml Monitor platelets by anticoagulation protocol: Yes   Plan:  1. D/C IV heparin. 2. F/U AM INR. 3. Start heparin when INR < 2.  Tad Moore, BCPS  Clinical Pharmacist Pager [phone]  03/31/2012 9:30 PM

## 2012-03-31 NOTE — Progress Notes (Signed)
ANTICOAGULATION CONSULT NOTE - Initial Consult  Pharmacy Consult for heparin bridge therapy Indication: NSTEMI, on coumadin PTA for Afib, for [facility] 04/03/12  Allergies  Allergen Reactions  . Shellfish Allergy Anaphylaxis  . Codeine     Feels sick    Patient Measurements: Height: 5\' 3"  (160 cm) Weight: 190 lb (86.183 kg) IBW/kg (Calculated) : 52.4 ABW 65.9 kg   Vital Signs: Temp: 98 F (36.7 C) (03/14 0800) Temp src: Oral (03/14 0800) BP: 135/65 mmHg (03/14 0800) Pulse Rate: 105 (03/14 0115)  Labs:  Recent Labs  03/30/12 2116 03/31/12 0500  HGB 12.4  --   HCT 38.2  --   PLT 470*  --   LABPROT 26.3* 25.4*  INR 2.56* 2.44*  CREATININE 1.42* 1.26*  TROPONINI 1.11* 7.82*    Estimated Creatinine Clearance: 42.6 ml/min (by C-G formula based on Cr of 1.26).   Medical History: Past Medical History  Diagnosis Date  . High blood pressure   . Angina at rest   . Irregular heart rhythm   . Diabetes mellitus   . High cholesterol   . Coronary artery disease   . COPD (chronic obstructive pulmonary disease)   . Anemia   . Irregular heart beat   . CHF (congestive heart failure)   . Myocardial infarction march 2013  . Peripheral vascular disease     Medications:  Coumadin 5 mg M, F and 2.5 mg TTSSW, last dose Wed 3/12.  Assessment: [name] is a 72 yo F admitted with NSTEMI 2nd demand ischemia.  Pharmacy has been consulted to start bridge therapy heparin so she can go for [facility] 04/03/12.  She is on coumadin PTA for afib and [employer] Jude AVR '91.  Her INR is therapeutic at 2.44. Her last dose of coumadin was 2.5 mg on Wed 3/12.  Her H/H is 12.4/38.2 and her PLTC is sl high at 470.  No bleeding reported.   Goal of Therapy:  Heparin level 0.3-0.7 units/ml Monitor platelets by anticoagulation protocol: Yes   Plan:  1. No heparin bolus 2nd INR 2. Heparin drip at 14 units/kg/ABW = 900 units/hr 3. 8 hr HL and daily HL, INR and CBC  Herby Abraham,  Pharm.D. [phone] 03/31/2012 9:08 AM

## 2012-03-31 NOTE — ED Provider Notes (Signed)
I saw and evaluated the patient, reviewed the resident's note and I agree with the findings and plan. Pt with hx chf, presents w severe dyspnea, orthopnea. Had chest tightness earlier, no current or ongoing chest pain. Pt arrives via ems, bipap.   Iv ns, monitor, continuous pulse ox and continuous cardiac monitoring. Stat ecg.  Cardiology paged prior to arrival as possible code stemi.  Ed ecg compared to prior, lvh w repol abnormalities, unchanged from prior. Code stemi cancelled by cardiology service.  Stat portable cxr. ntg iv. Lasix iv. Respiratory therapy called, bipap. Labs.  Multiple rechecks of pts condition. w above tx, dyspnea slowly improving.  bipap initially continued. Good uo, bp improved from initial.   Cardiology service called back when ED labs and workup completed.   Discussed labs/xr w pt.   Admit to ccu.  Dx acute respiratory distress, severe dyspnea, CHF, uncontrolled hypertension, elevated troponin  ?nstemi.   CRITICAL CARE Performed by: Suzi Roots   Total critical care time: 35  Critical care time was exclusive of separately billable procedures and treating other patients.  Critical care was necessary to treat or prevent imminent or life-threatening deterioration.  Critical care was time spent personally by me on the following activities: development of treatment plan with patient and/or surrogate as well as nursing, discussions with consultants, evaluation of patient's response to treatment, examination of patient, obtaining history from patient or surrogate, ordering and performing treatments and interventions, ordering and review of laboratory studies, ordering and review of radiographic studies, pulse oximetry and re-evaluation of patient's condition.   Suzi Roots, MD 03/31/12 1126

## 2012-03-31 NOTE — Progress Notes (Signed)
ANTICOAGULATION CONSULT NOTE - Initial Consult  Pharmacy Consult for Coumadin Indication: atrial fibrillation  Allergies  Allergen Reactions  . Shellfish Allergy Anaphylaxis  . Codeine     Feels sick    Patient Measurements: Height: 5\' 3"  (160 cm) Weight: 190 lb (86.183 kg) IBW/kg (Calculated) : 52.4  Vital Signs: Temp: 98.3 F (36.8 C) (03/13 2112) Temp src: Oral (03/13 2112) BP: 133/58 mmHg (03/14 0100) Pulse Rate: 88 (03/14 0100)  Labs:  Recent Labs  03/30/12 2116  HGB 12.4  HCT 38.2  PLT 470*  LABPROT 26.3*  INR 2.56*  CREATININE 1.42*  TROPONINI 1.11*    Estimated Creatinine Clearance: 37.8 ml/min (by C-G formula based on Cr of 1.42).   Medical History: Past Medical History  Diagnosis Date  . High blood pressure   . Angina at rest   . Irregular heart rhythm   . Diabetes mellitus   . High cholesterol   . Coronary artery disease   . COPD (chronic obstructive pulmonary disease)   . Anemia   . Irregular heart beat   . CHF (congestive heart failure)   . Myocardial infarction march 2013  . Peripheral vascular disease     Medications:  Prescriptions prior to admission  Medication Sig Dispense Refill  . aspirin 81 MG tablet Take 81 mg by mouth daily.        Marland Kitchen atorvastatin (LIPITOR) 40 MG tablet Take 40 mg by mouth daily.        . calcium carbonate (OS-CAL) 600 MG TABS Take 600 mg by mouth 2 (two) times daily.        . DULoxetine (CYMBALTA) 60 MG capsule Take 60 mg by mouth daily.        . fenofibrate 54 MG tablet Take 54 mg by mouth daily.        . furosemide (LASIX) 40 MG tablet Take 40 mg by mouth 2 (two) times daily.        Marland Kitchen glimepiride (AMARYL) 1 MG tablet Take 1 mg by mouth daily.        Marland Kitchen HYDROcodone-acetaminophen (NORCO) 7.5-325 MG per tablet Take 1 tablet by mouth at bedtime as needed. For pain      . isosorbide mononitrate (IMDUR) 30 MG 24 hr tablet Take 1 tablet (30 mg total) by mouth daily.  30 tablet  0  . LOSARTAN POTASSIUM PO Take 100  mg by mouth daily.        . metoprolol succinate (TOPROL-XL) 25 MG 24 hr tablet Take 50 mg by mouth daily. Take with or immediately following a meal.      . Multiple Vitamin (MULTIVITAMIN) capsule Take 1 capsule by mouth daily.      . mupirocin ointment (BACTROBAN) 2 % Apply topically as needed.      . Omeprazole 20 MG TBEC Take 20 mg by mouth at bedtime.      . pantoprazole (PROTONIX) 40 MG tablet Take 1 tablet (40 mg total) by mouth 2 (two) times daily before a meal.  60 tablet  3  . POTASSIUM CHLORIDE PO Take 20 mEq by mouth. Monday, Wednesday, Friday       . PRESCRIPTION MEDICATION Inject 54 Units into the skin daily.      . temazepam (RESTORIL) 30 MG capsule Take 30 mg by mouth at bedtime as needed.      . vitamin B-12 (CYANOCOBALAMIN) 1000 MCG tablet Take 1,000 mcg by mouth 2 (two) times daily.        Marland Kitchen  warfarin (COUMADIN) 2.5 MG tablet Take 2.5 mg on Sun, Tues, Thurs & Sat Take 5 mg on Mon, Wed & Fri.       Scheduled:  . [COMPLETED] acetaminophen  650 mg Oral Once  . aspirin  81 mg Oral Daily  . atorvastatin  40 mg Oral Daily  . calcium carbonate  1 tablet Oral BID WC  . DULoxetine  60 mg Oral Daily  . fenofibrate  54 mg Oral Daily  . [COMPLETED] furosemide  40 mg Intravenous Once  . [COMPLETED] furosemide  40 mg Intravenous Once  . furosemide  40 mg Intravenous BID  . glimepiride  1 mg Oral Q breakfast  . [COMPLETED] insulin aspart  10 Units Subcutaneous Once  . insulin regular  0-10 Units Intravenous TID WC  . metoprolol succinate  50 mg Oral Q breakfast  . multivitamin with minerals  1 tablet Oral Daily  . mupirocin ointment   Topical BID  . [COMPLETED] nitroGLYCERIN  1 inch Topical Once  . [COMPLETED] nitroGLYCERIN  2-200 mcg/min Intravenous Once  . pantoprazole  40 mg Oral Daily  . sodium chloride  3 mL Intravenous Q12H  . vitamin B-12  1,000 mcg Oral BID  . [DISCONTINUED] insulin regular  0-10 Units Intravenous TID WC  . [DISCONTINUED] nitroGLYCERIN  2-200 mcg/min  Intravenous Once    Assessment: 72yo female admitted with hyperglycemia and decompensated CHF, to continue Coumadin for Afib; admitted with therapeutic INR.  Goal of Therapy:  INR 2-3   Plan:  Will verify Coumadin dosing with family and continue home dose as monitor INR.  Vernard Gambles, PharmD, BCPS  03/31/2012,2:41 AM

## 2012-04-01 ENCOUNTER — Encounter (HOSPITAL_COMMUNITY)
Admission: EM | Disposition: A | Discharge status: Home / Self Care | Payer: Self-pay | Source: Home / Self Care | Attending: Cardiovascular Disease

## 2012-04-01 ENCOUNTER — Encounter (HOSPITAL_COMMUNITY): Payer: Self-pay | Admitting: Interventional Cardiology

## 2012-04-01 ENCOUNTER — Ambulatory Visit (HOSPITAL_COMMUNITY): Admit: 2012-04-01 | Payer: Self-pay | Admitting: Interventional Cardiology

## 2012-04-01 DIAGNOSIS — I2510 Atherosclerotic heart disease of native coronary artery without angina pectoris: Secondary | ICD-10-CM

## 2012-04-01 DIAGNOSIS — I214 Non-ST elevation (NSTEMI) myocardial infarction: Secondary | ICD-10-CM

## 2012-04-01 DIAGNOSIS — I469 Cardiac arrest, cause unspecified: Secondary | ICD-10-CM | POA: Diagnosis present

## 2012-04-01 DIAGNOSIS — I509 Heart failure, unspecified: Secondary | ICD-10-CM

## 2012-04-01 HISTORY — PX: LEFT HEART CATHETERIZATION WITH CORONARY ANGIOGRAM: SHX5451

## 2012-04-01 LAB — CBC
HCT: 38.8 % (ref 36.0–46.0)
Hemoglobin: 12.8 g/dL (ref 12.0–15.0)
MCH: 33.3 pg (ref 26.0–34.0)
MCHC: 33 g/dL (ref 30.0–36.0)
MCV: 101 fL — ABNORMAL HIGH (ref 78.0–100.0)
Platelets: 445 10*3/uL — ABNORMAL HIGH (ref 150–400)
RBC: 3.84 MIL/uL — ABNORMAL LOW (ref 3.87–5.11)
RDW: 14.7 % (ref 11.5–15.5)
WBC: 11.8 10*3/uL — ABNORMAL HIGH (ref 4.0–10.5)

## 2012-04-01 LAB — GLUCOSE, CAPILLARY
Glucose-Capillary: 132 mg/dL — ABNORMAL HIGH (ref 70–99)
Glucose-Capillary: 141 mg/dL — ABNORMAL HIGH (ref 70–99)
Glucose-Capillary: 278 mg/dL — ABNORMAL HIGH (ref 70–99)
Glucose-Capillary: 294 mg/dL — ABNORMAL HIGH (ref 70–99)

## 2012-04-01 LAB — PHOSPHORUS: Phosphorus: 3.2 mg/dL (ref 2.3–4.6)

## 2012-04-01 LAB — PROTIME-INR
INR: 1.68 — ABNORMAL HIGH (ref 0.00–1.49)
Prothrombin Time: 19.2 seconds — ABNORMAL HIGH (ref 11.6–15.2)

## 2012-04-01 LAB — MAGNESIUM: Magnesium: 2.2 mg/dL (ref 1.5–2.5)

## 2012-04-01 LAB — BASIC METABOLIC PANEL
CO2: 26 mEq/L (ref 19–32)
Calcium: 9.8 mg/dL (ref 8.4–10.5)
Potassium: 3.9 mEq/L (ref 3.5–5.1)
Sodium: 133 mEq/L — ABNORMAL LOW (ref 135–145)

## 2012-04-01 LAB — TROPONIN I: Troponin I: 2.73 ng/mL (ref <0.30)

## 2012-04-01 SURGERY — LEFT HEART CATHETERIZATION WITH CORONARY ANGIOGRAM
Anesthesia: LOCAL | Laterality: Bilateral

## 2012-04-01 MED ORDER — FENTANYL CITRATE 0.05 MG/ML IJ SOLN
INTRAMUSCULAR | Status: AC
  Filled 2012-04-01: qty 2

## 2012-04-01 MED ORDER — METHOTREXATE 2.5 MG PO TABS: 15.0000 mg | ORAL_TABLET | ORAL | Status: DC

## 2012-04-01 MED ORDER — HEPARIN (PORCINE) IN NACL 100-0.45 UNIT/ML-% IJ SOLN
1200.0000 [IU]/h | INTRAMUSCULAR | Status: DC
  Administered 2012-04-01: 1200 [IU]/h via INTRAVENOUS
  Filled 2012-04-01: qty 250

## 2012-04-01 MED ORDER — LIDOCAINE IN D5W 4-5 MG/ML-% IV SOLN
1.0000 mg/min | INTRAVENOUS | Status: DC
  Administered 2012-04-01: 2 mg/min via INTRAVENOUS
  Filled 2012-04-01: qty 250

## 2012-04-01 MED ORDER — PREDNISONE 10 MG PO TABS
10.0000 mg | ORAL_TABLET | Freq: Every day | ORAL | Status: DC
  Administered 2012-04-01 – 2012-04-07 (×7): 10 mg via ORAL
  Filled 2012-04-01 (×6): qty 1

## 2012-04-01 MED ORDER — MIDAZOLAM HCL 2 MG/2ML IJ SOLN
INTRAMUSCULAR | Status: AC
  Filled 2012-04-01: qty 2

## 2012-04-01 MED ORDER — BIVALIRUDIN 250 MG IV SOLR
INTRAVENOUS | Status: AC
  Filled 2012-04-01: qty 250

## 2012-04-01 MED ORDER — GABAPENTIN 300 MG PO CAPS: 300.0000 mg | ORAL_CAPSULE | Freq: Every day | ORAL | Status: DC | PRN

## 2012-04-01 MED ORDER — INSULIN GLARGINE 100 UNIT/ML ~~LOC~~ SOLN
64.0000 [IU] | Freq: Every day | SUBCUTANEOUS | Status: DC
  Administered 2012-04-02 – 2012-04-06 (×5): 64 [IU] via SUBCUTANEOUS
  Filled 2012-04-01 (×2): qty 0.64

## 2012-04-01 MED ORDER — CEFAZOLIN SODIUM-DEXTROSE 2-3 GM-% IV SOLR
2.0000 g | Freq: Once | INTRAVENOUS | Status: AC
  Administered 2012-04-01: 2 g via INTRAVENOUS
  Filled 2012-04-01: qty 50

## 2012-04-01 MED ORDER — HEPARIN (PORCINE) IN NACL 2-0.9 UNIT/ML-% IJ SOLN
INTRAMUSCULAR | Status: AC
  Filled 2012-04-01: qty 1000

## 2012-04-01 MED ORDER — LOSARTAN POTASSIUM 50 MG PO TABS: 100.0000 mg | ORAL_TABLET | Freq: Every day | ORAL | Status: DC

## 2012-04-01 MED ORDER — POTASSIUM CHLORIDE CRYS ER 20 MEQ PO TBCR
20.0000 meq | EXTENDED_RELEASE_TABLET | ORAL | Status: DC
  Administered 2012-04-03: 20 meq via ORAL
  Filled 2012-04-01: qty 1

## 2012-04-01 MED ORDER — HEPARIN (PORCINE) IN NACL 100-0.45 UNIT/ML-% IJ SOLN
1150.0000 [IU]/h | INTRAMUSCULAR | Status: DC
  Administered 2012-04-01: 1200 [IU]/h via INTRAVENOUS
  Administered 2012-04-03: 1300 [IU]/h via INTRAVENOUS
  Administered 2012-04-05: 1150 [IU]/h via INTRAVENOUS
  Filled 2012-04-01 (×8): qty 250

## 2012-04-01 MED ORDER — ASPIRIN 81 MG PO CHEW
CHEWABLE_TABLET | ORAL | Status: AC
  Administered 2012-04-01: 324 mg
  Filled 2012-04-01: qty 4

## 2012-04-01 MED ORDER — SODIUM CHLORIDE 0.9 % IV SOLN: 1.0000 mL/kg/h | INTRAVENOUS | Status: AC

## 2012-04-01 MED ORDER — MIRTAZAPINE 15 MG PO TBDP
15.0000 mg | ORAL_TABLET | Freq: Every day | ORAL | Status: DC
  Filled 2012-04-01: qty 1

## 2012-04-01 MED ORDER — CLOPIDOGREL BISULFATE 75 MG PO TABS
75.0000 mg | ORAL_TABLET | Freq: Every day | ORAL | Status: DC
  Administered 2012-04-02 – 2012-04-07 (×6): 75 mg via ORAL
  Filled 2012-04-01 (×8): qty 1

## 2012-04-01 MED ORDER — METOPROLOL SUCCINATE ER 50 MG PO TB24: 50.0000 mg | ORAL_TABLET | Freq: Two times a day (BID) | ORAL | Status: DC

## 2012-04-01 MED ORDER — CEPHALEXIN 500 MG PO CAPS
500.0000 mg | ORAL_CAPSULE | Freq: Two times a day (BID) | ORAL | Status: DC
  Filled 2012-04-01: qty 1

## 2012-04-01 MED ORDER — ACETAMINOPHEN 325 MG PO TABS: 650.0000 mg | ORAL_TABLET | ORAL | Status: DC | PRN

## 2012-04-01 MED ORDER — GABAPENTIN 300 MG PO CAPS: 300.0000 mg | ORAL_CAPSULE | Freq: Two times a day (BID) | ORAL | Status: DC

## 2012-04-01 MED ORDER — FENTANYL CITRATE 0.05 MG/ML IJ SOLN
25.0000 ug | INTRAMUSCULAR | Status: DC | PRN
  Administered 2012-04-02 (×3): 25 ug via INTRAVENOUS
  Filled 2012-04-01 (×4): qty 2

## 2012-04-01 MED ORDER — LIDOCAINE HCL (PF) 1 % IJ SOLN
INTRAMUSCULAR | Status: AC
  Filled 2012-04-01: qty 30

## 2012-04-01 MED ORDER — TICAGRELOR 90 MG PO TABS
ORAL_TABLET | ORAL | Status: AC
  Filled 2012-04-01: qty 2

## 2012-04-01 MED ORDER — ONDANSETRON HCL 4 MG/2ML IJ SOLN: 4.0000 mg | Freq: Four times a day (QID) | INTRAMUSCULAR | Status: DC | PRN

## 2012-04-01 MED ORDER — LIDOCAINE IN D5W 4-5 MG/ML-% IV SOLN
1.0000 mg/min | INTRAVENOUS | Status: DC
  Administered 2012-04-01: 2 mg/min via INTRAVENOUS
  Filled 2012-04-01: qty 500

## 2012-04-01 NOTE — Progress Notes (Signed)
ANTICOAGULATION CONSULT NOTE - Follow Up Consult  Pharmacy Consult for heparin Indication: chest pain/ACS, afib  Allergies  Allergen Reactions  . Shellfish Allergy Anaphylaxis  . Codeine     Feels sick    Patient Measurements: Height: 5\' 3"  (160 cm) Weight: 184 lb 4.9 oz (83.6 kg) IBW/kg (Calculated) : 52.4 Heparin Dosing Weight: 71kg Vital Signs: Temp: 97.6 F (36.4 C) (03/15 1245) Temp src: Oral (03/15 1245) BP: 115/39 mmHg (03/15 1300) Pulse Rate: 66 (03/15 1300)  Labs:  Recent Labs  03/30/12 2116 03/31/12 0500 03/31/12 2041 04/01/12 0104 04/01/12 0108 04/01/12 0112  HGB 12.4  --   --   --  12.8  --   HCT 38.2  --   --   --  38.8  --   PLT 470*  --   --   --  445*  --   LABPROT 26.3* 25.4*  --   --   --  19.2*  INR 2.56* 2.44*  --   --   --  1.68*  HEPARINUNFRC  --   --  0.11*  --   --   --   CREATININE 1.42* 1.26*  --   --  1.35*  --   TROPONINI 1.11* 7.82*  --  2.73*  --   --     Estimated Creatinine Clearance: 39.2 ml/min (by C-G formula based on Cr of 1.35).  Assessment: 43 YOF with history of CAD s/p CABG and aortic valve replacement in 1991 and redo bypass in 2005 on chronic warfarin who was admitted for SOB. Some ST segment changes on admission, was not taken to cath emergently and plan was for cath Monday 3/17 for which heparin was started as INR was <2. Overnight 3/14 patient had episode of torsades and underwent a code and was taken to cath 3/15 AM- DES placed to left main extending into circumflex. Now to restart heparin at 2100 following that cath procedure. Of note, she was started 3/14 on heparin at 1200 units/hr and the first check was to be at 1000, but patient was undergoing procedure. Prior to the 1200 units/hr rate, patient's HL was subtherapeutic (0.11). H/H stable, platelets have been elevated all admission.  Goal of Therapy:  Heparin level 0.3-0.7 units/ml Monitor platelets by anticoagulation protocol: Yes   Plan:  1. Start heparin gtt at  1200units/hr at 2100 tonight 2. First HL check with AM labs 3. F/u long term anti-coag plans, s/s bleeding 4. Daily CBC and HL  Allante Whitmire D. Macaila Tahir, PharmD Clinical Pharmacist Pager: [phone] 04/01/2012 2:04 PM

## 2012-04-01 NOTE — Progress Notes (Signed)
[facility] Consultation Note [place] TEAM 1 - Stepdown/ICU TEAM   GERA INBODEN WUJ:[id number] DOB: [birth year] DOA: 03/30/2012 PCP: Londell Moh, MD  Brief narrative: 72 year old female patient with history of DM 2/IDDM, who is on a Lantus study for the last 1 year, HTN, HL intolerant of statins, CAD status post CABG and PCI, chronic systolic CHF presented to ED on 03/30/12 with acute onset of dyspnea. She claims compliance with medications but not sure of dietary compliance. At 8:30 PM last night, she started having worsening dyspnea but without any chest pain or palpitations. She presented to the ED where code STEMI was called which was canceled by cardiology. She was found to be in decompensated CHF. IV Lasix was started with good effect.   Since admission the patient indicated that she was feeling much better with significant improvement in her dyspnea. Her lab work showed potassium 5.7, creatinine 1.4 and blood glucose of 443 mg/dL. Patient claimed compliance to her Lantus with home CBGs range between 125-140 mg/dL. She endorsed that blood sugars less than 90 mg/dL cause symptomatic hypoglycemia. The hospitalist service was requested to assist in her diabetes management.   Assessment/Plan: Active Problems:   Acute pulmonary edema due to decompensated CHF/Cardiomyopathy, ischemic, EF 35-40% 2D Nov 2011 -per Cards    NSTEMI, secondary to demand ischemia, Troponin >7 /history of CAD, CABG 1991, redo 2006, last cath Dec 2011, medical Rx -per Cards    Type 2 diabetes mellitus, uncontrolled -now off Insulin gtt -she explains has recently been enrolled in a Lantus study thru her PCP ofc-  dose was 63 units daily and last HgbA1c one week ago was elevated at 11.1 -Lantus dose adjusted and SSI initiated - AM sugar controlled today- NPO for cath -I have advised that she should go home on NovoLog in addition to Lantus  Torsades/ Prolonged QT Being managed by Cardiology     Acute renal insufficiency, SCr 1.4 on admission/ Hyperkalemia -suspect 2/2 aggressive diuresis- per Cards -K+ now normal    HTN (hypertension) -controlled    Leukocytosis -stress/low volume likely etiology: UA negative, CXR c/w CHF only-also on chronic steroids    H/O mechanical aortic valve replacement, St Jude 1991/   Chronic anticoagulation with Coumadin  -per cards/pharmacy    Dyslipidemia   DVT prophylaxis: Low-dose IV heparin for anticoagulation Code Status: Full Family Communication: Patient Disposition Plan: At discretion of primary team/cardiology Isolation: None  Consultants:  [facility]  Procedures: 2-D echocardiogram (03/31/12) pending  Antibiotics: None  HPI/Subjective: No specific complaints- Tells me that she does not remember the events from last night   Objective: Blood pressure 122/33, pulse 77, temperature 98.3 F (36.8 C), temperature source Oral, resp. rate 17, height 5\' 3"  (1.6 m), weight 83.6 kg (184 lb 4.9 oz), SpO2 100.00%.  Intake/Output Summary (Last 24 hours) at 04/01/12 1133 Last data filed at 04/01/12 0910  Gross per 24 hour  Intake   1214 ml  Output   2150 ml  Net   -936 ml     Exam: General: No acute respiratory distress Lungs: Clear to auscultation bilaterally without wheezes or crackles, RA Cardiovascular: Regular rate and rhythm without murmur gallop or rub normal S1 and S2 Abdomen: Nontender, nondistended, soft, bowel sounds positive, no rebound, no ascites, no appreciable mass Extremities: No significant cyanosis, clubbing, or edema bilateral lower extremities  Data Reviewed: Basic Metabolic Panel:  Recent Labs Lab 03/30/12 2116 03/31/12 0500 04/01/12 0108  NA 136 140 133*  K 5.7* 4.1  3.9  CL 95* 99 92*  CO2 25 29 26   GLUCOSE 443* 158* 228*  BUN 37* 40* 39*  CREATININE 1.42* 1.26* 1.35*  CALCIUM 9.7 9.3 9.8  MG  --   --  2.2  PHOS  --   --  3.2   Liver Function Tests:  Recent Labs Lab  03/30/12 2116 03/31/12 0500  AST 45* 54*  ALT 23 22  ALKPHOS 54 48  BILITOT 0.6 0.5  PROT 7.2 6.5  ALBUMIN 3.7 3.4*   No results found for this basename: LIPASE, AMYLASE,  in the last 168 hours No results found for this basename: AMMONIA,  in the last 168 hours CBC:  Recent Labs Lab 03/30/12 2116 04/01/12 0108  WBC 14.9* 11.8*  HGB 12.4 12.8  HCT 38.2 38.8  MCV 103.0* 101.0*  PLT 470* 445*   Cardiac Enzymes:  Recent Labs Lab 03/30/12 2116 03/31/12 0500 04/01/12 0104  TROPONINI 1.11* 7.82* 2.73*   BNP (last 3 results)  Recent Labs  03/30/12 2116  PROBNP 2717.0*   CBG:  Recent Labs Lab 03/31/12 0801 03/31/12 1147 03/31/12 1620 03/31/12 1623 04/01/12 0817  GLUCAP 119* 169* 313* 306* 132*    Recent Results (from the past 240 hour(s))  MRSA PCR SCREENING     Status: None   Collection Time    03/31/12  1:50 AM      Result Value Range Status   MRSA by PCR NEGATIVE  NEGATIVE Final   Comment:            The GeneXpert MRSA Assay (FDA     approved for NASAL specimens     only), is one component of a     comprehensive MRSA colonization     surveillance program. It is not     intended to diagnose MRSA     infection nor to guide or     monitor treatment for     MRSA infections.     Studies:  Recent x-ray studies have been reviewed in detail by the Attending Physician  Scheduled Meds:  Reviewed in detail by the Attending Physician  Calvert Cantor, MD [phone] If 7PM-7AM, please contact night-coverage [email or web address] Password [facility] 04/01/2012, 11:33 AM   LOS: 2 days

## 2012-04-01 NOTE — Progress Notes (Signed)
Right femoral arterial sheath removed- manual pressure held for 20 minutes. Site is soft. Right pedal pulses palpable throughout procedure. VS monitored continuously throughout procedure. Pressure drsg applied. Pt. Instructed to keep HOB <= 30 degrees and to  notified RN with any CP, SOB, bleeding at site or questions. Pt. Verbalized understanding of questions.

## 2012-04-01 NOTE — Progress Notes (Signed)
Patient unable to use bedpan. Bladder scan 486 ml. Charmian Muff NP [facility]) contacted and ordered received for foley.

## 2012-04-01 NOTE — CV Procedure (Signed)
PROCEDURE:  PCI Left Main  INDICATIONS:  Cardiac arrest, NSTEMI  The risks, benefits, and details of the procedure were explained to the patient.  The patient verbalized understanding and wanted to proceed.  Informed written consent was obtained.  PROCEDURE TECHNIQUE:  The diagnostic catheterization was done by Dr. Royann Shivers which revealed a patent LIMA to LAD, patent SVG to RCA.  There was significant proximal left main disease.  There is some flow into the LAD system but there is a severe ostial stenosis of the LAD.  Based on the patient's presentation, we decided to pursue angioplasty of the left main extending into the circumflex.   CONTRAST:  Total of 100 cc.  COMPLICATIONS:  None.        ANGIOGRAPHIC DATA:   The left main coronary artery has a proximal 90% stenosis.  The disease extends into the proximal left circumflex.  The remainder of the circumflex has mild to moderate diffuse atherosclerosis.  There is a small OM 1 and OM 2.  The OM 3 is medium size.  There is mild proximal disease.    PCI NARRATIVE:  A CLS 3.5 guiding catheter with sideholes was used to engage the left main.  A pro-water wire was placed across the area of disease in the circumflex system.  A 2.5 x 15 balloon was used to predilate the entire area disease in the left main and proximal circumflex.  A 2.75 x 18 expedition drug-eluting stent was advanced to the area of disease but would not cross the bend at the ostial circumflex.  A BMW wire was advanced into the circumflex.  The stent again was advanced but would not cross the entire area disease.  The pro-water wire tip was quite deformed.  The pro-water was removed.  A guide liner was placed at the tip of the guide.  A 2.5 x 15 balloon was advanced back into the circumflex.  This was inflated to 6 atmospheres and used as an anchor to advance the guide liner.  The guide liner was advanced across the bend in the proximal circumflex.  The 2.75 x 18 drug eluting stent was  then successfully advanced across the area of disease.  The guidewire was removed.  The stent was deployed.  The stent was postdilated with a 3.25 x 15 noncompliant balloon.  There is an excellent angiographic result.  There is no residual stenosis.  Flow was maintained into the LAD system as well.  IMPRESSIONS:  1. 90% left main coronary artery stenosis.  Successful drug eluting stent placement to the left main extending into the circumflex with a 2.75 x 18 expedition drug-eluting stent, postdilated to greater than 3.3 mm in diameter.    RECOMMENDATION:  Dual antiplatelet therapy for now.  Will need coumadin restarted for her prosthetic aortic valve.  Further management and followup per Dr. Allyson Sabal.  She may need just Plavix and Coumadin together without aspirin due to bleeding risk.

## 2012-04-01 NOTE — Progress Notes (Addendum)
Just before the angioplasty, there was a malfunction in cath lab 6.  We had not yet placed the guide catheter.  Angiomax was started and put on hold.  We moved the patient to a different cath lab.  We will change out the sheath and give antibiotics at the end of the procedure.  We will proceed with the intervention on the protected left main, as the large first OM is not protected.

## 2012-04-01 NOTE — Progress Notes (Signed)
0Patient awake, alert. Oriented X 3.  Denies Chest pain.  Thanked nurse for doing CPR.  Stat labs drawn.  Prepare to transfer to 2904-CCU.  Carles Collet NP notified of all parameters. 0144:  Repeat 12 lead as ST depression look different on monitor. 0155:  L. Stone paged.  Dr. Jennell Corner in route to see pt. 0200:  Report to Delice Bison on CCU.  Transferred via bed to 2904.  Assessment stable.

## 2012-04-01 NOTE — Progress Notes (Signed)
CODE BLUE NOTE  Patient Name: [name]   MRN: [id number]   Date of Birth/ Sex: 1940-02-01 , female      Admission Date: 03/30/2012  Attending Provider: Runell Gess, MD  Primary Diagnosis: Acute pulmonary edema  Responding Physicians: Elyse Jarvis, MD, Denton Ar, MD  Pt admitted for NSTEMI and acute pulmonary edema was found by nursing staff to be unresponsive and not breathing, no pulse. Code blue was subsequently called. One round of CPR was administered and patient underwent one shock via defibrillator after which she responded verbally saying that it hurt.  At the time of MD arrival on scene, patient had a pulse and was responsive. She was in NSR on the monitor and BP was 180s/60s. STAT EKG was obtained showed no abnormal rhythm, no major changes from previous.  STAT BMP, Mag, Phos, Troponins were drawn and are pending.  Discussed with primary team over the phone and they will arrange transfer to ICU and follow up on labs.   Larey Seat, MD  04/01/2012, 1:23 AM

## 2012-04-01 NOTE — Progress Notes (Addendum)
The [facility] and Vascular Center  Subjective: She is sore from CPR. Denies CP and SOB.  Objective: Vital signs in last 24 hours: Temp:  [97.4 F (36.3 C)-98.7 F (37.1 C)] 97.4 F (36.3 C) (03/15 0300) Pulse Rate:  [62-80] 69 (03/15 0700) Resp:  [12-17] 14 (03/15 0700) BP: (94-205)/(34-71) 130/54 mmHg (03/15 0700) SpO2:  [95 %-100 %] 100 % (03/15 0700) Weight:  [184 lb 4.9 oz (83.6 kg)] 184 lb 4.9 oz (83.6 kg) (03/15 0700)    Intake/Output from previous day: 03/14 0701 - 03/15 0700 In: 1345 [P.O.:840; I.V.:505] Out: 3926 [Urine:3925; Stool:1] Intake/Output this shift:    Medications Current Facility-Administered Medications  Medication Dose Route Frequency Provider Last Rate Last Dose  . 0.9 %  sodium chloride infusion   Intravenous Continuous Elease Etienne, MD      . acetaminophen (TYLENOL) tablet 650 mg  650 mg Oral Q4H PRN Abelino Derrick, PA-C      . ALPRAZolam Prudy Feeler) tablet 0.25 mg  0.25 mg Oral BID PRN Abelino Derrick, PA-C      . aspirin chewable tablet 81 mg  81 mg Oral Daily Runell Gess, MD   81 mg at 03/31/12 0944  . calcium carbonate (OS-CAL - dosed in mg of elemental calcium) tablet 500 mg of elemental calcium  1 tablet Oral BID WC Eda Paschal Kilroy, PA-C   500 mg of elemental calcium at 03/31/12 2030  . dextrose 50 % solution 25 mL  25 mL Intravenous PRN Eda Paschal Kilroy, PA-C      . dextrose 50 % solution 25 mL  25 mL Intravenous PRN Elease Etienne, MD      . DULoxetine (CYMBALTA) DR capsule 60 mg  60 mg Oral Daily Abelino Derrick, PA-C   60 mg at 03/31/12 0944  . fenofibrate tablet 54 mg  54 mg Oral Daily Abelino Derrick, PA-C   54 mg at 03/31/12 1820  . furosemide (LASIX) injection 40 mg  40 mg Intravenous BID Abelino Derrick, PA-C   40 mg at 03/31/12 1820  . gabapentin (NEURONTIN) capsule 300 mg  300 mg Oral BID Brittainy Simmons, PA-C   300 mg at 03/31/12 2210  . gabapentin (NEURONTIN) capsule 300 mg  300 mg Oral Daily PRN Brittainy Simmons, PA-C      .  heparin ADULT infusion 100 units/mL (25000 units/250 mL)  1,200 Units/hr Intravenous Continuous Colleen Can, RPH 12 mL/hr at 04/01/12 0230 1,200 Units/hr at 04/01/12 0230  . HYDROcodone-acetaminophen (NORCO) 7.5-325 MG per tablet 1 tablet  1 tablet Oral Q6H PRN Abelino Derrick, PA-C   1 tablet at 03/31/12 2339  . insulin aspart (novoLOG) injection 0-15 Units  0-15 Units Subcutaneous TID WC Russella Dar, NP   11 Units at 03/31/12 1630  . insulin aspart (novoLOG) injection 0-5 Units  0-5 Units Subcutaneous QHS Russella Dar, NP   2 Units at 03/31/12 2208  . insulin glargine (LANTUS) injection 63 Units  63 Units Subcutaneous Daily Russella Dar, NP      . lidocaine (cardiac) IV  infusion 4 mg/mL  1-4 mg/min Intravenous Titrated Laterrica Libman, MD 30 mL/hr at 04/01/12 0240 2 mg/min at 04/01/12 0240  . losartan (COZAAR) tablet 100 mg  100 mg Oral Daily Brittainy Simmons, PA-C   100 mg at 03/31/12 1246  . metoprolol succinate (TOPROL-XL) 24 hr tablet 50 mg  50 mg Oral BID Brittainy Simmons, PA-C   50 mg at 03/31/12  2210  . multivitamin with minerals tablet 1 tablet  1 tablet Oral Daily Abelino Derrick, PA-C   1 tablet at 03/31/12 0944  . mupirocin ointment (BACTROBAN) 2 %   Topical BID Eda Paschal Kilroy, PA-C      . nitroGLYCERIN (NITROSTAT) SL tablet 0.4 mg  0.4 mg Sublingual Q5 Min x 3 PRN Eda Paschal Kilroy, PA-C      . nitroGLYCERIN 0.2 mg/mL in dextrose 5 % infusion  5 mcg/min Intravenous Titrated Luke K Kilroy, PA-C      . ondansetron [facility]) injection 4 mg  4 mg Intravenous Q6H PRN Eda Paschal Kilroy, PA-C      . pantoprazole (PROTONIX) EC tablet 40 mg  40 mg Oral Daily Abelino Derrick, PA-C   40 mg at 03/31/12 0951  . predniSONE (DELTASONE) tablet 10 mg  10 mg Oral Q breakfast Brittainy Simmons, PA-C   10 mg at 03/31/12 1247  . temazepam (RESTORIL) capsule 30 mg  30 mg Oral QHS PRN Abelino Derrick, PA-C      . vitamin B-12 (CYANOCOBALAMIN) tablet 1,000 mcg  1,000 mcg Oral BID Abelino Derrick, PA-C   1,000  mcg at 03/31/12 2210    PE: General appearance: alert, cooperative and no distress Lungs: clear to auscultation bilaterally Heart: regular rate and rhythm and crisp valve signs Extremities: no LEE Pulses: 2+ and symmetric Skin: warm and dry Neurologic: Grossly normal  Lab Results:   Recent Labs  03/30/12 2116 04/01/12 0108  WBC 14.9* 11.8*  HGB 12.4 12.8  HCT 38.2 38.8  PLT 470* 445*   BMET  Recent Labs  03/30/12 2116 03/31/12 0500 04/01/12 0108  NA 136 140 133*  K 5.7* 4.1 3.9  CL 95* 99 92*  CO2 25 29 26   GLUCOSE 443* 158* 228*  BUN 37* 40* 39*  CREATININE 1.42* 1.26* 1.35*  CALCIUM 9.7 9.3 9.8   PT/INR  Recent Labs  03/30/12 2116 03/31/12 0500 04/01/12 0112  LABPROT 26.3* 25.4* 19.2*  INR 2.56* 2.44* 1.68*   Cardiac Enzymes Cardiac Panel (last 3 results)  Recent Labs  03/30/12 2116 03/31/12 0500 04/01/12 0104  TROPONINI 1.11* 7.82* 2.73*   Assessment/Plan  Principal Problem:   Acute pulmonary edema Active Problems:   CAD, CABG 1991, redo 2006, last cath Dec 2011, medical Rx   Acute renal insufficiency, SCr 1.4 on admission   H/O mechanical aortic valve replacement, St Jude 1991   Chronic anticoagulation with Coumadin   Dyslipidemia   HTN (hypertension)   Cardiomyopathy, ischemic, EF 35-40% 2D Nov 2011   NSTEMI, secondary to demand ischemia, Troponin >7   Type 2 diabetes mellitus, uncontrolled   Hyperkalemia   Leukocytosis  Plan:   Pt had an abrupt onset of torsades overnight, requiring CPR and a single defibrillator shock. She converted to NSR. She is on IV lidocaine. All QT prolonging drugs have been discharged. She is currently in NSR. K+ is 3.9. Mg is 2.2.  INR today is 1.68. Plan for LHC today. MD to see.    LOS: 2 days    Brittainy M. Delmer Islam 04/01/2012 7:56 AM  I have seen and examined the patient along with Brittainy M. Sharol Harness, PA-C.  I have reviewed the chart, notes and new data.  I agree with PA's note.  Key new  complaints: no angina, no further serious arrhythmia Key examination changes: no clinical signs of CHF; rare PVCs Key new findings / data: very long QTc(503 ms) despite normal electrolytes. INR  last night was 1.68.  PLAN: From an anticoagulation standpoint, coronary angiography is now safe. I do not think it is wise to wait another 48 hours for elective cath on Monday, considering how unstable she has been from an electrical standpoint. Both the ECG and echo suggest extensive areas of ischemia, well out of proportion with the relatively minor increase in cardiac enzymes.   We discussed the risks and potential benefits of cardiac cath, diagnostic coronary angio and possible PCI. Discussed the situation with Dr. Eldridge Dace, who will be available if urgent PCI is indicated.   Also reviewed her DNR status. She has an advanced directive, but specifically indicated that this refers to her desire to never "be a vegetable on a ventilator" and that she is not opposed to mechanical ventilation and resuscitation for a brief, reversible illness.Her daughter, [name], was on the phone with [place] while we were reviewing the patient's wishes.  This procedure has been fully reviewed with the patient and written informed consent has been obtained.  Thurmon Fair, MD, [facility] [phone] 04/01/2012, 8:13 AM   Today, more than 60 minutes have been spent in evaluation and treatment of critical illness and in lengthy and complex decision making. Thurmon Fair, MD

## 2012-04-01 NOTE — Progress Notes (Signed)
ANTICOAGULATION CONSULT NOTE - Follow Up Consult  Pharmacy Consult for heparin Indication: atrial fibrillation and NSTEMI  Labs:  Recent Labs  03/30/12 2116 03/31/12 0500 03/31/12 2041 04/01/12 0104 04/01/12 0108 04/01/12 0112  HGB 12.4  --   --   --  12.8  --   HCT 38.2  --   --   --  38.8  --   PLT 470*  --   --   --  445*  --   LABPROT 26.3* 25.4*  --   --   --  19.2*  INR 2.56* 2.44*  --   --   --  1.68*  HEPARINUNFRC  --   --  0.11*  --   --   --   CREATININE 1.42* 1.26*  --   --  1.35*  --   TROPONINI 1.11* 7.82*  --  2.73*  --   --     Assessment: 72yo female had been on heparin yesterday am but subsequently held for INR >2, now s/p code requiring shock, INR 1.68, to resume heparin; level was subtherapeutic with heparin gtt at 900 units/hr.  Goal of Therapy:  Heparin level 0.3-0.7 units/ml   Plan:  Will begin heparin gtt at 1200 units/hr and check level in 8hr.  Vernard Gambles, PharmD, BCPS  04/01/2012,2:23 AM

## 2012-04-01 NOTE — Progress Notes (Signed)
Patient in torsades.  Unresponsive.  Does not arouse to voice or shake.  CPR initiated.  Code Blue called.  Please see Code Blue sheet.

## 2012-04-01 NOTE — Progress Notes (Signed)
Chaplain Note:  Chaplain responded to Code STEMI page, relieving previous chaplain on call.  Pt was resting in bed in CICU room, awake, alert, and oriented.  Family was at bedside.  Chaplain provided spiritual comfort support and prayer for pt and family.  Chaplain continued to support pt, family, and cath lab staff throughout the pt's cath procedure, acting as liaison between the lab and the family.  Chaplain supported pt's physicians as they explained procedure results to pt's family.  Pt, family, and cath lab staff expressed appreciation for chaplain support.  Chaplain will follow up as needed.  04/01/12 0830  Clinical Encounter Type  Visited With Patient;Family;Health care provider  Visit Type Spiritual support (Code STEMI)  Referral From Chaplain  Spiritual Encounters  Spiritual Needs Emotional;Prayer  Stress Factors  Patient Stress Factors Health changes;Major life changes  Family Stress Factors Major life changes  Verdie Shire, [place] [phone]

## 2012-04-01 NOTE — Progress Notes (Signed)
Abrupt onset of torsades de pointes occurred in setting of severely prolonged QT interval and frequent PVCs  with "R-on-T" phenomenon. Required CPR and a single defibrillator shock with conversion to NSR. No respiratory or neurological complaints. Normal electrolytes. Troponin trending down. New wall motion abnormality and depressed LV EF on echo. Scheduled for heart cath/coronary angio with delay due to warfarin anticoagulation. Stop all potentially QT prolonging drugs (i.e. duloxetine, mirtazapine). Start lidocaine IV. May consider early angio tomorrow, rather than Monday.

## 2012-04-01 NOTE — CV Procedure (Signed)
CARDIAC CATHETERIZATION REPORT   Procedures performed:  1. Left heart catheterization  2. Selective coronary angiography  3. Selective angiography of saphenous vein graft bypasses and left internal mammary artery bypass  Reason for procedure:  Polymorphic ventricular tachycardia (torsades de pointes)  Acute non ST segment elevation myocardial infarction   Procedure performed by: Thurmon Fair, MD, [facility]  Complications: none   Estimated blood loss: less than 5 mL   History:  72 year old woman presents with non-ST segment elevation myocardial infarction, worsening left ventricular systolic function, complicated by an episode of polymorphic ventricular tachycardia classic torsade de pointes in the setting of prolonged QT interval. History of coronary artery bypass surgery & redo coronary bypass surgery and aortic valve replacement with a mechanical prosthesis  Consent: The risks, benefits, and details of the procedure were explained to the patient. Risks including death, MI, stroke, bleeding, limb ischemia, renal failure and allergy were described and accepted by the patient. Informed written consent was obtained prior to proceeding.  Technique: The patient was brought to the cardiac catheterization laboratory in the fasting state. He was prepped and draped in the usual sterile fashion. Local anesthesia with 1% lidocaine was administered to the right groin area. Using the modified Seldinger technique a 5 French right common femoral artery sheath was introduced without difficulty. Under fluoroscopic guidance, using 6 [place] JL4 and JR  catheters, selective cannulation of the left coronary artery, right coronary artery, saphenous vein graft to the right coronary artery, saphenous vein graft to the distal oblique marginal artery, stump of a third saphenous vein graft and left internal mammary artery bypass to the LAD artery were respectively performed. The mechanical aortic valve was not crossed.  Several coronary angiograms in a variety of projections were recorded. No immediate complications occurred. At the end of the procedure, all catheters were removed. The diagnostic procedure as needed for bifurcates in its revascularization performed by Dr. Eldridge Dace.  Contrast used: 75 mL Omnipaque  Angiographic Findings:  1. The left main coronary artery is severely and diffusely diseased with at least 90% stenosis. It appears moderately calcified. The primary runoff on the left main coronary artery is the left circumflex coronary artery and its first oblique marginal branch. The LAD is virtually occluded except for some tiny proximal diagonal branches.  2. The left anterior descending artery is proximally occluded and fills via the LIMA bypass. It is a diffusely diseased vessel that bifurcates before reaches the apex and generates  There is evidence of extensive luminal irregularities and mild calcification.  3. The left circumflex coronary artery is a medium to large-size vessel non- dominant vessel that generates 2 major oblique marginal arteries. There is evidence of extensive luminal irregularities and moderate calcification. Just beyond the ostium of the first oblique marginal vessel the circumflex coronary artery is totally occluded. There appears to be a 75-80% ostial stenosis of the first oblique marginal artery. The second oblique marginal artery fills via a saphenous vein graft bypass. 4. The right coronary artery is a large-size dominant vessel that generates a medium size bifurcating posterior lateral ventricular system as well as a large posterior descending artery. There is evidence of extensive luminal irregularities and moderate calcification. Several hemodynamically meaningful stenoses are seen, including a 60-70% proximal and a 70-80% distal lesion upstream of the bifurcation. There is competitive flow in the posterior descending artery from the saphenous vein graft bypass  5. The  saphenous vein graft bypass to the right coronary artery is widely patent and healthy without visible  irregularities and provides excellent flow to the posterior descending artery as well as retrograde flow to the right coronary artery and its posterior lateral ventricular branch  6. The saphenous vein graft bypass to the second oblique marginal artery is also widely patent and shows minimal luminal irregularities provide excellent flow to the second oblique marginal artery. Although there is diffuse disease in the oblique marginal vessel no significant stenoses are seen  7. The left internal mammary artery bypass to the left anterior descending artery is widely patent and healthy without visible stenoses. As mentioned the distal LAD artery is diffusely diseased    IMPRESSIONS:  New development of high-grade stenosis of the proximal LAD artery with subsequent ischemia in the distribution of the first oblique marginal artery appears to be the cause of the patient's acute coronary event.  RECOMMENDATION:  Attempt percutaneous revascularization and stenting of the left main coronary artery and if necessary of the ostial lesion of the first oblique marginal artery.      Thurmon Fair, MD, [facility] [phone] office [phone] pager 04/01/2012 10:03 AM

## 2012-04-02 DIAGNOSIS — E785 Hyperlipidemia, unspecified: Secondary | ICD-10-CM

## 2012-04-02 LAB — CBC
HCT: 32.1 % — ABNORMAL LOW (ref 36.0–46.0)
Hemoglobin: 10.6 g/dL — ABNORMAL LOW (ref 12.0–15.0)
MCH: 33.4 pg (ref 26.0–34.0)
MCHC: 33 g/dL (ref 30.0–36.0)
MCV: 101.3 fL — ABNORMAL HIGH (ref 78.0–100.0)
Platelets: 375 10*3/uL (ref 150–400)
RBC: 3.17 MIL/uL — ABNORMAL LOW (ref 3.87–5.11)
RDW: 14.2 % (ref 11.5–15.5)
WBC: 10.4 10*3/uL (ref 4.0–10.5)

## 2012-04-02 LAB — HEPARIN LEVEL (UNFRACTIONATED)
Heparin Unfractionated: 0.38 IU/mL (ref 0.30–0.70)
Heparin Unfractionated: 0.33 IU/mL (ref 0.30–0.70)

## 2012-04-02 LAB — GLUCOSE, CAPILLARY
Glucose-Capillary: 172 mg/dL — ABNORMAL HIGH (ref 70–99)
Glucose-Capillary: 254 mg/dL — ABNORMAL HIGH (ref 70–99)

## 2012-04-02 LAB — BASIC METABOLIC PANEL
GFR calc non Af Amer: 39 mL/min — ABNORMAL LOW (ref >90)
Glucose, Bld: 236 mg/dL — ABNORMAL HIGH (ref 70–99)
Potassium: 4.2 mEq/L (ref 3.5–5.1)
Sodium: 136 mEq/L (ref 135–145)

## 2012-04-02 MED ORDER — WARFARIN SODIUM 5 MG PO TABS
5.0000 mg | ORAL_TABLET | Freq: Once | ORAL | Status: AC
  Administered 2012-04-02: 5 mg via ORAL
  Filled 2012-04-02: qty 1

## 2012-04-02 MED ORDER — HYDROCODONE-ACETAMINOPHEN 7.5-325 MG PO TABS
1.0000 | ORAL_TABLET | ORAL | Status: DC | PRN
Start: 2012-04-02 — End: 2012-04-07
  Administered 2012-04-02 – 2012-04-07 (×21): 1 via ORAL
  Filled 2012-04-02 (×23): qty 1

## 2012-04-02 MED ORDER — WARFARIN - PHARMACIST DOSING INPATIENT: Freq: Every day | Status: DC

## 2012-04-02 MED FILL — Medication: Qty: 1 | Status: AC

## 2012-04-02 NOTE — Progress Notes (Signed)
ANTICOAGULATION CONSULT NOTE - Follow Up Consult  Pharmacy Consult for heparin Indication: chest pain/ACS and atrial fibrillation  Labs:  Recent Labs  03/31/12 0500  04/01/12 0104 04/01/12 0108 04/01/12 0112 04/02/12 0525 04/02/12 1232 04/02/12 2231  HGB  --   --   --  12.8  --  10.6*  --   --   HCT  --   --   --  38.8  --  32.1*  --   --   PLT  --   --   --  445*  --  375  --   --   LABPROT 25.4*  --   --   --  19.2*  --   --   --   INR 2.44*  --   --   --  1.68*  --   --   --   HEPARINUNFRC  --   < >  --   --   --  0.38 0.26* 0.33  CREATININE 1.26*  --   --  1.35*  --  1.34*  --   --   TROPONINI 7.82*  --  2.73*  --   --   --   --   --   < > = values in this interval not displayed.   Assessment/Plan:  72yo female now therapeutic on heparin after rate increase.  Will continue gtt at current rate and confirm stable with am labs.  Vernard Gambles, PharmD, BCPS  04/02/2012,11:06 PM

## 2012-04-02 NOTE — Progress Notes (Signed)
The [facility] and Vascular Center  Subjective: Feels great. Denies CP and SOB. Continues to have chest wall soreness from CPR. She has asked for the frequency of her pain medication to be increased to Q4Hr. She is ready to get out of bed.   Objective: Vital signs in last 24 hours: Temp:  [97.4 F (36.3 C)-98.1 F (36.7 C)] 97.8 F (36.6 C) (03/16 0007) Pulse Rate:  [63-110] 63 (03/16 0600) Resp:  [11-20] 11 (03/16 0600) BP: (105-151)/(29-82) 113/41 mmHg (03/16 0600) SpO2:  [88 %-100 %] 99 % (03/16 0600) Weight:  [184 lb 8.4 oz (83.7 kg)] 184 lb 8.4 oz (83.7 kg) (03/16 0500)    Intake/Output from previous day: 03/15 0701 - 03/16 0700 In: 1672 [P.O.:540; I.V.:1082; IV Piggyback:50] Out: 2950 [Urine:2950] Intake/Output this shift:    Medications Current Facility-Administered Medications  Medication Dose Route Frequency Provider Last Rate Last Dose  . 0.9 %  sodium chloride infusion   Intravenous Continuous Elease Etienne, MD 10 mL/hr at 04/02/12 0600    . acetaminophen (TYLENOL) tablet 650 mg  650 mg Oral Q4H PRN Abelino Derrick, PA-C      . ALPRAZolam Prudy Feeler) tablet 0.25 mg  0.25 mg Oral BID PRN Abelino Derrick, PA-C      . aspirin chewable tablet 81 mg  81 mg Oral Daily Runell Gess, MD   81 mg at 03/31/12 0944  . calcium carbonate (OS-CAL - dosed in mg of elemental calcium) tablet 500 mg of elemental calcium  1 tablet Oral BID WC Eda Paschal Kilroy, PA-C   500 mg of elemental calcium at 04/01/12 1740  . clopidogrel (PLAVIX) tablet 75 mg  75 mg Oral Q breakfast Corky Crafts, MD      . dextrose 50 % solution 25 mL  25 mL Intravenous PRN Eda Paschal Kilroy, PA-C      . dextrose 50 % solution 25 mL  25 mL Intravenous PRN Elease Etienne, MD      . fenofibrate tablet 54 mg  54 mg Oral Daily Luke K Kilroy, PA-C   54 mg at 04/01/12 1740  . fentaNYL (SUBLIMAZE) injection 25 mcg  25 mcg Intravenous Q1H PRN Thurmon Fair, MD   25 mcg at 04/02/12 0443  . gabapentin (NEURONTIN)  capsule 300 mg  300 mg Oral BID Brittainy Simmons, PA-C   300 mg at 04/01/12 2230  . gabapentin (NEURONTIN) capsule 300 mg  300 mg Oral Daily PRN Brittainy Simmons, PA-C      . heparin ADULT infusion 100 units/mL (25000 units/250 mL)  1,200 Units/hr Intravenous Continuous Corky Crafts, MD 12 mL/hr at 04/01/12 2100 1,200 Units/hr at 04/01/12 2100  . HYDROcodone-acetaminophen (NORCO) 7.5-325 MG per tablet 1 tablet  1 tablet Oral Q4H PRN Robbie Lis, PA-C   1 tablet at 04/02/12 0752  . insulin aspart (novoLOG) injection 0-15 Units  0-15 Units Subcutaneous TID WC Russella Dar, NP   8 Units at 04/01/12 1740  . insulin aspart (novoLOG) injection 0-5 Units  0-5 Units Subcutaneous QHS Russella Dar, NP   3 Units at 04/01/12 2218  . insulin glargine (LANTUS) injection 64 Units  64 Units Subcutaneous Q breakfast Corky Crafts, MD      . lidocaine (cardiac) IV  infusion 4 mg/mL  1-4 mg/min Intravenous Titrated Runell Gess, MD 30 mL/hr at 04/01/12 1850 2 mg/min at 04/01/12 1850  . losartan (COZAAR) tablet 100 mg  100 mg Oral Daily Robbie Lis, PA-C  100 mg at 03/31/12 1246  . metoprolol succinate (TOPROL-XL) 24 hr tablet 50 mg  50 mg Oral BID Brittainy Simmons, PA-C   50 mg at 04/01/12 2230  . multivitamin with minerals tablet 1 tablet  1 tablet Oral Daily Abelino Derrick, PA-C   1 tablet at 04/01/12 1739  . mupirocin ointment (BACTROBAN) 2 %   Topical BID Eda Paschal Kilroy, PA-C      . nitroGLYCERIN (NITROSTAT) SL tablet 0.4 mg  0.4 mg Sublingual Q5 Min x 3 PRN Eda Paschal Kilroy, PA-C      . nitroGLYCERIN 0.2 mg/mL in dextrose 5 % infusion  5 mcg/min Intravenous Titrated Luke K Kilroy, PA-C      . ondansetron [facility]) injection 4 mg  4 mg Intravenous Q6H PRN Abelino Derrick, PA-C   4 mg at 04/01/12 1412  . pantoprazole (PROTONIX) EC tablet 40 mg  40 mg Oral Daily Abelino Derrick, PA-C   40 mg at 04/01/12 1652  . [START ON 04/03/2012] potassium chloride SA (K-DUR,KLOR-CON) CR tablet 20 mEq   20 mEq Oral Q M,W,F Corky Crafts, MD      . predniSONE (DELTASONE) tablet 10 mg  10 mg Oral Daily Corky Crafts, MD   10 mg at 04/01/12 1309  . temazepam (RESTORIL) capsule 30 mg  30 mg Oral QHS PRN Abelino Derrick, PA-C   30 mg at 04/01/12 2301  . vitamin B-12 (CYANOCOBALAMIN) tablet 1,000 mcg  1,000 mcg Oral BID Abelino Derrick, PA-C   1,000 mcg at 04/01/12 2230    PE: General appearance: alert, cooperative and no distress Lungs: clear to auscultation bilaterally Heart: regularly irregular rhythm and crisp heart sounds Abdomen: soft, non-tender; bowel sounds normal; no masses,  no organomegaly Extremities: no LEE; Right Groin: stable, no hematoma, non-tender, no bruit Pulses: 2+ and symmetric Skin: warm and dry Neurologic: Grossly normal  Lab Results:   Recent Labs  03/30/12 2116 04/01/12 0108 04/02/12 0525  WBC 14.9* 11.8* 10.4  HGB 12.4 12.8 10.6*  HCT 38.2 38.8 32.1*  PLT 470* 445* 375   BMET  Recent Labs  03/31/12 0500 04/01/12 0108 04/02/12 0525  NA 140 133* 136  K 4.1 3.9 4.2  CL 99 92* 96  CO2 29 26 28   GLUCOSE 158* 228* 236*  BUN 40* 39* 34*  CREATININE 1.26* 1.35* 1.34*  CALCIUM 9.3 9.8 8.5   PT/INR  Recent Labs  03/30/12 2116 03/31/12 0500 04/01/12 0112  LABPROT 26.3* 25.4* 19.2*  INR 2.56* 2.44* 1.68*    Cardiac Panel (last 3 results)  Recent Labs  03/30/12 2116 03/31/12 0500 04/01/12 0104  TROPONINI 1.11* 7.82* 2.73*    Assessment/Plan  Principal Problem:   NSTEMI, secondary to demand ischemia, Troponin >7 Active Problems:   CAD, CABG 1991, redo 2006, S/P DES to Left Main 04/01/12   Acute renal insufficiency, SCr 1.4 on admission   H/O mechanical aortic valve replacement, St Jude 1991   Chronic anticoagulation with Coumadin   Dyslipidemia   HTN (hypertension)   Cardiomyopathy, ischemic, EF 35-40% 2D Nov 2011   Type 2 diabetes mellitus, uncontrolled   Hyperkalemia   Leukocytosis   Acute pulmonary edema  Plan:  S/P LHC yesterday by Dr. Eldridge Dace. The cath revealed 90% stenosis of the proximal left main coronary artery. S/P PCI and stenting of left main w/ a DES. She is CP free.  She is currently on ASA + Plavix. However, she will need to resume Coumadin for mechanical  aortic valve. May consider dual antiplatelet therapy with Coumadin + Plavix and D/C ASA to reduce bleeding risk.  Will defer to cardiologist. She remains on heparin for now. Maintaining NSR on telemetry. No further arrhythmias. HR and BP stable. Cr is stable at 1.34 (1.35 yesterday). The left groin is stable. H/H is 10.6/32.1. Plan to get out of bed and in chair today. Will continue to monitor. MD to follow with further recommendation.    LOS: 3 days    Brittainy M. Sharol Harness, PA-C 04/02/2012 8:04 AM  I have seen and examined the patient along with Brittainy M. Sharol Harness, PA-C.  I have reviewed the chart, notes and new data.  I agree with PA's note.  Key new complaints: rib cage soreness - suspect fractures after CPR Key examination changes: no signs of CHF, no ventricular arrhythmia, crisp prosthetic valve clicks. Key new findings / data: Stable renal function; QT much shorter, around 450 ms.  PLAN: Resume warfarin. DC IV heparin when INR >2. Continue ASA+ Warfarin+Clopidogrel for 1 month, then warfarin+clopidogrel for 1 year for her DES. DC Lidocaine. If no arrhythmia, transfer to stepdown this PM.  Thurmon Fair, MD, [facility] [phone] 04/02/2012, 9:14 AM

## 2012-04-02 NOTE — Progress Notes (Signed)
ANTICOAGULATION CONSULT NOTE - Follow Up Consult  Pharmacy Consult for heparin and warfarin Indication: chest pain/ACS, afib  Allergies  Allergen Reactions  . Shellfish Allergy Anaphylaxis  . Codeine     Feels sick    Patient Measurements: Height: 5\' 3"  (160 cm) Weight: 184 lb 8.4 oz (83.7 kg) IBW/kg (Calculated) : 52.4 Heparin Dosing Weight: 71kg  Vital Signs: Temp: 97.5 F (36.4 C) (03/16 1200) Temp src: Oral (03/16 1200) BP: 113/32 mmHg (03/16 1200) Pulse Rate: 69 (03/16 0900)  Labs:  Recent Labs  03/30/12 2116 03/31/12 0500 03/31/12 2041 04/01/12 0104 04/01/12 0108 04/01/12 0112 04/02/12 0525 04/02/12 1232  HGB 12.4  --   --   --  12.8  --  10.6*  --   HCT 38.2  --   --   --  38.8  --  32.1*  --   PLT 470*  --   --   --  445*  --  375  --   LABPROT 26.3* 25.4*  --   --   --  19.2*  --   --   INR 2.56* 2.44*  --   --   --  1.68*  --   --   HEPARINUNFRC  --   --  0.11*  --   --   --  0.38 0.26*  CREATININE 1.42* 1.26*  --   --  1.35*  --  1.34*  --   TROPONINI 1.11* 7.82*  --  2.73*  --   --   --   --     Estimated Creatinine Clearance: 39.5 ml/min (by C-G formula based on Cr of 1.34).  Assessment: 27 YOF with history of CAD s/p CABG and aortic valve replacement in 1991 and redo bypass in 2005 on chronic warfarin who was admitted for SOB. Had a torsade code 3/14 and was taken to cath 3/15- DES placed to left main extending into circumflex. Restarted heparin post-cath and first level therapeutic at 0.38. 8h confirmation level subtherapeutic, 0.26.  CBC is stable, platelets are normal (were elevated) and no bleeding noted. Also to resume warfarin added on the Plavix + ASA for 1 month, then warfarin + Plavix for 1 year (per Dr. Erin Hearing note 3/16). INR on 3/15 was 1.68. PTA warfarin was 5mg  on Mondays and Fridays and 2.5mg  all other days.  Goal of Therapy:  Heparin level 0.3-0.7 units/ml Monitor platelets by anticoagulation protocol: Yes   Plan:  1. Increase  heparin gtt to 1300units/hr 2. 8 hour HL to confirm 3. Warfarin 5mg  po x1 tonight 4. Daily HL, INR and CBC 5. F/u  s/s bleeding   Doris Cheadle, PharmD Clinical Pharmacist Pager: [phone] Phone: [phone] 04/02/2012 1:55 PM

## 2012-04-02 NOTE — Progress Notes (Addendum)
ANTICOAGULATION CONSULT NOTE - Follow Up Consult  Pharmacy Consult for heparin and warfarin Indication: chest pain/ACS, afib  Allergies  Allergen Reactions  . Shellfish Allergy Anaphylaxis  . Codeine     Feels sick    Patient Measurements: Height: 5\' 3"  (160 cm) Weight: 184 lb 8.4 oz (83.7 kg) IBW/kg (Calculated) : 52.4 Heparin Dosing Weight: 71kg  Vital Signs: Temp: 98.6 F (37 C) (03/16 0800) Temp src: Oral (03/16 0800) BP: 128/43 mmHg (03/16 0800) Pulse Rate: 67 (03/16 0800)  Labs:  Recent Labs  03/30/12 2116 03/31/12 0500 03/31/12 2041 04/01/12 0104 04/01/12 0108 04/01/12 0112 04/02/12 0525  HGB 12.4  --   --   --  12.8  --  10.6*  HCT 38.2  --   --   --  38.8  --  32.1*  PLT 470*  --   --   --  445*  --  375  LABPROT 26.3* 25.4*  --   --   --  19.2*  --   INR 2.56* 2.44*  --   --   --  1.68*  --   HEPARINUNFRC  --   --  0.11*  --   --   --  0.38  CREATININE 1.42* 1.26*  --   --  1.35*  --  1.34*  TROPONINI 1.11* 7.82*  --  2.73*  --   --   --     Estimated Creatinine Clearance: 39.5 ml/min (by C-G formula based on Cr of 1.34).  Assessment: 31 YOF with history of CAD s/p CABG and aortic valve replacement in 1991 and redo bypass in 2005 on chronic warfarin who was admitted for SOB. Had a torsade code 3/14 and was taken to cath 3/15- DES placed to left main extending into circumflex. Restarted heparin post-cath and first level therapeutic at 0.38.  CBC is stable, platelets are normal (were elevated) and no bleeding noted. Also to resume warfarin added on the Plavix + ASA for 1 month, then warfarin + Plavix for 1 year (per Dr. Erin Hearing note 3/16). INR on 3/15 was 1.68. PTA warfarin was 5mg  on Mondays and Fridays and 2.5mg  all other days.  Goal of Therapy:  Heparin level 0.3-0.7 units/ml Monitor platelets by anticoagulation protocol: Yes   Plan:  1. Continue heparin gtt at 1200units/hr 2. 8 hour HL to confirm 3. Warfarin 5mg  po x1 tonight 4. Daily HL, INR  and CBC 5. F/u  s/s bleeding  Arrion Broaddus D. Bedie Dominey, PharmD Clinical Pharmacist Pager: [phone] 04/02/2012 8:52 AM

## 2012-04-02 NOTE — Progress Notes (Signed)
[facility] Consultation Note [place] TEAM 1 - Stepdown/ICU TEAM   [name] WUJ:[id number] DOB: [birth year] DOA: 03/30/2012 PCP: Londell Moh, MD  Brief narrative: 72 year old female patient with history of DM 2/IDDM, who is on a Lantus study for the last 1 year, HTN, HL intolerant of statins, CAD status post CABG and PCI, chronic systolic CHF presented to ED on 03/30/12 with acute onset of dyspnea. She claims compliance with medications but not sure of dietary compliance. At 8:30 PM last night, she started having worsening dyspnea but without any chest pain or palpitations. She presented to the ED where code STEMI was called which was canceled by cardiology. She was found to be in decompensated CHF. IV Lasix was started with good effect.   Since admission the patient indicated that she was feeling much better with significant improvement in her dyspnea. Her lab work showed potassium 5.7, creatinine 1.4 and blood glucose of 443 mg/dL. Patient claimed compliance to her Lantus with home CBGs range between 125-140 mg/dL. She endorsed that blood sugars less than 90 mg/dL cause symptomatic hypoglycemia. The hospitalist service was requested to assist in her diabetes management.   Assessment/Plan: Active Problems:   Acute pulmonary edema due to decompensated CHF/Cardiomyopathy, ischemic, EF 35-40% 2D Nov 2011 -per Cards    NSTEMI, secondary to demand ischemia, Troponin >7 /history of CAD, CABG 1991, redo 2006, last cath Dec 2011, medical Rx -s/p cath and stent    Type 2 diabetes mellitus, uncontrolled -Insulin was on Insulin infusion -she explains has recently been enrolled in a Lantus study thru her PCP ofc-  dose was 63 units daily and last HgbA1c one week ago was elevated at 9.1 - Sugars elevated yesterday and this AM- modify diet order and follow sugars without any adjustment in insulin today. -I have advised that she will go home on NovoLog Sliding scale in addition to  Lantus (doses yet to be determined.   Torsades/ Prolonged QT Being managed by Cardiology    Acute renal insufficiency, SCr 1.4 on admission/ Hyperkalemia -improved -K+ now normal    HTN (hypertension) -controlled    Leukocytosis -stress induced? Resolved -UA negative, CXR c/w CHF only -also on chronic steroids  Chronic Steroids Per pt, she was placed on Prednisone for her "Neuropathy"    H/O mechanical aortic valve replacement, St Jude 1991/   Chronic anticoagulation with Coumadin  -per cards/pharmacy  Depression  Cymbalta and Remeron d/c'd by cardiology due to QT prolongation    Dyslipidemia   DVT prophylaxis: heparin infusion Code Status: Full Family Communication: Patient Disposition Plan: At discretion of primary team/cardiology Isolation: None  Consultants:  [facility]  Procedures: 2-D echocardiogram (03/31/12) pending  Antibiotics: None  HPI/Subjective: She was ordering her meals for the day when I entered the room - not on diabetic diet and requesting cobbler for dinner. Reminded to watch carb intake and the fact that she will d/c home with a Novolog sliding scae.    Objective: Blood pressure 128/43, pulse 67, temperature 98.6 F (37 C), temperature source Oral, resp. rate 18, height 5\' 3"  (1.6 m), weight 83.7 kg (184 lb 8.4 oz), SpO2 100.00%.  Intake/Output Summary (Last 24 hours) at 04/02/12 0959 Last data filed at 04/02/12 0800  Gross per 24 hour  Intake   1652 ml  Output   3100 ml  Net  -1448 ml     Exam: General: No acute respiratory distress Lungs: Clear to auscultation bilaterally without wheezes or crackles, RA Cardiovascular: Regular rate  and rhythm without murmur gallop or rub normal S1 and S2 Abdomen: Nontender, nondistended, soft, bowel sounds positive, no rebound, no ascites, no appreciable mass Extremities: No significant cyanosis, clubbing, or edema bilateral lower extremities  Data Reviewed: Basic Metabolic  Panel:  Recent Labs Lab 03/30/12 2116 03/31/12 0500 04/01/12 0108 04/02/12 0525  NA 136 140 133* 136  K 5.7* 4.1 3.9 4.2  CL 95* 99 92* 96  CO2 25 29 26 28   GLUCOSE 443* 158* 228* 236*  BUN 37* 40* 39* 34*  CREATININE 1.42* 1.26* 1.35* 1.34*  CALCIUM 9.7 9.3 9.8 8.5  MG  --   --  2.2  --   PHOS  --   --  3.2  --    Liver Function Tests:  Recent Labs Lab 03/30/12 2116 03/31/12 0500  AST 45* 54*  ALT 23 22  ALKPHOS 54 48  BILITOT 0.6 0.5  PROT 7.2 6.5  ALBUMIN 3.7 3.4*   No results found for this basename: LIPASE, AMYLASE,  in the last 168 hours No results found for this basename: AMMONIA,  in the last 168 hours CBC:  Recent Labs Lab 03/30/12 2116 04/01/12 0108 04/02/12 0525  WBC 14.9* 11.8* 10.4  HGB 12.4 12.8 10.6*  HCT 38.2 38.8 32.1*  MCV 103.0* 101.0* 101.3*  PLT 470* 445* 375   Cardiac Enzymes:  Recent Labs Lab 03/30/12 2116 03/31/12 0500 04/01/12 0104  TROPONINI 1.11* 7.82* 2.73*   BNP (last 3 results)  Recent Labs  03/30/12 2116  PROBNP 2717.0*   CBG:  Recent Labs Lab 04/01/12 0817 04/01/12 1233 04/01/12 1718 04/01/12 2201 04/02/12 0748  GLUCAP 132* 141* 278* 294* 172*    Recent Results (from the past 240 hour(s))  MRSA PCR SCREENING     Status: None   Collection Time    03/31/12  1:50 AM      Result Value Range Status   MRSA by PCR NEGATIVE  NEGATIVE Final   Comment:            The GeneXpert MRSA Assay (FDA     approved for NASAL specimens     only), is one component of a     comprehensive MRSA colonization     surveillance program. It is not     intended to diagnose MRSA     infection nor to guide or     monitor treatment for     MRSA infections.     Studies:  Recent x-ray studies have been reviewed in detail by the Attending Physician  Scheduled Meds:  Reviewed in detail by the Attending Physician  Calvert Cantor, MD [phone] If 7PM-7AM, please contact night-coverage [email or web address] Password [email or web address] 04/02/2012,  9:59 AM   LOS: 3 days

## 2012-04-03 ENCOUNTER — Inpatient Hospital Stay (HOSPITAL_COMMUNITY): Payer: Medicare Other

## 2012-04-03 DIAGNOSIS — I4721 Torsades de pointes: Secondary | ICD-10-CM | POA: Diagnosis not present

## 2012-04-03 DIAGNOSIS — I472 Ventricular tachycardia: Secondary | ICD-10-CM | POA: Diagnosis not present

## 2012-04-03 DIAGNOSIS — M069 Rheumatoid arthritis, unspecified: Secondary | ICD-10-CM | POA: Diagnosis present

## 2012-04-03 DIAGNOSIS — F329 Major depressive disorder, single episode, unspecified: Secondary | ICD-10-CM | POA: Diagnosis present

## 2012-04-03 DIAGNOSIS — F32A Depression, unspecified: Secondary | ICD-10-CM | POA: Diagnosis present

## 2012-04-03 LAB — CBC
HCT: 30.3 % — ABNORMAL LOW (ref 36.0–46.0)
Hemoglobin: 9.9 g/dL — ABNORMAL LOW (ref 12.0–15.0)
MCH: 32.4 pg (ref 26.0–34.0)
MCHC: 32.7 g/dL (ref 30.0–36.0)
MCV: 99 fL (ref 78.0–100.0)
Platelets: 317 10*3/uL (ref 150–400)
RBC: 3.06 MIL/uL — ABNORMAL LOW (ref 3.87–5.11)
RDW: 14.3 % (ref 11.5–15.5)
WBC: 9.7 10*3/uL (ref 4.0–10.5)

## 2012-04-03 LAB — HEPARIN LEVEL (UNFRACTIONATED): Heparin Unfractionated: 0.45 IU/mL (ref 0.30–0.70)

## 2012-04-03 LAB — BASIC METABOLIC PANEL
BUN: 53 mg/dL — ABNORMAL HIGH (ref 6–23)
Calcium: 8 mg/dL — ABNORMAL LOW (ref 8.4–10.5)
GFR calc Af Amer: 24 mL/min — ABNORMAL LOW (ref >90)
GFR calc non Af Amer: 20 mL/min — ABNORMAL LOW (ref >90)
Glucose, Bld: 180 mg/dL — ABNORMAL HIGH (ref 70–99)
Sodium: 130 mEq/L — ABNORMAL LOW (ref 135–145)

## 2012-04-03 LAB — PROTIME-INR: INR: 1.58 — ABNORMAL HIGH (ref 0.00–1.49)

## 2012-04-03 LAB — POCT ACTIVATED CLOTTING TIME: Activated Clotting Time: 519 seconds

## 2012-04-03 LAB — GLUCOSE, CAPILLARY
Glucose-Capillary: 134 mg/dL — ABNORMAL HIGH (ref 70–99)
Glucose-Capillary: 96 mg/dL (ref 70–99)

## 2012-04-03 MED ORDER — INSULIN ASPART 100 UNIT/ML ~~LOC~~ SOLN
10.0000 [IU] | Freq: Once | SUBCUTANEOUS | Status: AC
  Administered 2012-04-03: 10 [IU] via SUBCUTANEOUS

## 2012-04-03 MED ORDER — WARFARIN SODIUM 5 MG PO TABS
5.0000 mg | ORAL_TABLET | Freq: Once | ORAL | Status: AC
  Administered 2012-04-03: 5 mg via ORAL
  Filled 2012-04-03 (×2): qty 1

## 2012-04-03 MED ORDER — LISINOPRIL 10 MG PO TABS
10.0000 mg | ORAL_TABLET | Freq: Every day | ORAL | Status: DC
  Administered 2012-04-03: 10 mg via ORAL
  Filled 2012-04-03: qty 1

## 2012-04-03 MED ORDER — FENTANYL 12 MCG/HR TD PT72
12.5000 ug | MEDICATED_PATCH | TRANSDERMAL | Status: DC
  Administered 2012-04-03: 12.5 ug via TRANSDERMAL
  Filled 2012-04-03: qty 1

## 2012-04-03 MED ORDER — FUROSEMIDE 40 MG PO TABS
40.0000 mg | ORAL_TABLET | Freq: Every day | ORAL | Status: DC
  Administered 2012-04-03 – 2012-04-07 (×5): 40 mg via ORAL
  Filled 2012-04-03 (×5): qty 1

## 2012-04-03 MED ORDER — DULOXETINE HCL 60 MG PO CPEP
60.0000 mg | ORAL_CAPSULE | Freq: Every day | ORAL | Status: DC
  Administered 2012-04-03 – 2012-04-07 (×5): 60 mg via ORAL
  Filled 2012-04-03 (×5): qty 1

## 2012-04-03 MED FILL — Dextrose Inj 5%: INTRAVENOUS | Qty: 50 | Status: AC

## 2012-04-03 NOTE — Progress Notes (Signed)
MD on call Diona Fanti, Luke) paged and notified of Patients CBG result. New orders received, Will continue plan of care

## 2012-04-03 NOTE — Progress Notes (Signed)
[facility] and [facility]  Subjective: Still has significant pain along right rib cage from chest compression. Pain is not relieved from PRN Norco. No chest pain, groin pain or SOB.   Objective: Vital signs in last 24 hours: Temp:  [97.5 F (36.4 C)-99.6 F (37.6 C)] 98.6 F (37 C) (03/17 0800) Pulse Rate:  [59-72] 59 (03/17 0600) Resp:  [12-20] 15 (03/17 0800) BP: (91-116)/(23-75) 111/33 mmHg (03/17 0800) SpO2:  [94 %-100 %] 99 % (03/17 0800) Weight:  [189 lb 2.5 oz (85.8 kg)] 189 lb 2.5 oz (85.8 kg) (03/17 0500) Last BM Date: 03/31/12  Intake/Output from previous day: 03/16 0701 - 03/17 0700 In: 1595.1 [P.O.:1020; I.V.:575.1] Out: 625 [Urine:625] Intake/Output this shift: Total I/O In: 23 [I.V.:23] Out: -   Medications Current Facility-Administered Medications  Medication Dose Route Frequency Provider Last Rate Last Dose  . 0.9 %  sodium chloride infusion   Intravenous Continuous Elease Etienne, MD 10 mL/hr at 04/02/12 0600    . acetaminophen (TYLENOL) tablet 650 mg  650 mg Oral Q4H PRN Abelino Derrick, PA-C      . ALPRAZolam Prudy Feeler) tablet 0.25 mg  0.25 mg Oral BID PRN Abelino Derrick, PA-C      . aspirin chewable tablet 81 mg  81 mg Oral Daily Runell Gess, MD   81 mg at 04/02/12 0929  . calcium carbonate (OS-CAL - dosed in mg of elemental calcium) tablet 500 mg of elemental calcium  1 tablet Oral BID WC Eda Paschal Kilroy, PA-C   500 mg of elemental calcium at 04/02/12 1721  . clopidogrel (PLAVIX) tablet 75 mg  75 mg Oral Q breakfast Corky Crafts, MD   75 mg at 04/02/12 1610  . dextrose 50 % solution 25 mL  25 mL Intravenous PRN Eda Paschal Kilroy, PA-C      . dextrose 50 % solution 25 mL  25 mL Intravenous PRN Elease Etienne, MD      . fenofibrate tablet 54 mg  54 mg Oral Daily Abelino Derrick, PA-C   54 mg at 04/02/12 9604  . fentaNYL (SUBLIMAZE) injection 25 mcg  25 mcg Intravenous Q1H PRN Thurmon Fair, MD   25 mcg at 04/02/12 2359  . gabapentin  (NEURONTIN) capsule 300 mg  300 mg Oral BID Brittainy Simmons, PA-C   300 mg at 04/02/12 2213  . gabapentin (NEURONTIN) capsule 300 mg  300 mg Oral Daily PRN Brittainy Simmons, PA-C      . heparin ADULT infusion 100 units/mL (25000 units/250 mL)  1,300 Units/hr Intravenous Continuous Drue Fritschle, RPH 13 mL/hr at 04/02/12 2000 1,300 Units/hr at 04/02/12 2000  . HYDROcodone-acetaminophen (NORCO) 7.5-325 MG per tablet 1 tablet  1 tablet Oral Q4H PRN Robbie Lis, PA-C   1 tablet at 04/03/12 0328  . insulin aspart (novoLOG) injection 0-15 Units  0-15 Units Subcutaneous TID WC Russella Dar, NP   8 Units at 04/02/12 1721  . insulin aspart (novoLOG) injection 0-5 Units  0-5 Units Subcutaneous QHS Russella Dar, NP   3 Units at 04/01/12 2218  . insulin glargine (LANTUS) injection 64 Units  64 Units Subcutaneous Q breakfast Corky Crafts, MD   64 Units at 04/02/12 [phone]  . losartan (COZAAR) tablet 100 mg  100 mg Oral Daily Brittainy Simmons, PA-C   100 mg at 04/02/12 8119  . metoprolol succinate (TOPROL-XL) 24 hr tablet 50 mg  50 mg Oral BID Brittainy Simmons, PA-C   50 mg  at 04/02/12 2213  . multivitamin with minerals tablet 1 tablet  1 tablet Oral Daily Eda Paschal [place], PA-C   1 tablet at 04/02/12 1610  . mupirocin ointment (BACTROBAN) 2 %   Topical BID Eda Paschal Kilroy, PA-C      . nitroGLYCERIN (NITROSTAT) SL tablet 0.4 mg  0.4 mg Sublingual Q5 Min x 3 PRN Eda Paschal Kilroy, PA-C      . nitroGLYCERIN 0.2 mg/mL in dextrose 5 % infusion  5 mcg/min Intravenous Titrated Luke K Kilroy, PA-C      . ondansetron [facility]) injection 4 mg  4 mg Intravenous Q6H PRN Abelino Derrick, PA-C   4 mg at 04/01/12 1412  . pantoprazole (PROTONIX) EC tablet 40 mg  40 mg Oral Daily Abelino Derrick, PA-C   40 mg at 04/02/12 9604  . potassium chloride SA (K-DUR,KLOR-CON) CR tablet 20 mEq  20 mEq Oral Q M,W,F Corky Crafts, MD      . predniSONE (DELTASONE) tablet 10 mg  10 mg Oral Daily Corky Crafts, MD   10 mg  at 04/02/12 5409  . temazepam (RESTORIL) capsule 30 mg  30 mg Oral QHS PRN Abelino Derrick, PA-C   30 mg at 04/01/12 2301  . vitamin B-12 (CYANOCOBALAMIN) tablet 1,000 mcg  1,000 mcg Oral BID Abelino Derrick, PA-C   1,000 mcg at 04/02/12 2213  . Warfarin - Pharmacist Dosing Inpatient   Does not apply q1800 Lauren Bajbus, RPH        PE: General appearance: alert, cooperative and no distress Lungs: clear to auscultation bilaterally Heart: regular rate and rhythm and crisp mechanic valve sounds Extremities: no LEE Pulses: 2+ and symmetric Skin: warm and dry Neurologic: Grossly normal  Lab Results:   Recent Labs  04/01/12 0108 04/02/12 0525 04/03/12 0512  WBC 11.8* 10.4 9.7  HGB 12.8 10.6* 9.9*  HCT 38.8 32.1* 30.3*  PLT 445* 375 317   BMET  Recent Labs  04/01/12 0108 04/02/12 0525  NA 133* 136  K 3.9 4.2  CL 92* 96  CO2 26 28  GLUCOSE 228* 236*  BUN 39* 34*  CREATININE 1.35* 1.34*  CALCIUM 9.8 8.5   PT/INR  Recent Labs  04/01/12 0112  LABPROT 19.2*  INR 1.68*    Assessment/Plan  Principal Problem:   NSTEMI, secondary to demand ischemia, Troponin >7 Active Problems:   CAD, CABG 1991, redo 2006, S/P DES to Left Main 04/01/12   Acute renal insufficiency, SCr 1.4 on admission   H/O mechanical aortic valve replacement, St Jude 1991   Chronic anticoagulation with Coumadin   Dyslipidemia   HTN (hypertension)   Cardiomyopathy, ischemic, EF 35-40% 2D Nov 2011   Type 2 diabetes mellitus, uncontrolled   Hyperkalemia   Leukocytosis   Acute pulmonary edema  Plan: S/P DES to Left main. POD#2. Dr. Allyson Sabal to look at films to see if the OM needs intervention. Groin remains stable. CP free. She is still on heparin. Will resume warfarin for mechanical aortic valve, once INR >2. INR is pending (1.68 yesterday). Plan to continue with ASA+Warfarin + Plavix for 1 month, then warfarin + Plavix thereafter to minimize bleeding risk. HR and BP both stable. She has had no further  arrhythmias on telemetry. IV Lidocaine has been d/c. Both her Cymbalta and Remeron were discharged 2 days ago for QT prolongation. Will need to find an acceptable alternative to treat her depression. Pt also has significant pain along her right rib cage (she likely  suffered rib fx from chest compressions). She is on Norco 7.5-325 Q4H with little relief. Will give something for pain. Will likely transfer to stepdown later today. MD to follow.    LOS: 4 days    Brittainy M. Delmer Islam 04/03/2012 8:17 AM  I have seen and examined the patient along with Brittainy M. Sharol Harness, PA-C.  I have reviewed the chart, notes and new data.  I agree with PA's note.  Please also see note with Corine Shelter, same day.  Thurmon Fair, MD, [facility] and Vascular Center [phone] 04/03/2012, 11:07 AM

## 2012-04-03 NOTE — Progress Notes (Signed)
Subjective:  C/O rib pain.   Objective:  Vital Signs in the last 24 hours: Temp:  [97.5 F (36.4 C)-99.6 F (37.6 C)] 98.6 F (37 C) (03/17 0800) Pulse Rate:  [59-72] 59 (03/17 0600) Resp:  [12-20] 15 (03/17 0800) BP: (91-116)/(23-63) 111/33 mmHg (03/17 0800) SpO2:  [94 %-100 %] 99 % (03/17 0800) Weight:  [85.8 kg (189 lb 2.5 oz)] 85.8 kg (189 lb 2.5 oz) (03/17 0500)  Intake/Output from previous day:  Intake/Output Summary (Last 24 hours) at 04/03/12 0905 Last data filed at 04/03/12 0800  Gross per 24 hour  Intake 1304.05 ml  Output    475 ml  Net 829.05 ml    Physical Exam: General appearance: alert, cooperative and no distress Lungs: basilar crackles bilat Heart: regular rate and rhythm Extremities: no edema   Rate: 74  Rhythm: normal sinus rhythm  Lab Results:  Recent Labs  04/02/12 0525 04/03/12 0512  WBC 10.4 9.7  HGB 10.6* 9.9*  PLT 375 317    Recent Labs  04/01/12 0108 04/02/12 0525  NA 133* 136  K 3.9 4.2  CL 92* 96  CO2 26 28  GLUCOSE 228* 236*  BUN 39* 34*  CREATININE 1.35* 1.34*    Recent Labs  04/01/12 0104  TROPONINI 2.73*   Hepatic Function Panel No results found for this basename: PROT, ALBUMIN, AST, ALT, ALKPHOS, BILITOT, BILIDIR, IBILI,  in the last 72 hours No results found for this basename: CHOL,  in the last 72 hours  Recent Labs  04/01/12 0112  INR 1.68*    Imaging: Imaging results have been reviewed  Cardiac Studies:  Assessment/Plan:   Principal Problem:   NSTEMI, secondary to demand ischemia, Troponin >7  Active Problems:   Acute pulmonary edema   Cardiac arrest 04/01/12   Torsades de pointes 04/01/12   CAD, CABG 1991, redo 2006,- S/P DES to Left Main 04/01/12   H/O mechanical aortic valve replacement, St Jude 1991   Chronic anticoagulation with Coumadin   Cardiomyopathy, ischemic, EF 35-40% 2D 03/31/12   Type 2 diabetes mellitus, uncontrolled   Acute renal failure after cath 04/01/12   Dyslipidemia  HTN (hypertension)   Hyperkalemia- 03/30/12- resolved   Depression- on Cymabalta and Remeron prior to admission- this may have contributed to prolonged QTc  Plan- Check rib films and PA and Lat CXR  Today. She is now urinating. Resume Cymbalta without Remeron. Tx to stepdown. Try Fentanyl patch for pain. Hold ARB and K+ (ARF after cath/PCI, no BMP today in computer).  Consider a dose of Lasix today- she is not on a diuretic.    Corine Shelter PA-C 04/03/2012, 9:05 AM   I have seen and examined the patient along with Corine Shelter PA-C.  I have reviewed the chart, notes and new data.  I agree with PA's note.  Key new complaints: rib/chest wall pain Key examination changes: no arrhythmia  PLAN: Transfer to stepdown. Possible DC tomorrow. Daily furosemide.  Thurmon Fair, MD, [facility] and Vascular Center [phone] 04/03/2012, 11:04 AM

## 2012-04-03 NOTE — Progress Notes (Signed)
CARDIAC REHAB PHASE I   PRE:  Rate/Rhythm: 68 SR  BP:  Supine:   Sitting: 91/44  Standing:    SaO2:   MODE:  Ambulation: 150 ft   POST:  Rate/Rhythm: 92 SR  BP:  Supine:   Sitting: 114/35  Standing:    SaO2:  1050-1135 Assisted X 1 to ambulate. Gait steady. Pt leans forward with walking. Pt tires and c/o of rib soreness and feeling stiff all over. Pt able to walk 150 feet without c/o of cp or SOB. VS stable. Pt back to recliner after walk with call light in reach. Started MI and stent education with pt and husband. Pt voices understanding.  [name] RN 04/03/2012 11:33 AM

## 2012-04-03 NOTE — Progress Notes (Signed)
[facility] Consultation F/U Note [place] TEAM 1 - Stepdown/ICU TEAM   [name] ZOX:[id number] DOB: [birth year] DOA: 03/30/2012 PCP: Londell Moh, MD  Brief narrative: 72 year old female patient with history of DM 2/IDDM on a "Lantus study" for the last 1 year, HTN, HL intolerant of statins, CAD status post CABG and PCI, chronic systolic CHF presented to ED on 03/30/12 with acute onset of dyspnea. She claims compliance with medications but not sure of dietary compliance. At 8:30 PM the night before admission, she started having worsening dyspnea but without any chest pain or palpitations. She presented to the ED where code STEMI was called which was canceled by cardiology. She was found to be in decompensated CHF. IV Lasix was started with good effect.   Since admission the patient indicated that she was feeling much better with significant improvement in her dyspnea. Her lab work showed potassium 5.7, creatinine 1.4 and blood glucose of 443 mg/dL. Patient claimed compliance to her Lantus with home CBGs range between 125-140 mg/dL. She endorsed that blood sugars less than 90 mg/dL cause symptomatic hypoglycemia. The hospitalist service was requested to assist in her diabetes management.  Assessment/Plan:  Acute pulmonary edema due to decompensated CHF/Cardiomyopathy, ischemic, EF 35-40% 2D Nov 2011 -per Cards  NSTEMI, secondary to demand ischemia, Troponin >7 /history of CAD, CABG 1991, redo 2006, last cath Dec 2011, medical Rx -s/p cath and stent  Type 2 diabetes mellitus, uncontrolled -she explains has recently been enrolled in a Lantus study thru her PCP ofc-  dose was 63 units daily and last HgbA1c one week ago was elevated at 9.1 -sugars elevated yesterday and this AM- modify diet order and follow sugars without any adjustment in insulin today. -pt aware likely will need both long acting as well as SSI after dc  Torsades/ Prolonged QT -Being managed by  Cardiology  Acute renal insufficiency, SCr 1.4 on admission/ Hyperkalemia -improved initially, but crt now climbing - will need to follow - timing is suspicious for contrast induced nephropathy - STOP ACE for now  -K+ normal presently   HTN (hypertension) -controlled  Leukocytosis -on chronic steroids  Chronic Steroids -Per pt, she was placed on Prednisone for her "Neuropathy"  H/O mechanical aortic valve replacement, St Jude 1991/Chronic anticoagulation with Coumadin  -per Cards/pharmacy  Depression  Cymbalta and Remeron d/c'd by cardiology due to QT prolongation  Dyslipidemia  DVT prophylaxis: heparin infusion Code Status: Full Family Communication: Patient Disposition Plan: At discretion of primary team/cardiology Isolation: None  Procedures: 2-D echocardiogram (03/31/12)  Study Conclusions - Left ventricle: The cavity size was normal. Wall thickness was increased in a pattern of mild LVH. Systolic function was moderately reduced. The estimated ejection fraction was in the range of 35% to 40%. Moderate hypokinesis of the mid-distalanteroseptal, anterior, and apical myocardium. Moderate hypokinesis of the basal-midinferior and inferoseptal myocardium. Features are consistent with a pseudonormal left ventricular filling pattern, with concomitant abnormal relaxation and increased filling pressure (grade 2 diastolic dysfunction). Doppler parameters are consistent with elevated mean left atrial filling pressure. - Ventricular septum: Septal motion showed paradox. - Aortic valve: A mechanical prosthesis was present and functioning normally. Trivial regurgitation. Valve area: 1.19cm^2(VTI). Valve area: 2.9cm^2 (Vmean). - Mitral valve: Calcified annulus. Mildly thickened leaflets .- Left atrium: The atrium was moderately dilated. - Atrial septum: The septum bowed from left to right, consistent with increased left atrial pressure. Impressions: - Compared to 12/2011,  there appears to be a new inferior wall motion abnormality and the overall  LV systolic function has diminished.  Antibiotics: None  HPI/Subjective: Up in chair- endorsed rib pain and lateral CW pain.  Objective: Blood pressure 111/33, pulse 59, temperature 98.6 F (37 C), temperature source Oral, resp. rate 15, height 5\' 3"  (1.6 m), weight 85.8 kg (189 lb 2.5 oz), SpO2 99.00%.  Intake/Output Summary (Last 24 hours) at 04/03/12 1056 Last data filed at 04/03/12 1000  Gross per 24 hour  Intake 1328.05 ml  Output    475 ml  Net 853.05 ml   Exam: General: No acute respiratory distress Lungs: Clear to auscultation bilaterally without wheezes or crackles, RA , tender over lateral chest wall bilaterally Cardiovascular: Regular rate and rhythm without murmur gallop or rub normal S1 and S2 Abdomen: Nontender, nondistended, soft, bowel sounds positive, no rebound, no ascites, no appreciable mass Extremities: No significant cyanosis, clubbing, or edema bilateral lower extremities  Data Reviewed: Basic Metabolic Panel:  Recent Labs Lab 03/30/12 2116 03/31/12 0500 04/01/12 0108 04/02/12 0525 04/03/12 0910  NA 136 140 133* 136 130*  K 5.7* 4.1 3.9 4.2 4.7  CL 95* 99 92* 96 94*  CO2 25 29 26 28 27   GLUCOSE 443* 158* 228* 236* 180*  BUN 37* 40* 39* 34* 53*  CREATININE 1.42* 1.26* 1.35* 1.34* 2.28*  CALCIUM 9.7 9.3 9.8 8.5 8.0*  MG  --   --  2.2  --   --   PHOS  --   --  3.2  --   --    Liver Function Tests:  Recent Labs Lab 03/30/12 2116 03/31/12 0500  AST 45* 54*  ALT 23 22  ALKPHOS 54 48  BILITOT 0.6 0.5  PROT 7.2 6.5  ALBUMIN 3.7 3.4*   CBC:  Recent Labs Lab 03/30/12 2116 04/01/12 0108 04/02/12 0525 04/03/12 0512  WBC 14.9* 11.8* 10.4 9.7  HGB 12.4 12.8 10.6* 9.9*  HCT 38.2 38.8 32.1* 30.3*  MCV 103.0* 101.0* 101.3* 99.0  PLT 470* 445* 375 317   Cardiac Enzymes:  Recent Labs Lab 03/30/12 2116 03/31/12 0500 04/01/12 0104  TROPONINI 1.11* 7.82* 2.73*    BNP (last 3 results)  Recent Labs  03/30/12 2116  PROBNP 2717.0*   CBG:  Recent Labs Lab 04/02/12 0748 04/02/12 1209 04/02/12 1604 04/02/12 2211 04/03/12 0733  GLUCAP 172* 275* 254* 134* 96    Recent Results (from the past 240 hour(s))  MRSA PCR SCREENING     Status: None   Collection Time    03/31/12  1:50 AM      Result Value Range Status   MRSA by PCR NEGATIVE  NEGATIVE Final   Comment:            The GeneXpert MRSA Assay (FDA     approved for NASAL specimens     only), is one component of a     comprehensive MRSA colonization     surveillance program. It is not     intended to diagnose MRSA     infection nor to guide or     monitor treatment for     MRSA infections.     Studies:  Recent x-ray studies have been reviewed in detail by the Attending Physician  Scheduled Meds:  Reviewed in detail by the Attending Physician  Junious Silk, ANP [phone]  If 7PM-7AM, please contact night-coverage [email or web address] Password [email or web address] 04/03/2012, 10:56 AM   LOS: 4 days   I have personally examined this patient and reviewed the entire database. I have reviewed the above note, made  any necessary editorial changes, and agree with its content.  Lonia Blood, MD [facility]

## 2012-04-03 NOTE — Progress Notes (Signed)
Inpatient Diabetes Program Recommendations  AACE/ADA: New Consensus Statement on Inpatient Glycemic Control (2013)  Target Ranges:  Prepandial:   less than 140 mg/dL      Peak postprandial:   less than 180 mg/dL (1-2 hours)      Critically ill patients:  140 - 180 mg/dL  Results for [name], [name] (MRN [id number]) as of 04/03/2012 11:41  Ref. Range 04/02/2012 07:48 04/02/2012 12:09 04/02/2012 16:04 04/02/2012 22:11 04/03/2012 07:33  Glucose-Capillary Latest Range: 70-99 mg/dL 119 (H) 147 (H) 829 (H) 134 (H) 96    Inpatient Diabetes Program Recommendations Insulin - Meal Coverage: Add Novolog 4 units TID with meals for postprandial hyperglycemia. Thank you  Piedad Climes BSN, RN,CDE Inpatient Diabetes Coordinator [phone] (team pager)

## 2012-04-03 NOTE — Progress Notes (Addendum)
ANTICOAGULATION CONSULT NOTE - Follow Up Consult  Pharmacy Consult for heparin and warfarin Indication: chest pain/ACS, afib  Allergies  Allergen Reactions  . Shellfish Allergy Anaphylaxis  . Codeine     Feels sick    Patient Measurements: Height: 5\' 3"  (160 cm) Weight: 189 lb 2.5 oz (85.8 kg) IBW/kg (Calculated) : 52.4 Heparin Dosing Weight: 71kg  Vital Signs: Temp: 98.6 F (37 C) (03/17 0800) Temp src: Oral (03/17 0800) BP: 111/33 mmHg (03/17 0800) Pulse Rate: 59 (03/17 0600)  Labs:  Recent Labs  04/01/12 0104  04/01/12 0108 04/01/12 0112 04/02/12 0525 04/02/12 1232 04/02/12 2231 04/03/12 0512  HGB  --   < > 12.8  --  10.6*  --   --  9.9*  HCT  --   --  38.8  --  32.1*  --   --  30.3*  PLT  --   --  445*  --  375  --   --  317  LABPROT  --   --   --  19.2*  --   --   --   --   INR  --   --   --  1.68*  --   --   --   --   HEPARINUNFRC  --   --   --   --  0.38 0.26* 0.33 0.45  CREATININE  --   --  1.35*  --  1.34*  --   --   --   TROPONINI 2.73*  --   --   --   --   --   --   --   < > = values in this interval not displayed.  Estimated Creatinine Clearance: 40 ml/min (by C-G formula based on Cr of 1.34).  Assessment: 7 YOF with history of CAD s/p CABG and aortic valve replacement in 1991 and redo bypass in 2005 on chronic warfarin who was admitted for SOB.   Heparin level is 0.45 today and INR =1.58 (coumadin resumed 3/16). Hg= 9.9 and pltc=317.  PTA warfarin: 5mg  on Mondays and Fridays and 2.5mg  all other days.  Goal of Therapy:  Heparin level 0.3-0.7 units/ml Monitor platelets by anticoagulation protocol: Yes   Plan:  -No heparin changes needed -Daily CBC and heparin level -Daily PT/INR -Coumadin 5mg  po today  Harland German, Pharm D 04/03/2012 8:47 AM

## 2012-04-04 LAB — CBC
HCT: 29.6 % — ABNORMAL LOW (ref 36.0–46.0)
Hemoglobin: 9.9 g/dL — ABNORMAL LOW (ref 12.0–15.0)
MCH: 32.9 pg (ref 26.0–34.0)
MCHC: 33.4 g/dL (ref 30.0–36.0)
MCV: 98.3 fL (ref 78.0–100.0)
Platelets: 319 10*3/uL (ref 150–400)
RBC: 3.01 MIL/uL — ABNORMAL LOW (ref 3.87–5.11)
RDW: 14.1 % (ref 11.5–15.5)
WBC: 8.8 10*3/uL (ref 4.0–10.5)

## 2012-04-04 LAB — BASIC METABOLIC PANEL
CO2: 24 mEq/L (ref 19–32)
Calcium: 8.3 mg/dL — ABNORMAL LOW (ref 8.4–10.5)
Chloride: 97 mEq/L (ref 96–112)
Creatinine, Ser: 1.96 mg/dL — ABNORMAL HIGH (ref 0.50–1.10)
Glucose, Bld: 129 mg/dL — ABNORMAL HIGH (ref 70–99)

## 2012-04-04 LAB — PROTIME-INR
INR: 1.54 — ABNORMAL HIGH (ref 0.00–1.49)
Prothrombin Time: 18 seconds — ABNORMAL HIGH (ref 11.6–15.2)

## 2012-04-04 LAB — GLUCOSE, CAPILLARY
Glucose-Capillary: 119 mg/dL — ABNORMAL HIGH (ref 70–99)
Glucose-Capillary: 222 mg/dL — ABNORMAL HIGH (ref 70–99)

## 2012-04-04 LAB — HEPARIN LEVEL (UNFRACTIONATED): Heparin Unfractionated: 0.69 IU/mL (ref 0.30–0.70)

## 2012-04-04 MED ORDER — INSULIN ASPART 100 UNIT/ML ~~LOC~~ SOLN
3.0000 [IU] | Freq: Three times a day (TID) | SUBCUTANEOUS | Status: DC
  Administered 2012-04-04 – 2012-04-06 (×7): 3 [IU] via SUBCUTANEOUS

## 2012-04-04 MED ORDER — WARFARIN SODIUM 7.5 MG PO TABS
7.5000 mg | ORAL_TABLET | Freq: Once | ORAL | Status: AC
  Administered 2012-04-04: 7.5 mg via ORAL
  Filled 2012-04-04: qty 1

## 2012-04-04 NOTE — Progress Notes (Signed)
Pt tolerated ambulation in hallway with rolling walker and 1+assist. Gait steady Egbert Garibaldi A

## 2012-04-04 NOTE — Progress Notes (Signed)
Subjective:  Up in chair, no chest pain or SOB.  Objective:  Vital Signs in the last 24 hours: Temp:  [97.9 F (36.6 C)-98.5 F (36.9 C)] 98.3 F (36.8 C) (03/18 0357) Pulse Rate:  [63-70] 63 (03/18 0900) Resp:  [16-21] 16 (03/18 0357) BP: (91-132)/(29-48) 103/42 mmHg (03/18 0900) SpO2:  [98 %-100 %] 100 % (03/18 0357) Weight:  [86.637 kg (191 lb)] 86.637 kg (191 lb) (03/18 0357)  Intake/Output from previous day:  Intake/Output Summary (Last 24 hours) at 04/04/12 1021 Last data filed at 04/04/12 1000  Gross per 24 hour  Intake    595 ml  Output   1300 ml  Net   -705 ml    Physical Exam: General appearance: alert, cooperative, no distress and moderately obese Lungs: decreased breath sounds bilat Heart: regular rate and rhythm and positive valve sounds  Rt groin ecchymotic, no hematoma   Rate: 65  Rhythm: normal sinus rhythm and PVCs, 1st AVB  Lab Results:  Recent Labs  04/03/12 0512 04/04/12 0425  WBC 9.7 8.8  HGB 9.9* 9.9*  PLT 317 319    Recent Labs  04/03/12 0910 04/04/12 0425  NA 130* 136  K 4.7 5.0  CL 94* 97  CO2 27 24  GLUCOSE 180* 129*  BUN 53* 64*  CREATININE 2.28* 1.96*   No results found for this basename: TROPONINI, CK, MB,  in the last 72 hours Hepatic Function Panel No results found for this basename: PROT, ALBUMIN, AST, ALT, ALKPHOS, BILITOT, BILIDIR, IBILI,  in the last 72 hours No results found for this basename: CHOL,  in the last 72 hours  Recent Labs  04/04/12 0425  INR 1.54*    Imaging: Imaging results have been reviewed  Cardiac Studies:  Assessment/Plan:   Principal Problem:   NSTEMI, secondary to demand ischemia, Troponin >7 Active Problems:   Acute pulmonary edema   Cardiac arrest 04/01/12   Torsades de pointes 04/01/12   CAD, CABG 1991, redo 2006, S/P DES to Left Main 04/01/12   H/O mechanical aortic valve replacement, St Jude 1991   Chronic anticoagulation with Coumadin   Cardiomyopathy, ischemic, EF 35-40% 2D  03/31/12   Type 2 diabetes mellitus, uncontrolled   Acute renal failure after cath 04/01/12   Depression- on Remron and Cymbalta prior to admission 03/30/12- combination may have contributed to [place], prolonged QTc   Dyslipidemia   HTN (hypertension)   Hyperkalemia- 03/30/12- resolved   Rheumatoid arthritis- chronic steroids    Plan- Heparin to Coumadin for St Jude AVR. Her SCr is improving. Rib films show no acute fx after CPR. Cymbalta resumed, check EKG/Qtc. Home when INR therapeutic. Follow Hgb, check stools.    Corine Shelter PA-C 04/04/2012, 10:21 AM.  Agree with note written by Corine Shelter PAC  No CP/SOB. S/P emergent LM/Prox LCX stenting by Dr. Eldridge Dace. Minimal trop leak. SCr trending down. On IV hep for Mechanical AoV. Coumadin started. Home when INR therapeutic,   Runell Gess 04/04/2012 10:50 AM

## 2012-04-04 NOTE — Progress Notes (Signed)
[facility] Consultation F/U Note   [name] JYN:[id number] DOB: 1940-12-30 DOA: 03/30/2012 PCP: Londell Moh, MD  Brief narrative: 72 year old female patient with history of DM 2/IDDM on a "Lantus study" for the last 1 year, HTN, HL intolerant of statins, CAD status post CABG and PCI, chronic systolic CHF presented to ED on 03/30/12 with acute onset of dyspnea. She claims compliance with medications but not sure of dietary compliance. At 8:30 PM the night before admission, she started having worsening dyspnea but without any chest pain or palpitations. She presented to the ED where code STEMI was called which was canceled by cardiology. She was found to be in decompensated CHF. IV Lasix was started with good effect. Since admission the patient indicated that she was feeling much better with significant improvement in her dyspnea. Her lab work showed potassium 5.7, creatinine 1.4 and blood glucose of 443 mg/dL. Patient claimed compliance to her Lantus with home CBGs range between 125-140 mg/dL. She endorsed that blood sugars less than 90 mg/dL cause symptomatic hypoglycemia. The hospitalist service was requested to assist in her diabetes management.  Assessment/Plan:  Acute pulmonary edema due to decompensated CHF/Cardiomyopathy, ischemic, EF 35-40% 2D Nov 2011 -per Cards  NSTEMI, secondary to demand ischemia, Troponin >7 /history of CAD, CABG 1991, redo 2006, last cath Dec 2011, medical Rx -s/p cath and stent  Type 2 diabetes mellitus, uncontrolled -she explains has recently been enrolled in a Lantus study thru her PCP ofc-  dose was 63 units daily and last HgbA1c one week ago was elevated at 9.1 - fasting sugars OK this morning but trends up during daytime, may need more mealtime coverage.  - complicating since she is on Prednisone.  - will add standing mealtime coverage 3U plus SSI.  Torsades/ Prolonged QT -Being managed by Cardiology  Acute renal insufficiency, SCr  1.4 on admission/ Hyperkalemia - improved initially, but crt now climbing - will need to follow - timing is suspicious for contrast induced nephropathy - STOP ACE for now  - renal function improving today.   HTN (hypertension) -controlled  Leukocytosis -on chronic steroids  Chronic Steroids - she is on this for her chronic RA. She is also on Methotrexate 15 mg weekly and her PCP has been having difficulties weaning her Prednisone. Once she goes less than 10 mg, she is having a flare-up with bilateral hand small joint swelling and pain as well as prolonged stiffness in the morning.   H/O mechanical aortic valve replacement, St Jude 1991/Chronic anticoagulation with Coumadin  -per Cards/pharmacy  Depression  Cymbalta and Remeron d/c'd by cardiology due to QT prolongation  Dyslipidemia  DVT prophylaxis: heparin infusion Code Status: Full Family Communication: Patient  Procedures: 2-D echocardiogram (03/31/12)  Study Conclusions - Left ventricle: The cavity size was normal. Wall thickness was increased in a pattern of mild LVH. Systolic function was moderately reduced. The estimated ejection fraction was in the range of 35% to 40%. Moderate hypokinesis of the mid-distalanteroseptal, anterior, and apical myocardium. Moderate hypokinesis of the basal-midinferior and inferoseptal myocardium. Features are consistent with a pseudonormal left ventricular filling pattern, with concomitant abnormal relaxation and increased filling pressure (grade 2 diastolic dysfunction). Doppler parameters are consistent with elevated mean left atrial filling pressure. - Ventricular septum: Septal motion showed paradox. - Aortic valve: A mechanical prosthesis was present and functioning normally. Trivial regurgitation. Valve area: 1.19cm^2(VTI). Valve area: 2.9cm^2 (Vmean). - Mitral valve: Calcified annulus. Mildly thickened leaflets .- Left atrium: The atrium was moderately dilated. - Atrial  septum: The septum bowed from left to right, consistent with increased left atrial pressure. Impressions: - Compared to 12/2011, there appears to be a new inferior wall motion abnormality and the overall LV systolic function has diminished.  Antibiotics: None  HPI/Subjective: - no complaints, feels well today  Objective: Blood pressure 103/42, pulse 63, temperature 98.3 F (36.8 C), temperature source Oral, resp. rate 16, height 5\' 3"  (1.6 m), weight 86.637 kg (191 lb), SpO2 100.00%.  Intake/Output Summary (Last 24 hours) at 04/04/12 1322 Last data filed at 04/04/12 1000  Gross per 24 hour  Intake     46 ml  Output   1100 ml  Net  -1054 ml   Exam: General: No acute respiratory distress Lungs: Clear to auscultation bilaterally without wheezes or crackles, RA , tender over lateral chest wall bilaterally Cardiovascular: Regular rate and rhythm without murmur gallop or rub normal S1 and S2 Abdomen: Nontender, nondistended, soft, bowel sounds positive, no rebound, no ascites, no appreciable mass Extremities: No significant cyanosis, clubbing, or edema bilateral lower extremities  Data Reviewed: Basic Metabolic Panel:  Recent Labs Lab 03/31/12 0500 04/01/12 0108 04/02/12 0525 04/03/12 0910 04/04/12 0425  NA 140 133* 136 130* 136  K 4.1 3.9 4.2 4.7 5.0  CL 99 92* 96 94* 97  CO2 29 26 28 27 24   GLUCOSE 158* 228* 236* 180* 129*  BUN 40* 39* 34* 53* 64*  CREATININE 1.26* 1.35* 1.34* 2.28* 1.96*  CALCIUM 9.3 9.8 8.5 8.0* 8.3*  MG  --  2.2  --   --   --   PHOS  --  3.2  --   --   --    Liver Function Tests:  Recent Labs Lab 03/30/12 2116 03/31/12 0500  AST 45* 54*  ALT 23 22  ALKPHOS 54 48  BILITOT 0.6 0.5  PROT 7.2 6.5  ALBUMIN 3.7 3.4*   CBC:  Recent Labs Lab 03/30/12 2116 04/01/12 0108 04/02/12 0525 04/03/12 0512 04/04/12 0425  WBC 14.9* 11.8* 10.4 9.7 8.8  HGB 12.4 12.8 10.6* 9.9* 9.9*  HCT 38.2 38.8 32.1* 30.3* 29.6*  MCV 103.0* 101.0* 101.3* 99.0  98.3  PLT 470* 445* 375 317 319   Cardiac Enzymes:  Recent Labs Lab 03/30/12 2116 03/31/12 0500 04/01/12 0104  TROPONINI 1.11* 7.82* 2.73*   BNP (last 3 results)  Recent Labs  03/30/12 2116  PROBNP 2717.0*   CBG:  Recent Labs Lab 04/03/12 1159 04/03/12 1624 04/03/12 2047 04/04/12 0619 04/04/12 1133  GLUCAP 197* 394* 419* 119* 195*    Recent Results (from the past 240 hour(s))  MRSA PCR SCREENING     Status: None   Collection Time    03/31/12  1:50 AM      Result Value Range Status   MRSA by PCR NEGATIVE  NEGATIVE Final   Comment:            The GeneXpert MRSA Assay (FDA     approved for NASAL specimens     only), is one component of a     comprehensive MRSA colonization     surveillance program. It is not     intended to diagnose MRSA     infection nor to guide or     monitor treatment for     MRSA infections.     Studies:  Recent x-ray studies have been reviewed in detail by the Attending Physician  Scheduled Meds: Scheduled Meds: . aspirin  81 mg Oral Daily  . calcium carbonate  1 tablet Oral BID WC  . clopidogrel  75 mg Oral Q breakfast  . DULoxetine  60 mg Oral Daily  . fenofibrate  54 mg Oral Daily  . fentaNYL  12.5 mcg Transdermal Q72H  . furosemide  40 mg Oral Daily  . gabapentin  300 mg Oral BID  . insulin aspart  0-15 Units Subcutaneous TID WC  . insulin aspart  0-5 Units Subcutaneous QHS  . insulin glargine  64 Units Subcutaneous Q breakfast  . metoprolol succinate  50 mg Oral BID  . multivitamin with minerals  1 tablet Oral Daily  . mupirocin ointment   Topical BID  . pantoprazole  40 mg Oral Daily  . predniSONE  10 mg Oral Daily  . vitamin B-12  1,000 mcg Oral BID  . warfarin  7.5 mg Oral ONCE-1800  . Warfarin - Pharmacist Dosing Inpatient   Does not apply q1800   Continuous Infusions: . sodium chloride 10 mL/hr at 04/02/12 0600  . heparin 1,150 Units/hr (04/04/12 1206)  . nitroGLYCERIN Stopped (03/31/12 0200)   PRN  Meds:.acetaminophen, ALPRAZolam, dextrose, dextrose, fentaNYL, gabapentin, HYDROcodone-acetaminophen, nitroGLYCERIN, ondansetron (ZOFRAN) IV, temazepam  GHERGHE, COSTIN If 7PM-7AM, please contact night-coverage [email or web address] Password [email or web address] 04/04/2012, 1:22 PM   LOS: 5 days

## 2012-04-04 NOTE — Progress Notes (Signed)
CARDIAC REHAB PHASE I   PRE:  Rate/Rhythm: 64 SR  BP:  Supine:   Sitting: 101/45  Standing:    SaO2: 99 RA  MODE:  Ambulation: 350 ft   POST:  Rate/Rhythm: 84  BP:  Supine:   Sitting: 114/50  Standing:    SaO2:  1350-1440 Assisted X 1 and used walker to ambulate. Gait steady with walker. Pt denies any SOB or cp. VS stable. Completed discharge education with pt. She declines Outpt. CRP, states that her joint pain will not let her use machines to exercise.Pt back to recliner after walk with call light in reach.  [name] RN 04/04/2012 2:43 PM

## 2012-04-04 NOTE — Progress Notes (Addendum)
ANTICOAGULATION CONSULT NOTE - Follow Up Consult  Pharmacy Consult for heparin and warfarin Indication:  H/o AVR  Allergies  Allergen Reactions  . Shellfish Allergy Anaphylaxis  . Codeine     Feels sick    Patient Measurements: Height: 5\' 3"  (160 cm) Weight: 191 lb (86.637 kg) IBW/kg (Calculated) : 52.4 Heparin Dosing Weight: 71kg  Vital Signs: Temp: 98.3 F (36.8 C) (03/18 0357) Temp src: Oral (03/18 0357) BP: 103/42 mmHg (03/18 0900) Pulse Rate: 63 (03/18 0900)  Labs:  Recent Labs  04/02/12 0525  04/02/12 2231 04/03/12 0512 04/03/12 0910 04/04/12 0425  HGB 10.6*  --   --  9.9*  --  9.9*  HCT 32.1*  --   --  30.3*  --  29.6*  PLT 375  --   --  317  --  319  LABPROT  --   --   --   --  18.4* 18.0*  INR  --   --   --   --  1.58* 1.54*  HEPARINUNFRC 0.38  < > 0.33 0.45  --  0.69  CREATININE 1.34*  --   --   --  2.28* 1.96*  < > = values in this interval not displayed.  Estimated Creatinine Clearance: 27.5 ml/min (by C-G formula based on Cr of 1.96).  Assessment: 72 y/o female patient with history of CAD s/p CABG and aortic valve replacement in 1991 and redo bypass in 2005 on chronic warfarin who was admitted for SOB.   Heparin level is therapeutic but on higher side of goal, will reduce rate. INR not moving after 2 doses of 5mg , will increase dose.  PTA warfarin: 5mg  on Mondays and Fridays and 2.5mg  all other days.  Goal of Therapy:  Heparin level 0.3-0.7 units/ml Monitor platelets by anticoagulation protocol: Yes  INR=2-3  Plan:  Decrease heparin gtt to 1150 units/hr, coumadin 7.5mg  today and f/u daily labs.  Verlene Mayer, PharmD, BCPS Pager [phone]  04/04/2012 10:21 AM

## 2012-04-05 LAB — BASIC METABOLIC PANEL
BUN: 50 mg/dL — ABNORMAL HIGH (ref 6–23)
CO2: 27 mEq/L (ref 19–32)
Calcium: 8.5 mg/dL (ref 8.4–10.5)
Chloride: 102 mEq/L (ref 96–112)
Creatinine, Ser: 1.26 mg/dL — ABNORMAL HIGH (ref 0.50–1.10)
GFR calc Af Amer: 48 mL/min — ABNORMAL LOW (ref >90)
GFR calc non Af Amer: 42 mL/min — ABNORMAL LOW (ref >90)
Glucose, Bld: 104 mg/dL — ABNORMAL HIGH (ref 70–99)
Potassium: 4.4 mEq/L (ref 3.5–5.1)
Sodium: 138 mEq/L (ref 135–145)

## 2012-04-05 LAB — GLUCOSE, CAPILLARY
Glucose-Capillary: 161 mg/dL — ABNORMAL HIGH (ref 70–99)
Glucose-Capillary: 158 mg/dL — ABNORMAL HIGH (ref 70–99)

## 2012-04-05 LAB — CBC
HCT: 29.2 % — ABNORMAL LOW (ref 36.0–46.0)
Hemoglobin: 9.7 g/dL — ABNORMAL LOW (ref 12.0–15.0)
MCH: 32.6 pg (ref 26.0–34.0)
MCHC: 33.2 g/dL (ref 30.0–36.0)
MCV: 98 fL (ref 78.0–100.0)
Platelets: 298 10*3/uL (ref 150–400)
RBC: 2.98 MIL/uL — ABNORMAL LOW (ref 3.87–5.11)
RDW: 14.1 % (ref 11.5–15.5)
WBC: 7.8 10*3/uL (ref 4.0–10.5)

## 2012-04-05 LAB — PROTIME-INR
INR: 2.04 — ABNORMAL HIGH (ref 0.00–1.49)
Prothrombin Time: 22.2 seconds — ABNORMAL HIGH (ref 11.6–15.2)

## 2012-04-05 LAB — HEPARIN LEVEL (UNFRACTIONATED): Heparin Unfractionated: 0.61 IU/mL (ref 0.30–0.70)

## 2012-04-05 MED ORDER — METOPROLOL SUCCINATE ER 25 MG PO TB24
25.0000 mg | ORAL_TABLET | Freq: Two times a day (BID) | ORAL | Status: DC
  Administered 2012-04-06 – 2012-04-07 (×3): 25 mg via ORAL
  Filled 2012-04-05 (×4): qty 1

## 2012-04-05 MED ORDER — FENTANYL 25 MCG/HR TD PT72
25.0000 ug | MEDICATED_PATCH | TRANSDERMAL | Status: DC
  Administered 2012-04-05: 25 ug via TRANSDERMAL
  Filled 2012-04-05: qty 1

## 2012-04-05 MED ORDER — WARFARIN SODIUM 5 MG PO TABS
5.0000 mg | ORAL_TABLET | Freq: Once | ORAL | Status: AC
  Administered 2012-04-05: 5 mg via ORAL
  Filled 2012-04-05: qty 1

## 2012-04-05 NOTE — Progress Notes (Signed)
ANTICOAGULATION CONSULT NOTE - Follow Up Consult  Pharmacy Consult for heparin and warfarin Indication:  H/o AVR  Allergies  Allergen Reactions  . Shellfish Allergy Anaphylaxis  . Codeine     Feels sick    Patient Measurements: Height: 5\' 3"  (160 cm) Weight: 191 lb 5.8 oz (86.8 kg) IBW/kg (Calculated) : 52.4 Heparin Dosing Weight: 71kg  Vital Signs: Temp: 97.7 F (36.5 C) (03/19 0609) Temp src: Oral (03/19 0609) BP: 106/41 mmHg (03/19 0609) Pulse Rate: 62 (03/19 0609)  Labs:  Recent Labs  04/03/12 0512 04/03/12 0910 04/04/12 0425 04/05/12 0525  HGB 9.9*  --  9.9* 9.7*  HCT 30.3*  --  29.6* 29.2*  PLT 317  --  319 298  LABPROT  --  18.4* 18.0* 22.2*  INR  --  1.58* 1.54* 2.04*  HEPARINUNFRC 0.45  --  0.69 0.61  CREATININE  --  2.28* 1.96* 1.26*    Estimated Creatinine Clearance: 42.8 ml/min (by C-G formula based on Cr of 1.26).  Assessment: 72 y/o female patient with history of CAD s/p CABG and aortic valve replacement in 1991 and redo bypass in 2005 on chronic warfarin who was admitted for SOB.   Heparin level is therapeutic and INR is as well. Ok to d/c heparin and continue coumadin. Large increase INR, will decrease dose closer to home dose.  PTA warfarin: 5mg  on Mondays and Fridays and 2.5mg  all other days.  Goal of Therapy:  Monitor platelets by anticoagulation protocol: Yes  INR=2-3  Plan:  D/c heparin Coumadin 5mg  today F/u daily protime  Verlene Mayer, PharmD, BCPS Pager [phone]  04/05/2012 9:15 AM

## 2012-04-05 NOTE — Progress Notes (Addendum)
Tel shows junctional rhythm , pt oob in bathroom having bowel movement , replies she is not straining , asymptomatic assisted back to bed bp 104/54 HR 47 , 12 lead EKG obtained, rhythm Junctional . [facility] cardiology paged Egbert Garibaldi A

## 2012-04-05 NOTE — Progress Notes (Signed)
CARDIAC REHAB PHASE I   PRE:  Rate/Rhythm: 60SR  BP:  Supine:   Sitting: 100/50  Standing:    SaO2: 99%RA  MODE:  Ambulation: 350 ft   POST:  Rate/Rhythm: 73SR  BP:  Supine:   Sitting: 104/50  Standing:    SaO2: 97%RA 1052-1125 Pt walked 350 ft on RA with rolling walker and minimal assistance. Slow,steady gait. To bathroom and then to recliner. Call bell in reach. C/o rib pain but no CP. Needs rolling walker for home use.   Luetta Nutting, RN BSN  04/05/2012 11:18 AM

## 2012-04-05 NOTE — Progress Notes (Signed)
[facility]  Subjective: No complaints. Denies bleeding.    Objective: Vital signs in last 24 hours: Temp:  [97.3 F (36.3 C)-97.7 F (36.5 C)] 97.7 F (36.5 C) (03/19 0609) Pulse Rate:  [62-64] 62 (03/19 0609) Resp:  [18-20] 20 (03/19 0609) BP: (101-109)/(41-48) 106/41 mmHg (03/19 0609) SpO2:  [97 %-98 %] 97 % (03/19 0609) Weight:  [191 lb 5.8 oz (86.8 kg)] 191 lb 5.8 oz (86.8 kg) (03/19 0322) Last BM Date: 04/04/12  Intake/Output from previous day: 03/18 0701 - 03/19 0700 In: 1223 [P.O.:1080; I.V.:143] Out: 3050 [Urine:3050] Intake/Output this shift: Total I/O In: 360 [P.O.:360] Out: -   Medications Current Facility-Administered Medications  Medication Dose Route Frequency Provider Last Rate Last Dose  . 0.9 %  sodium chloride infusion   Intravenous Continuous Elease Etienne, MD 10 mL/hr at 04/02/12 0600    . acetaminophen (TYLENOL) tablet 650 mg  650 mg Oral Q4H PRN Abelino Derrick, PA-C      . ALPRAZolam Prudy Feeler) tablet 0.25 mg  0.25 mg Oral BID PRN Abelino Derrick, PA-C      . aspirin chewable tablet 81 mg  81 mg Oral Daily Runell Gess, MD   81 mg at 04/05/12 0945  . calcium carbonate (OS-CAL - dosed in mg of elemental calcium) tablet 500 mg of elemental calcium  1 tablet Oral BID WC Eda Paschal Kilroy, PA-C   500 mg of elemental calcium at 04/05/12 0727  . clopidogrel (PLAVIX) tablet 75 mg  75 mg Oral Q breakfast Corky Crafts, MD   75 mg at 04/05/12 0727  . dextrose 50 % solution 25 mL  25 mL Intravenous PRN Eda Paschal Kilroy, PA-C      . dextrose 50 % solution 25 mL  25 mL Intravenous PRN Elease Etienne, MD      . DULoxetine (CYMBALTA) DR capsule 60 mg  60 mg Oral Daily Abelino Derrick, PA-C   60 mg at 04/05/12 0944  . fenofibrate tablet 54 mg  54 mg Oral Daily Abelino Derrick, PA-C   54 mg at 04/05/12 0944  . fentaNYL (DURAGESIC - dosed mcg/hr) 12.5 mcg  12.5 mcg Transdermal Q72H Eda Paschal Kilroy, PA-C   12.5 mcg at 04/03/12 1112  .  fentaNYL (SUBLIMAZE) injection 25 mcg  25 mcg Intravenous Q1H PRN Mihai Croitoru, MD   25 mcg at 04/02/12 2359  . furosemide (LASIX) tablet 40 mg  40 mg Oral Daily Mihai Croitoru, MD   40 mg at 04/05/12 0944  . gabapentin (NEURONTIN) capsule 300 mg  300 mg Oral BID Brittainy Simmons, PA-C   300 mg at 04/05/12 0944  . gabapentin (NEURONTIN) capsule 300 mg  300 mg Oral Daily PRN Brittainy Simmons, PA-C      . HYDROcodone-acetaminophen (NORCO) 7.5-325 MG per tablet 1 tablet  1 tablet Oral Q4H PRN Robbie Lis, PA-C   1 tablet at 04/05/12 0727  . insulin aspart (novoLOG) injection 0-15 Units  0-15 Units Subcutaneous TID WC Russella Dar, NP   15 Units at 04/04/12 1649  . insulin aspart (novoLOG) injection 0-5 Units  0-5 Units Subcutaneous QHS Russella Dar, NP   2 Units at 04/04/12 2146  . insulin aspart (novoLOG) injection 3 Units  3 Units Subcutaneous TID WC Pamella Pert, MD   3 Units at 04/05/12 0727  . insulin glargine (LANTUS) injection 64 Units  64 Units Subcutaneous Q breakfast Corky Crafts, MD   64 Units  at 04/05/12 0806  . metoprolol succinate (TOPROL-XL) 24 hr tablet 50 mg  50 mg Oral BID Brittainy Simmons, PA-C   50 mg at 04/05/12 0945  . multivitamin with minerals tablet 1 tablet  1 tablet Oral Daily Abelino Derrick, PA-C   1 tablet at 04/05/12 0944  . mupirocin ointment (BACTROBAN) 2 %   Topical BID Eda Paschal Kilroy, PA-C      . nitroGLYCERIN (NITROSTAT) SL tablet 0.4 mg  0.4 mg Sublingual Q5 Min x 3 PRN Eda Paschal Kilroy, PA-C      . ondansetron [facility]) injection 4 mg  4 mg Intravenous Q6H PRN Abelino Derrick, PA-C   4 mg at 04/01/12 1412  . pantoprazole (PROTONIX) EC tablet 40 mg  40 mg Oral Daily Abelino Derrick, PA-C   40 mg at 04/05/12 0945  . predniSONE (DELTASONE) tablet 10 mg  10 mg Oral Daily Corky Crafts, MD   10 mg at 04/05/12 0944  . temazepam (RESTORIL) capsule 30 mg  30 mg Oral QHS PRN Abelino Derrick, PA-C   30 mg at 04/04/12 2343  . vitamin B-12 (CYANOCOBALAMIN)  tablet 1,000 mcg  1,000 mcg Oral BID Abelino Derrick, PA-C   1,000 mcg at 04/05/12 0945  . warfarin (COUMADIN) tablet 5 mg  5 mg Oral ONCE-1800 Runell Gess, MD      . Warfarin - Pharmacist Dosing Inpatient   Does not apply q1800 Lauren Bajbus, RPH        PE: General appearance: alert, cooperative and no distress Lungs: clear to auscultation bilaterally Heart: regular rate and rhythm and crisp mechanic valve sounds Extremities: 1+ LEE Pulses: 2+ and symmetric Skin: warm and dry Neurologic: Grossly normal  Lab Results:   Recent Labs  04/03/12 0512 04/04/12 0425 04/05/12 0525  WBC 9.7 8.8 7.8  HGB 9.9* 9.9* 9.7*  HCT 30.3* 29.6* 29.2*  PLT 317 319 298   BMET  Recent Labs  04/03/12 0910 04/04/12 0425 04/05/12 0525  NA 130* 136 138  K 4.7 5.0 4.4  CL 94* 97 102  CO2 27 24 27   GLUCOSE 180* 129* 104*  BUN 53* 64* 50*  CREATININE 2.28* 1.96* 1.26*  CALCIUM 8.0* 8.3* 8.5   PT/INR  Recent Labs  04/03/12 0910 04/04/12 0425 04/05/12 0525  LABPROT 18.4* 18.0* 22.2*  INR 1.58* 1.54* 2.04*   Assessment/Plan  Principal Problem:   NSTEMI, secondary to demand ischemia, Troponin >7 Active Problems:   CAD, CABG 1991, redo 2006, S/P DES to Left Main 04/01/12   H/O mechanical aortic valve replacement, St Jude 1991   Chronic anticoagulation with Coumadin   Dyslipidemia   HTN (hypertension)   Cardiomyopathy, ischemic, EF 35-40% 2D 03/31/12   Type 2 diabetes mellitus, uncontrolled   Hyperkalemia- 03/30/12- resolved   Acute renal failure after cath 04/01/12   Acute pulmonary edema   Cardiac arrest 04/01/12   Torsades de pointes 04/01/12   Depression- on Remron and Cymbalta prior to admission 03/30/12- combination may have contributed to Torade, prolonged QTc   Rheumatoid arthritis- chronic steroids  Plan: S/P PCI and stenting to the LM with DES by Dr. Eldridge Dace. POD#4. On ASA + Plavix. Coumadin was restarted yesterday for mechanical aortic valve. Heparin was D/C. INR today is  2.04. Therapeutic INR goal for mechanical valves is 2.5-3.5. She can be discharged home once INR is therapeutic. She is otherwise stable. HR and BP stable. H/H is fairly stable at 9.7/29.2 (9.9/29.6 yesterday). Continue to monitor H/H  as she is on DAP + heparin and    LOS: 6 days    Brittainy M. Delmer Islam 04/05/2012 11:51 AM   Patient seen and examined. Agree with assessment and plan. No recurrent chest pain or arrythmia. S/P DES stenting to LM into LCX, now on DAP plus coumadin. Agree with DC heparin even though INR not 2.5 due to bleeding risk of ASA, Plavix, Coumadin and heparin with INR 2.04. Crisp valve click. No bleeding. Probable DC tomorrow if stable.   Lennette Bihari, MD, [facility] 04/05/2012 12:18 PM

## 2012-04-05 NOTE — Progress Notes (Addendum)
[facility] Consultation F/U Note   [name] XBJ:[id number] DOB: 07/12/1940 DOA: 03/30/2012 PCP: Londell Moh, MD  Brief narrative: 72 year old female patient with history of DM 2/IDDM on a "Lantus study" for the last 1 year, HTN, HL intolerant of statins, CAD status post CABG and PCI, chronic systolic CHF presented to ED on 03/30/12 with acute onset of dyspnea. She claims compliance with medications but not sure of dietary compliance. At 8:30 PM the night before admission, she started having worsening dyspnea but without any chest pain or palpitations. She presented to the ED where code STEMI was called which was canceled by cardiology. She was found to be in decompensated CHF. IV Lasix was started with good effect. Since admission the patient indicated that she was feeling much better with significant improvement in her dyspnea. Her lab work showed potassium 5.7, creatinine 1.4 and blood glucose of 443 mg/dL. Patient claimed compliance to her Lantus with home CBGs range between 125-140 mg/dL. She endorsed that blood sugars less than 90 mg/dL cause symptomatic hypoglycemia. The hospitalist service was requested to assist in her diabetes management.  Assessment/Plan:  Acute pulmonary edema due to decompensated CHF/Cardiomyopathy, ischemic, EF 35-40% 2D Nov 2011 -per Cards  NSTEMI, secondary to demand ischemia, Troponin >7 /history of CAD, CABG 1991, redo 2006, last cath Dec 2011, medical Rx -s/p cath and stent  Type 2 diabetes mellitus, uncontrolled -she explains has recently been enrolled in a Lantus study thru her PCP ofc-  dose was 63 units daily and last HgbA1c one week ago was elevated at 9.1 - fasting sugars OK this morning but trends up during daytime, may need more mealtime coverage.  - complicating since she is on Prednisone.  - will add standing mealtime coverage 3U plus SSI. - glucose much better controlled, did not increase as much during daytime. No changes  today.   Torsades/ Prolonged QT -Being managed by Cardiology  Acute renal insufficiency, SCr 1.4 on admission/ Hyperkalemia - improved initially, but crt now climbing - will need to follow - timing is suspicious for contrast induced nephropathy - STOP ACE for now  - renal function continues to improve.   HTN (hypertension) -controlled  Leukocytosis -on chronic steroids  Chronic Steroids - she is on this for her chronic RA. She is also on Methotrexate 15 mg weekly and her PCP has been having difficulties weaning her Prednisone. Once she goes less than 10 mg, she is having a flare-up with bilateral hand small joint swelling and pain as well as prolonged stiffness in the morning.   H/O mechanical aortic valve replacement, St Jude 1991/Chronic anticoagulation with Coumadin  -per Cards/pharmacy  Depression  Cymbalta and Remeron d/c'd by cardiology due to QT prolongation  Dyslipidemia  DVT prophylaxis: heparin infusion Code Status: Full Family Communication: Patient  Procedures: 2-D echocardiogram (03/31/12)  Study Conclusions - Left ventricle: The cavity size was normal. Wall thickness was increased in a pattern of mild LVH. Systolic function was moderately reduced. The estimated ejection fraction was in the range of 35% to 40%. Moderate hypokinesis of the mid-distalanteroseptal, anterior, and apical myocardium. Moderate hypokinesis of the basal-midinferior and inferoseptal myocardium. Features are consistent with a pseudonormal left ventricular filling pattern, with concomitant abnormal relaxation and increased filling pressure (grade 2 diastolic dysfunction). Doppler parameters are consistent with elevated mean left atrial filling pressure. - Ventricular septum: Septal motion showed paradox. - Aortic valve: A mechanical prosthesis was present and functioning normally. Trivial regurgitation. Valve area: 1.19cm^2(VTI). Valve area: 2.9cm^2 (Vmean). -  Mitral valve: Calcified  annulus. Mildly thickened leaflets .- Left atrium: The atrium was moderately dilated. - Atrial septum: The septum bowed from left to right, consistent with increased left atrial pressure. Impressions: - Compared to 12/2011, there appears to be a new inferior wall motion abnormality and the overall LV systolic function has diminished.  Antibiotics: None  HPI/Subjective: - no complaints, is walking with PT in the hallway.   Objective: Blood pressure 106/41, pulse 62, temperature 97.7 F (36.5 C), temperature source Oral, resp. rate 20, height 5\' 3"  (1.6 m), weight 86.8 kg (191 lb 5.8 oz), SpO2 97.00%.  Intake/Output Summary (Last 24 hours) at 04/05/12 1100 Last data filed at 04/05/12 0735  Gross per 24 hour  Intake   1223 ml  Output   2550 ml  Net  -1327 ml   Exam: General: No acute respiratory distress Lungs: Clear to auscultation bilaterally without wheezes or crackles, RA , tender over lateral chest wall bilaterally Cardiovascular: Regular rate and rhythm without murmur gallop or rub normal S1 and S2 Abdomen: Nontender, nondistended, soft, bowel sounds positive, no rebound, no ascites, no appreciable mass Extremities: No significant cyanosis, clubbing, or edema bilateral lower extremities  Data Reviewed: Basic Metabolic Panel:  Recent Labs Lab 03/31/12 0500 04/01/12 0108 04/02/12 0525 04/03/12 0910 04/04/12 0425 04/05/12 0525  NA 140 133* 136 130* 136 138  K 4.1 3.9 4.2 4.7 5.0 4.4  CL 99 92* 96 94* 97 102  CO2 29 26 28 27 24 27   GLUCOSE 158* 228* 236* 180* 129* 104*  BUN 40* 39* 34* 53* 64* 50*  CREATININE 1.26* 1.35* 1.34* 2.28* 1.96* 1.26*  CALCIUM 9.3 9.8 8.5 8.0* 8.3* 8.5  MG  --  2.2  --   --   --   --   PHOS  --  3.2  --   --   --   --    Liver Function Tests:  Recent Labs Lab 03/30/12 2116 03/31/12 0500  AST 45* 54*  ALT 23 22  ALKPHOS 54 48  BILITOT 0.6 0.5  PROT 7.2 6.5  ALBUMIN 3.7 3.4*   CBC:  Recent Labs Lab 04/01/12 0108  04/02/12 0525 04/03/12 0512 04/04/12 0425 04/05/12 0525  WBC 11.8* 10.4 9.7 8.8 7.8  HGB 12.8 10.6* 9.9* 9.9* 9.7*  HCT 38.8 32.1* 30.3* 29.6* 29.2*  MCV 101.0* 101.3* 99.0 98.3 98.0  PLT 445* 375 317 319 298   Cardiac Enzymes:  Recent Labs Lab 03/30/12 2116 03/31/12 0500 04/01/12 0104  TROPONINI 1.11* 7.82* 2.73*   BNP (last 3 results)  Recent Labs  03/30/12 2116  PROBNP 2717.0*   CBG:  Recent Labs Lab 04/04/12 0619 04/04/12 1133 04/04/12 1626 04/04/12 2141 04/05/12 0610  GLUCAP 119* 195* 370* 222* 101*    Recent Results (from the past 240 hour(s))  MRSA PCR SCREENING     Status: None   Collection Time    03/31/12  1:50 AM      Result Value Range Status   MRSA by PCR NEGATIVE  NEGATIVE Final   Comment:            The GeneXpert MRSA Assay (FDA     approved for NASAL specimens     only), is one component of a     comprehensive MRSA colonization     surveillance program. It is not     intended to diagnose MRSA     infection nor to guide or     monitor treatment for  MRSA infections.     Studies:  Recent x-ray studies have been reviewed in detail by the Attending Physician  Scheduled Meds: Scheduled Meds: . aspirin  81 mg Oral Daily  . calcium carbonate  1 tablet Oral BID WC  . clopidogrel  75 mg Oral Q breakfast  . DULoxetine  60 mg Oral Daily  . fenofibrate  54 mg Oral Daily  . fentaNYL  12.5 mcg Transdermal Q72H  . furosemide  40 mg Oral Daily  . gabapentin  300 mg Oral BID  . insulin aspart  0-15 Units Subcutaneous TID WC  . insulin aspart  0-5 Units Subcutaneous QHS  . insulin aspart  3 Units Subcutaneous TID WC  . insulin glargine  64 Units Subcutaneous Q breakfast  . metoprolol succinate  50 mg Oral BID  . multivitamin with minerals  1 tablet Oral Daily  . mupirocin ointment   Topical BID  . pantoprazole  40 mg Oral Daily  . predniSONE  10 mg Oral Daily  . vitamin B-12  1,000 mcg Oral BID  . warfarin  5 mg Oral ONCE-1800  .  Warfarin - Pharmacist Dosing Inpatient   Does not apply q1800   Continuous Infusions: . sodium chloride 10 mL/hr at 04/02/12 0600   PRN Meds:.acetaminophen, ALPRAZolam, dextrose, dextrose, fentaNYL, gabapentin, HYDROcodone-acetaminophen, nitroGLYCERIN, ondansetron (ZOFRAN) IV, temazepam  Margarita Croke If 7PM-7AM, please contact night-coverage [email or web address] Password [email or web address] 04/05/2012, 11:00 AM   LOS: 6 days

## 2012-04-05 NOTE — Progress Notes (Signed)
Pt complaint of pain rt.posterior upper shoulder pain, pt states feels like a knot. Increased pain with movement. kpad in place. Denies sob, chest pain skin warm and dry. Pt states that about hour after new fentanyl patch applied, she felt "sort of like i had a shot for pain got a little moist feeling".  Dr. Rennis Golden made aware of concerns, orders received. Will follow pt closely for further changes. Encouraged pt to alert RN.  Pt OOB in chair at present Egbert Garibaldi A

## 2012-04-06 ENCOUNTER — Ambulatory Visit: Payer: Self-pay | Admitting: Cardiovascular Disease

## 2012-04-06 ENCOUNTER — Inpatient Hospital Stay (HOSPITAL_COMMUNITY): Payer: Medicare Other

## 2012-04-06 DIAGNOSIS — R0789 Other chest pain: Secondary | ICD-10-CM | POA: Diagnosis not present

## 2012-04-06 DIAGNOSIS — Z7901 Long term (current) use of anticoagulants: Secondary | ICD-10-CM

## 2012-04-06 DIAGNOSIS — Z952 Presence of prosthetic heart valve: Secondary | ICD-10-CM

## 2012-04-06 LAB — PROTIME-INR
INR: 2.3 — ABNORMAL HIGH (ref 0.00–1.49)
Prothrombin Time: 24.3 seconds — ABNORMAL HIGH (ref 11.6–15.2)

## 2012-04-06 LAB — GLUCOSE, CAPILLARY
Glucose-Capillary: 98 mg/dL (ref 70–99)
Glucose-Capillary: 124 mg/dL — ABNORMAL HIGH (ref 70–99)
Glucose-Capillary: 142 mg/dL — ABNORMAL HIGH (ref 70–99)
Glucose-Capillary: 232 mg/dL — ABNORMAL HIGH (ref 70–99)

## 2012-04-06 MED ORDER — HYDROMORPHONE HCL 2 MG PO TABS
1.0000 mg | ORAL_TABLET | ORAL | Status: DC | PRN
  Administered 2012-04-06 – 2012-04-07 (×5): 1 mg via ORAL
  Filled 2012-04-06 (×5): qty 1

## 2012-04-06 MED ORDER — INSULIN GLARGINE 100 UNIT/ML ~~LOC~~ SOLN
60.0000 [IU] | Freq: Every day | SUBCUTANEOUS | Status: DC
  Administered 2012-04-07: 60 [IU] via SUBCUTANEOUS
  Filled 2012-04-06 (×2): qty 0.6

## 2012-04-06 MED ORDER — DICLOFENAC EPOLAMINE 1.3 % TD PTCH
1.0000 | MEDICATED_PATCH | Freq: Two times a day (BID) | TRANSDERMAL | Status: DC
  Administered 2012-04-06 – 2012-04-07 (×3): 1 via TRANSDERMAL
  Filled 2012-04-06 (×6): qty 1

## 2012-04-06 MED ORDER — WARFARIN SODIUM 5 MG PO TABS
5.0000 mg | ORAL_TABLET | Freq: Once | ORAL | Status: AC
  Administered 2012-04-06: 5 mg via ORAL
  Filled 2012-04-06: qty 1

## 2012-04-06 NOTE — Progress Notes (Signed)
[facility] Consultation F/U Note   ASANTE RITACCO VWU:[id number] DOB: 02-11-1940 DOA: 03/30/2012 PCP: Londell Moh, MD  Brief narrative: 72 year old female patient with history of DM 2/IDDM on a "Lantus study" for the last 1 year, HTN, HL intolerant of statins, CAD status post CABG and PCI, chronic systolic CHF presented to ED on 03/30/12 with acute onset of dyspnea. She claims compliance with medications but not sure of dietary compliance. At 8:30 PM the night before admission, she started having worsening dyspnea but without any chest pain or palpitations. She presented to the ED where code STEMI was called which was canceled by cardiology. She was found to be in decompensated CHF. IV Lasix was started with good effect. Since admission the patient indicated that she was feeling much better with significant improvement in her dyspnea. Her lab work showed potassium 5.7, creatinine 1.4 and blood glucose of 443 mg/dL. Patient claimed compliance to her Lantus with home CBGs range between 125-140 mg/dL. She endorsed that blood sugars less than 90 mg/dL cause symptomatic hypoglycemia. The hospitalist service was requested to assist in her diabetes management.  Assessment/Plan:  Acute pulmonary edema due to decompensated CHF/Cardiomyopathy, ischemic, EF 35-40% 2D Nov 2011 -per Cards  NSTEMI, secondary to demand ischemia, Troponin >7 /history of CAD, CABG 1991, redo 2006, last cath Dec 2011, medical Rx -s/p cath and stent  Type 2 diabetes mellitus, uncontrolled - she explains has recently been enrolled in a Lantus study thru her PCP ofc-  dose was 63 units daily and last HgbA1c one week ago was elevated at 9.1 - fasting sugars OK this morning but trends up during daytime, may need more mealtime coverage.  - complicating since she is on Prednisone.  - will add standing mealtime coverage 3U plus SSI. - glucose much better controlled, did not increase as much during daytime. Lantus to 60  daily since she reported earlier that she is somewhat symptomatic with normal sugars and will be on increased mealtime coverage at home. Will provide patient with instructions about sliding scale in d/c instructions.   Torsades/ Prolonged QT -Being managed by Cardiology  Acute renal insufficiency, SCr 1.4 on admission/ Hyperkalemia - improved initially, but crt now climbing - will need to follow - timing is suspicious for contrast induced nephropathy - STOP ACE for now  - renal function continues to improve.   HTN (hypertension) -controlled  Leukocytosis -on chronic steroids  Chronic Steroids - she is on this for her chronic RA. She is also on Methotrexate 15 mg weekly and her PCP has been having difficulties weaning her Prednisone. Once she goes less than 10 mg, she is having a flare-up with bilateral hand small joint swelling and pain as well as prolonged stiffness in the morning.   H/O mechanical aortic valve replacement, St Jude 1991/Chronic anticoagulation with Coumadin  -per Cards/pharmacy  Depression  Cymbalta and Remeron d/c'd by cardiology due to QT prolongation  Dyslipidemia  DVT prophylaxis: heparin infusion Code Status: Full Family Communication: Patient  Procedures: 2-D echocardiogram (03/31/12)  Study Conclusions - Left ventricle: The cavity size was normal. Wall thickness was increased in a pattern of mild LVH. Systolic function was moderately reduced. The estimated ejection fraction was in the range of 35% to 40%. Moderate hypokinesis of the mid-distalanteroseptal, anterior, and apical myocardium. Moderate hypokinesis of the basal-midinferior and inferoseptal myocardium. Features are consistent with a pseudonormal left ventricular filling pattern, with concomitant abnormal relaxation and increased filling pressure (grade 2 diastolic dysfunction). Doppler parameters are consistent with  elevated mean left atrial filling pressure. - Ventricular septum: Septal  motion showed paradox. - Aortic valve: A mechanical prosthesis was present and functioning normally. Trivial regurgitation. Valve area: 1.19cm^2(VTI). Valve area: 2.9cm^2 (Vmean). - Mitral valve: Calcified annulus. Mildly thickened leaflets .- Left atrium: The atrium was moderately dilated. - Atrial septum: The septum bowed from left to right, consistent with increased left atrial pressure. Impressions: - Compared to 12/2011, there appears to be a new inferior wall motion abnormality and the overall LV systolic function has diminished.  Antibiotics: None  HPI/Subjective: - no complaints, is walking with PT in the hallway.   Objective: Blood pressure 167/56, pulse 80, temperature 97.5 F (36.4 C), temperature source Oral, resp. rate 20, height 5\' 3"  (1.6 m), weight 86.1 kg (189 lb 13.1 oz), SpO2 99.00%.  Intake/Output Summary (Last 24 hours) at 04/06/12 0812 Last data filed at 04/06/12 0734  Gross per 24 hour  Intake    600 ml  Output   1400 ml  Net   -800 ml   Exam: General: No acute respiratory distress Lungs: Clear to auscultation bilaterally without wheezes or crackles, RA , tender over lateral chest wall bilaterally Cardiovascular: Regular rate and rhythm without murmur gallop or rub normal S1 and S2 Chest/back: tenderness subscapular, left, worse with palpation Abdomen: Nontender, nondistended, soft, bowel sounds positive, no rebound, no ascites, no appreciable mass Extremities: No significant cyanosis, clubbing, or edema bilateral lower extremities  Data Reviewed: Basic Metabolic Panel:  Recent Labs Lab 03/31/12 0500 04/01/12 0108 04/02/12 0525 04/03/12 0910 04/04/12 0425 04/05/12 0525  NA 140 133* 136 130* 136 138  K 4.1 3.9 4.2 4.7 5.0 4.4  CL 99 92* 96 94* 97 102  CO2 29 26 28 27 24 27   GLUCOSE 158* 228* 236* 180* 129* 104*  BUN 40* 39* 34* 53* 64* 50*  CREATININE 1.26* 1.35* 1.34* 2.28* 1.96* 1.26*  CALCIUM 9.3 9.8 8.5 8.0* 8.3* 8.5  MG  --  2.2  --    --   --   --   PHOS  --  3.2  --   --   --   --    Liver Function Tests:  Recent Labs Lab 03/30/12 2116 03/31/12 0500  AST 45* 54*  ALT 23 22  ALKPHOS 54 48  BILITOT 0.6 0.5  PROT 7.2 6.5  ALBUMIN 3.7 3.4*   CBC:  Recent Labs Lab 04/01/12 0108 04/02/12 0525 04/03/12 0512 04/04/12 0425 04/05/12 0525  WBC 11.8* 10.4 9.7 8.8 7.8  HGB 12.8 10.6* 9.9* 9.9* 9.7*  HCT 38.8 32.1* 30.3* 29.6* 29.2*  MCV 101.0* 101.3* 99.0 98.3 98.0  PLT 445* 375 317 319 298   Cardiac Enzymes:  Recent Labs Lab 03/30/12 2116 03/31/12 0500 04/01/12 0104  TROPONINI 1.11* 7.82* 2.73*   BNP (last 3 results)  Recent Labs  03/30/12 2116  PROBNP 2717.0*   CBG:  Recent Labs Lab 04/05/12 0610 04/05/12 1129 04/05/12 1619 04/05/12 2056 04/06/12 0600  GLUCAP 101* 161* 164* 158* 98    Recent Results (from the past 240 hour(s))  MRSA PCR SCREENING     Status: None   Collection Time    03/31/12  1:50 AM      Result Value Range Status   MRSA by PCR NEGATIVE  NEGATIVE Final   Comment:            The GeneXpert MRSA Assay (FDA     approved for NASAL specimens     only), is one component  of a     comprehensive MRSA colonization     surveillance program. It is not     intended to diagnose MRSA     infection nor to guide or     monitor treatment for     MRSA infections.     Studies:  Recent x-ray studies have been reviewed in detail by the Attending Physician  Scheduled Meds: Scheduled Meds: . aspirin  81 mg Oral Daily  . calcium carbonate  1 tablet Oral BID WC  . clopidogrel  75 mg Oral Q breakfast  . DULoxetine  60 mg Oral Daily  . fenofibrate  54 mg Oral Daily  . furosemide  40 mg Oral Daily  . gabapentin  300 mg Oral BID  . insulin aspart  0-15 Units Subcutaneous TID WC  . insulin aspart  0-5 Units Subcutaneous QHS  . insulin aspart  3 Units Subcutaneous TID WC  . insulin glargine  64 Units Subcutaneous Q breakfast  . metoprolol succinate  25 mg Oral BID  .  multivitamin with minerals  1 tablet Oral Daily  . mupirocin ointment   Topical BID  . pantoprazole  40 mg Oral Daily  . predniSONE  10 mg Oral Daily  . vitamin B-12  1,000 mcg Oral BID  . Warfarin - Pharmacist Dosing Inpatient   Does not apply q1800   Continuous Infusions: . sodium chloride 10 mL/hr at 04/02/12 0600   PRN Meds:.acetaminophen, ALPRAZolam, dextrose, dextrose, fentaNYL, gabapentin, HYDROcodone-acetaminophen, nitroGLYCERIN, ondansetron (ZOFRAN) IV, temazepam  Aashish Hamm If 7PM-7AM, please contact night-coverage [email or web address] Password [email or web address] 04/06/2012, 8:12 AM   LOS: 7 days

## 2012-04-06 NOTE — Progress Notes (Signed)
ANTICOAGULATION CONSULT NOTE - Follow Up Consult  Pharmacy Consult for warfarin Indication:  H/o AVR  Allergies  Allergen Reactions  . Shellfish Allergy Anaphylaxis  . Codeine     Feels sick    Patient Measurements: Height: 5\' 3"  (160 cm) Weight: 189 lb 13.1 oz (86.1 kg) IBW/kg (Calculated) : 52.4 Heparin Dosing Weight: 71kg  Vital Signs: Temp: 97.5 F (36.4 C) (03/20 0428) Temp src: Oral (03/20 0428) BP: 167/56 mmHg (03/20 0428) Pulse Rate: 80 (03/20 0428)  Labs:  Recent Labs  04/03/12 0910 04/04/12 0425 04/05/12 0525 04/06/12 0455  HGB  --  9.9* 9.7*  --   HCT  --  29.6* 29.2*  --   PLT  --  319 298  --   LABPROT 18.4* 18.0* 22.2* 24.3*  INR 1.58* 1.54* 2.04* 2.30*  HEPARINUNFRC  --  0.69 0.61  --   CREATININE 2.28* 1.96* 1.26*  --     Estimated Creatinine Clearance: 42.6 ml/min (by C-G formula based on Cr of 1.26).  Assessment: 72 y/o female patient with history of CAD s/p CABG and aortic valve replacement in 1991 and redo bypass in 2005 on chronic warfarin who was admitted for SOB.   INR is therapeutic this am and trend up nicely, attempt to resume home dose.  PTA warfarin: 5mg  on Mondays and Fridays and 2.5mg  all other days.  Goal of Therapy:  Monitor platelets by anticoagulation protocol: Yes  INR=2-3  Plan:  Repeat coumadin 5mg  today and f/u daily protime.  Verlene Mayer, PharmD, BCPS Pager [phone]  04/06/2012 9:07 AM

## 2012-04-06 NOTE — Progress Notes (Signed)
Subjective:  [name] is complaining of localized Rt post chest wall pain that is getting worse. There is localized tenderness and a pleuritic component. This am she reports SOB.  Objective:  Vital Signs in the last 24 hours: Temp:  [97.5 F (36.4 C)-98.2 F (36.8 C)] 97.5 F (36.4 C) (03/20 0428) Pulse Rate:  [48-89] 89 (03/20 1017) Resp:  [18-20] 20 (03/20 0428) BP: (103-167)/(37-56) 114/40 mmHg (03/20 1017) SpO2:  [97 %-99 %] 99 % (03/20 0428) Weight:  [86.1 kg (189 lb 13.1 oz)] 86.1 kg (189 lb 13.1 oz) (03/20 0428)  Intake/Output from previous day:  Intake/Output Summary (Last 24 hours) at 04/06/12 1124 Last data filed at 04/06/12 1030  Gross per 24 hour  Intake    840 ml  Output   2300 ml  Net  -1460 ml    Physical Exam: General appearance: alert, cooperative, mild distress and moderately obese Lungs: crackles over RML Heart: regular rate and rhythm and positive valve sounds   Rate: 90  Rhythm: normal sinus rhythm and PVCs  Lab Results:  Recent Labs  04/04/12 0425 04/05/12 0525  WBC 8.8 7.8  HGB 9.9* 9.7*  PLT 319 298    Recent Labs  04/04/12 0425 04/05/12 0525  NA 136 138  K 5.0 4.4  CL 97 102  CO2 24 27  GLUCOSE 129* 104*  BUN 64* 50*  CREATININE 1.96* 1.26*   No results found for this basename: TROPONINI, CK, MB,  in the last 72 hours Hepatic Function Panel No results found for this basename: PROT, ALBUMIN, AST, ALT, ALKPHOS, BILITOT, BILIDIR, IBILI,  in the last 72 hours No results found for this basename: CHOL,  in the last 72 hours  Recent Labs  04/06/12 0455  INR 2.30*    Imaging: Imaging results have been reviewed  Cardiac Studies:  Assessment/Plan:   Principal Problem:   NSTEMI, secondary to demand ischemia, Troponin >7  Active Problems:     Cardiac arrest 04/01/12 - CPR   Torsades de pointes 04/01/12   Chest wall pain- ? No acute rib fx by XR but symptoms have been getting worse as she is getting anticoagulated   CAD,  CABG 1991, redo 2006, S/P DES to Left Main 04/01/12   H/O mechanical aortic valve replacement, St Jude 1991   Chronic anticoagulation with Coumadin   Cardiomyopathy, ischemic, EF 35-40% 2D 03/31/12   Type 2 diabetes mellitus, uncontrolled   Acute renal failure after cath 04/01/12   Depression- on Remron and Cymbalta prior to admission 03/30/12- combination may have contributed to Torsade, prolonged QTc   Dyslipidemia   HTN (hypertension)   Hyperkalemia- 03/30/12- resolved   Rheumatoid arthritis- chronic steroids   Acute pulmonary edema on admission- resolved  Plan- CT w/o contrast to R/O pneumonia, localized bleed. She is not ready for discharge today.   Corine Shelter PA-C 04/06/2012, 11:24 AM   Patient seen and examined. Agree with assessment and plan. INR 2.3. Valve clicks crisp. Continues to have pleuritic CP. For CT today as noted above.   Lennette Bihari, MD, [facility] 04/06/2012 12:14 PM

## 2012-04-06 NOTE — Progress Notes (Signed)
1415 We have checked on pt twice today for walking. Pt still with right side and shoulder pain that is much worse than yesterday. CT scan done but no report yet. Pt is requiring frequent pain med. Will hold ambulation and follow up tomorrow. Emiliano Welshans DunlapRNBSN

## 2012-04-06 NOTE — Progress Notes (Signed)
PA called with results of CT; no new orders; will cont. To monitor.

## 2012-04-07 LAB — GLUCOSE, CAPILLARY
Glucose-Capillary: 150 mg/dL — ABNORMAL HIGH (ref 70–99)
Glucose-Capillary: 157 mg/dL — ABNORMAL HIGH (ref 70–99)
Glucose-Capillary: 98 mg/dL (ref 70–99)

## 2012-04-07 LAB — PROTIME-INR
INR: 2.84 — ABNORMAL HIGH (ref 0.00–1.49)
Prothrombin Time: 28.4 seconds — ABNORMAL HIGH (ref 11.6–15.2)

## 2012-04-07 MED ORDER — INSULIN ASPART 100 UNIT/ML ~~LOC~~ SOLN: 0.0000 [IU] | Freq: Three times a day (TID) | SUBCUTANEOUS | Status: DC

## 2012-04-07 MED ORDER — NITROGLYCERIN 0.4 MG SL SUBL: 0.4000 mg | SUBLINGUAL_TABLET | SUBLINGUAL | Status: DC | PRN

## 2012-04-07 MED ORDER — TEMAZEPAM 30 MG PO CAPS: 30.0000 mg | ORAL_CAPSULE | Freq: Every evening | ORAL | Status: DC | PRN

## 2012-04-07 MED ORDER — CLOPIDOGREL BISULFATE 75 MG PO TABS: 75.0000 mg | ORAL_TABLET | Freq: Every day | ORAL | Status: DC

## 2012-04-07 MED ORDER — FUROSEMIDE 40 MG PO TABS: 40.0000 mg | ORAL_TABLET | Freq: Every day | ORAL | Status: DC

## 2012-04-07 MED ORDER — HYDROMORPHONE HCL 2 MG PO TABS: 1.0000 mg | ORAL_TABLET | ORAL | Status: DC | PRN

## 2012-04-07 MED ORDER — METOPROLOL SUCCINATE ER 25 MG PO TB24: 25.0000 mg | ORAL_TABLET | Freq: Two times a day (BID) | ORAL | Status: DC

## 2012-04-07 MED ORDER — WARFARIN SODIUM 2.5 MG PO TABS
2.5000 mg | ORAL_TABLET | Freq: Once | ORAL | Status: DC
  Filled 2012-04-07: qty 1

## 2012-04-07 MED ORDER — DICLOFENAC EPOLAMINE 1.3 % TD PTCH: 1.0000 | MEDICATED_PATCH | Freq: Two times a day (BID) | TRANSDERMAL | Status: DC

## 2012-04-07 MED ORDER — INSULIN GLARGINE 100 UNIT/ML ~~LOC~~ SOLN: 60.0000 [IU] | Freq: Every day | SUBCUTANEOUS | Status: DC

## 2012-04-07 NOTE — Progress Notes (Signed)
ANTICOAGULATION CONSULT NOTE - Follow Up Consult  Pharmacy Consult for coumadin Indication: atrial fibrillation and St Jude AVR  Allergies  Allergen Reactions  . Shellfish Allergy Anaphylaxis  . Codeine     Feels sick    Patient Measurements: Height: 5\' 3"  (160 cm) Weight: 187 lb 9.8 oz (85.1 kg) IBW/kg (Calculated) : 52.4 Heparin Dosing Weight:   Vital Signs: Temp: 98.1 F (36.7 C) (03/21 0606) Temp src: Oral (03/21 0606) BP: 135/45 mmHg (03/21 0606) Pulse Rate: 67 (03/21 0606)  Labs:  Recent Labs  04/05/12 0525 04/06/12 0455 04/07/12 0523  HGB 9.7*  --   --   HCT 29.2*  --   --   PLT 298  --   --   LABPROT 22.2* 24.3* 28.4*  INR 2.04* 2.30* 2.84*  HEPARINUNFRC 0.61  --   --   CREATININE 1.26*  --   --     Estimated Creatinine Clearance: 42.3 ml/min (by C-G formula based on Cr of 1.26).   Medications:  Scheduled:  . aspirin  81 mg Oral Daily  . calcium carbonate  1 tablet Oral BID WC  . clopidogrel  75 mg Oral Q breakfast  . diclofenac  1 patch Transdermal BID  . DULoxetine  60 mg Oral Daily  . fenofibrate  54 mg Oral Daily  . furosemide  40 mg Oral Daily  . gabapentin  300 mg Oral BID  . insulin aspart  0-15 Units Subcutaneous TID WC  . insulin aspart  0-5 Units Subcutaneous QHS  . insulin aspart  3 Units Subcutaneous TID WC  . insulin glargine  60 Units Subcutaneous Q breakfast  . metoprolol succinate  25 mg Oral BID  . multivitamin with minerals  1 tablet Oral Daily  . mupirocin ointment   Topical BID  . pantoprazole  40 mg Oral Daily  . predniSONE  10 mg Oral Daily  . vitamin B-12  1,000 mcg Oral BID  . [COMPLETED] warfarin  5 mg Oral ONCE-1800  . Warfarin - Pharmacist Dosing Inpatient   Does not apply q1800  . [DISCONTINUED] insulin glargine  64 Units Subcutaneous Q breakfast   Infusions:  . sodium chloride 10 mL/hr at 04/02/12 0600    Assessment: 72 yo female with afib and St. Jude AVR is currently on therapeutic coumadin.  INR today is  2.84 from 2.3.  Home coumadin dose was 5mg  M&F and 2.5mg  other days. Goal of Therapy:  INR 2-3    Plan:  1) Since INR up from 2.84 to 2.3, will give 2.5mg  of coumadin instead. 2) If get discharge today, can resume home regimen starting tomorrow.  Chrisma Hurlock, Tsz-Yin 04/07/2012,8:19 AM

## 2012-04-07 NOTE — Progress Notes (Signed)
[facility] PROGRESS NOTE  HARJOT ZAVADIL GNF:[id number] DOB: May 16, 1940 DOA: 03/30/2012 PCP: Londell Moh, MD Brief narrative 72 year old female patient with history of DM 2/IDDM, HTN, HL intolerant of statins, CAD status post CABG and PCI, chronic systolic CHF presented to ED on 03/30/12 with acute onset of dyspnea. She was found to be in decompensated CHF. IV Lasix was started with good effect.  Her lab work showed potassium 5.7, creatinine 1.4 and blood glucose of 443 mg/dL. Patient claimed compliance to her Lantus with home CBGs range between 125-140 mg/dL. She endorsed that blood sugars less than 90 mg/dL cause symptomatic hypoglycemia. The hospitalist service was requested to assist in her diabetes management.   Assessment/Plan:   Type 2 diabetes mellitus, uncontrolled  - She was previously on a Lantus study PCP office and is currently on Lantus of 63 units daily and last HgbA1c one week ago was elevated at 9.1  - fasting sugars OK this morning but trends up during daytime. - complicating since she is on Prednisone.  - Added 3 units of aspart pre-meal and fingersticks are better controlled. Continue home dose of Lantus  Torsades/ Prolonged QT  -Being managed by Cardiology  Acute renal insufficiency, SCr 1.4 on admission/ Hyperkalemia  --Improved  Acute pulmonary edema due to decompensated CHF/ischemic Cardiomyopathy,  -EF 35-40% 2D Nov 2011  -Management per cardiology  NSTEMI,  secondary to demand ischemia,  Troponin >7  -s/p cath and stent   HTN (hypertension)  -BP controlled   Leukocytosis  -on chronic steroids her arthritis  H/O mechanical aortic valve replacement,  -On Chronic Coumadin . INR therapeutic  Depression  Cymbalta and Remeron d/c'd by cardiology due to QT prolongation     Patient planned for discharge home today. Medical consult will sign off     HPI/Subjective: Patient seen and examined this morning. Complain of some right scapular  pain.  Objective: Filed Vitals:   04/06/12 1017 04/06/12 1504 04/06/12 2002 04/07/12 0606  BP: 114/40 126/46 112/40 135/45  Pulse: 89 78 68 67  Temp:  97.4 F (36.3 C) 97.8 F (36.6 C) 98.1 F (36.7 C)  TempSrc:  Oral Oral Oral  Resp:  16 18 18   Height:      Weight:    85.1 kg (187 lb 9.8 oz)  SpO2:  93% 96%     Intake/Output Summary (Last 24 hours) at 04/07/12 1634 Last data filed at 04/07/12 0820  Gross per 24 hour  Intake    480 ml  Output   2500 ml  Net  -2020 ml   Filed Weights   04/05/12 0322 04/06/12 0428 04/07/12 0606  Weight: 86.8 kg (191 lb 5.8 oz) 86.1 kg (189 lb 13.1 oz) 85.1 kg (187 lb 9.8 oz)    Exam:   General:  Elderly female lying in bed in no acute distress  HEENT: No pallor, moist oral mucosa  CVS: Normal S1 and S2, no murmurs rub or gallop  Chest: Clear to vision bilaterally no added sounds  Abdomen: Soft, nontender, nondistended, bowel sounds present  Extremities: Warm, no edema  CNS: AAO x3    Data Reviewed: Basic Metabolic Panel:  Recent Labs Lab 04/01/12 0108 04/02/12 0525 04/03/12 0910 04/04/12 0425 04/05/12 0525  NA 133* 136 130* 136 138  K 3.9 4.2 4.7 5.0 4.4  CL 92* 96 94* 97 102  CO2 26 28 27 24 27   GLUCOSE 228* 236* 180* 129* 104*  BUN 39* 34* 53* 64* 50*  CREATININE 1.35* 1.34*  2.28* 1.96* 1.26*  CALCIUM 9.8 8.5 8.0* 8.3* 8.5  MG 2.2  --   --   --   --   PHOS 3.2  --   --   --   --    Liver Function Tests: No results found for this basename: AST, ALT, ALKPHOS, BILITOT, PROT, ALBUMIN,  in the last 168 hours No results found for this basename: LIPASE, AMYLASE,  in the last 168 hours No results found for this basename: AMMONIA,  in the last 168 hours CBC:  Recent Labs Lab 04/01/12 0108 04/02/12 0525 04/03/12 0512 04/04/12 0425 04/05/12 0525  WBC 11.8* 10.4 9.7 8.8 7.8  HGB 12.8 10.6* 9.9* 9.9* 9.7*  HCT 38.8 32.1* 30.3* 29.6* 29.2*  MCV 101.0* 101.3* 99.0 98.3 98.0  PLT 445* 375 317 319 298   Cardiac  Enzymes:  Recent Labs Lab 04/01/12 0104  TROPONINI 2.73*   BNP (last 3 results)  Recent Labs  03/30/12 2116  PROBNP 2717.0*   CBG:  Recent Labs Lab 04/06/12 1630 04/06/12 2133 04/07/12 0014 04/07/12 0604 04/07/12 1133  GLUCAP 142* 232* 150* 98 157*    Recent Results (from the past 240 hour(s))  MRSA PCR SCREENING     Status: None   Collection Time    03/31/12  1:50 AM      Result Value Range Status   MRSA by PCR NEGATIVE  NEGATIVE Final   Comment:            The GeneXpert MRSA Assay (FDA     approved for NASAL specimens     only), is one component of a     comprehensive MRSA colonization     surveillance program. It is not     intended to diagnose MRSA     infection nor to guide or     monitor treatment for     MRSA infections.     Studies: Ct Chest Wo Contrast  04/06/2012  *RADIOLOGY REPORT*  Clinical Data: Persistent right chest pain.  Recent cardiopulmonary resuscitation.  CT CHEST WITHOUT CONTRAST  Technique:  Multidetector CT imaging of the chest was performed following the standard protocol without IV contrast.  Comparison: None.  Findings: Small pleural effusions are seen bilaterally.  Mild diffuse granular pulmonary opacity is suspicious for mild pulmonary edema.  In addition, there are numerous small less than 1 cm pulmonary nodules bilaterally, and differential diagnosis includes pulmonary metastases or other infectious or inflammatory etiologies.  No lymphadenopathy identified within the thorax.  Mild to moderate cardiomegaly is seen as well as diffuse coronary artery calcification.  Mild ascending thoracic aortic aneurysm is seen measuring 4.1 cm.  No evidence of aneurysm leak or rupture.  No suspicious bone lesions or fractures are identified.  Prior CABG noted.  IMPRESSION:  1. 1.  Small bilateral pleural effusions and probable mild diffuse pulmonary edema. 2.  Numerous diffuse tiny less than 1 cm pulmonary nodules. Differential diagnosis includes  metastatic disease as well as other infectious or inflammatory etiologies. 3.  No evidence of lymphadenopathy. 4.  4.1 cm ascending thoracic aortic aneurysm.  No evidence of aneurysm leak or rupture. 5.  No definite rib fractures or other suspicious bone lesions identified.   Original Report Authenticated By: Myles Rosenthal, M.D.     Scheduled Meds: . aspirin  81 mg Oral Daily  . calcium carbonate  1 tablet Oral BID WC  . clopidogrel  75 mg Oral Q breakfast  . diclofenac  1 patch Transdermal BID  .  DULoxetine  60 mg Oral Daily  . fenofibrate  54 mg Oral Daily  . furosemide  40 mg Oral Daily  . gabapentin  300 mg Oral BID  . insulin aspart  0-15 Units Subcutaneous TID WC  . insulin aspart  0-5 Units Subcutaneous QHS  . insulin aspart  3 Units Subcutaneous TID WC  . insulin glargine  60 Units Subcutaneous Q breakfast  . metoprolol succinate  25 mg Oral BID  . multivitamin with minerals  1 tablet Oral Daily  . mupirocin ointment   Topical BID  . pantoprazole  40 mg Oral Daily  . predniSONE  10 mg Oral Daily  . vitamin B-12  1,000 mcg Oral BID  . warfarin  2.5 mg Oral ONCE-1800  . Warfarin - Pharmacist Dosing Inpatient   Does not apply q1800   Continuous Infusions: . sodium chloride 10 mL/hr at 04/02/12 0600       Time spent: 25 minutes    Lynsey Ange  [facility] [phone] If 7PM-7AM, please contact night-coverage at [email or web address], password [facility] 04/07/2012, 4:34 PM  LOS: 8 days

## 2012-04-07 NOTE — Progress Notes (Signed)
CARDIAC REHAB PHASE I   PRE:  Rate/Rhythm: 65SR  BP:  Supine:   Sitting: 110/50  Standing:    SaO2: 97%RA  MODE:  Ambulation: 350 ft   POST:  Rate/Rhythm: 77  BP:  Supine:   Sitting: 130/60  Standing:    SaO2: 97%RA 1114-1140 Pt walked to bathroom and then 350 ft on RA with rolling walker with steady gait. Tolerated well. Less chest soreness today. To recliner after walk. Pt would like rollator for home use.    Luetta Nutting, RN BSN  04/07/2012 11:37 AM

## 2012-04-07 NOTE — Discharge Summary (Signed)
Physician Discharge Summary  Patient ID: [name] MRN: [id number] DOB/AGE: 1941/01/02 73 y.o.  Admit date: 03/30/2012 Discharge date: 04/07/2012  Admission Diagnoses: NSTEMI  Discharge Diagnoses:  Principal Problem:   NSTEMI, secondary to demand ischemia, Troponin >7 Active Problems:   CAD, CABG 1991, redo 2006, S/P DES to Left Main 04/01/12   H/O mechanical aortic valve replacement, St Jude 1991   Chronic anticoagulation with Coumadin   Dyslipidemia   HTN (hypertension)   Cardiomyopathy, ischemic, EF 35-40% 2D 03/31/12   Type 2 diabetes mellitus, uncontrolled   Hyperkalemia- 03/30/12- resolved   Acute renal failure after cath 04/01/12   Acute pulmonary edema   Cardiac arrest 04/01/12   Torsades de pointes 04/01/12   Depression- on Remron and Cymbalta prior to admission 03/30/12- combination may have contributed to Torsade, prolonged QTc   Rheumatoid arthritis- chronic steroids   Chest wall pain   Discharged Condition: stable  Hospital Course: This is a 72 y.o. female with a past medical history significant for coronary artery disease, status post coronary bypass grafting and aortic valve replacement 08/12/1989. She had a stent placed 6 years later in 1997. She had redo bypass surgery in 2005. Her other problems include hypertension and diabetes. She presented to [facility] on 3/13 via EMS with a complaint of SOB. Initial EKG en route was concerning for STEMI. However, the patient's EKG was relatively unchanged from prior EKGs, so the code STEMI was cancelled after discussing case with a cardiologist. CXR was c/w CHF. BNP was elevated at 2,717. Labs were c/w acute CHF. Troponin was elevated at 1.11. Her INR was therapeutic.  An echo was obtained which showed new wall motion abnormality and depressed LV function, with an EF of 35-40%. The patient was given lasix and started on a nitro gtt and was admitted to the CCU. Her SOB improved. Her warfarin was held and she was placed on IV heparin. The  initial plan was to wait on diagnostic LHC until her INR decreased to <1.7 to undergo the invasive procedure. However, in the interim, the patient had an abrupt onset of torsades de pointes that occurred in the stetting of severely prolonged QT interval of 503 ms and frequent PVCs with "R-on-T" phenomenon. She required CPR and a single defibrillator shock. She converted to NSR. She had no respiratory of neurological complaints. Her electrolytes were within normal limits. All of her potentially QT prolonging drugs was discharged (Remeron and Cymbalta). She was started on IV lidocaine. It was decided to undergo early angio on Saturday 3/15, rather than until the following Monday, as originally planned. Her INR, was actually at a relatively safe level to undergo an invasive procedure at 1.68. The catheterization was performed by Dr. Eldridge Dace.  It revealed 90% stenosis of the proximal left main coronary artery. She underwent successful PCI and stenting of the left main, using a DES. She left the cath lab in stable condition. She did have acute renal injury after the cath, but had no other post-operative complications. Her right groin was stable. She was placed on dual antiplatelet therapy with ASA and Plavix. She denied any chest pain and had no further arrhythmias. She was transferred to stepdown and the IV lidocaine was discharged. She was continued on IV heparin until her INR was above 2.0, then she transitioned back to warfarin, with an INR goal of 2.5-3.5. She was restarted on Cymbalta, as it was felt that it was not Cymbalta alone that caused her prolonged QT, but the combination/interaction with  Remeron. She had also endorsed moderate-severe right sided, pleuritic chest pain that was thought to be secondary to the CPR. A chest CT was obtained, and showed no rib fractures, and no signs of a PE and no evidence of PNA or a localized bleed. She was however found to have scattered nodular lesions in her lungs that  most likely represented granulomatous disease, however metastatic disease could not be ruled out. Her pain was treated with a fentanyl patch, prn vicodin and dilaudid. On hospital day 8, she was seen and examined by Dr. Royann Shivers. She was maintaining NSR, her INR was therapeutic at 2.84, her  SCr. ultimately improved back towards her baseline at 1.26, and her musculoskeletal pain was better controlled. Dr. Royann Shivers, felt that she was stable for discharge home. She will continue with ASA + warfarin + Plavix for 1 month, followed by warfarin + Plavix for 1 year for her DES. She was instructed to discontinue taking Remeron at home. She verbalized understanding. She was also notified of the CT findings and instructed to follow-up with her pulmonologist at [facility]. She will follow-up with Wilburt Finlay, PA-C at [facility] on 04/12/12.   Consults: [facility] for DM management  Significant Diagnostic Studies:   LHC 04/01/12 ANGIOGRAPHIC DATA: The left main coronary artery has a proximal 90% stenosis. The disease extends into the proximal left circumflex. The remainder of the circumflex has mild to moderate diffuse atherosclerosis. There is a small OM 1 and OM 2. The OM 3 is medium size. There is mild proximal disease.  Treatments: See Hospital Course Discharge Exam: Blood pressure 135/45, pulse 67, temperature 98.1 F (36.7 C), temperature source Oral, resp. rate 18, height 5\' 3"  (1.6 m), weight 187 lb 9.8 oz (85.1 kg), SpO2 96.00%.   Disposition: 01-Home or Self Care  Discharge Orders   Future Appointments Provider Department Dept Phone   06/08/2012 9:00 AM [facility] [phone]   06/08/2012 9:30 AM [facility] [phone]   06/08/2012 10:00 AM Evern Bio, NP [facility] [phone]   Future Orders Complete By Expires     Diet - low sodium heart healthy  As directed     Increase  activity slowly  As directed         Medication List    STOP taking these medications       mirtazapine 15 MG disintegrating tablet  Commonly known as:  REMERON SOL-TAB      TAKE these medications       aspirin 81 MG tablet  Take 81 mg by mouth daily.     calcium carbonate 600 MG Tabs  Commonly known as:  OS-CAL  Take 600 mg by mouth 2 (two) times daily.     cephALEXin 500 MG capsule  Commonly known as:  KEFLEX  Take 500 mg by mouth 2 (two) times daily. 7 day treatment. Filled on 03/29/12.     clopidogrel 75 MG tablet  Commonly known as:  PLAVIX  Take 1 tablet (75 mg total) by mouth daily with breakfast.     diclofenac 1.3 % Ptch  Commonly known as:  FLECTOR  Place 1 patch onto the skin 2 (two) times daily.     DULoxetine 60 MG capsule  Commonly known as:  CYMBALTA  Take 60 mg by mouth daily.     fenofibrate 54 MG tablet  Take 54 mg by mouth daily.     furosemide  40 MG tablet  Commonly known as:  LASIX  Take 1 tablet (40 mg total) by mouth daily.     gabapentin 300 MG capsule  Commonly known as:  NEURONTIN  Take 300 mg by mouth 2 (two) times daily.     gabapentin 300 MG capsule  Commonly known as:  NEURONTIN  Take 300 mg by mouth daily as needed (pain. If needed in addition to twice daily dose.).     HYDROcodone-acetaminophen 7.5-325 MG per tablet  Commonly known as:  NORCO  Take 1 tablet by mouth at bedtime as needed. For pain     HYDROmorphone 2 MG tablet  Commonly known as:  DILAUDID  Take 0.5 tablets (1 mg total) by mouth every 4 (four) hours as needed.     insulin aspart 100 UNIT/ML injection  Commonly known as:  novoLOG  Inject 0-15 Units into the skin 3 (three) times daily with meals.     insulin glargine 100 UNIT/ML injection  Commonly known as:  LANTUS  Inject 0.6 mLs (60 Units total) into the skin daily with breakfast.     losartan 100 MG tablet  Commonly known as:  COZAAR  Take 100 mg by mouth daily.     methotrexate 2.5 MG tablet   Commonly known as:  RHEUMATREX  Take 15 mg by mouth once a week. Thursdays.Caution:Chemotherapy. Protect from light.     metoprolol succinate 25 MG 24 hr tablet  Commonly known as:  TOPROL-XL  Take 1 tablet (25 mg total) by mouth 2 (two) times daily.     multivitamin capsule  Take 1 capsule by mouth daily.     mupirocin ointment 2 %  Commonly known as:  BACTROBAN  Apply topically as needed.     nitroGLYCERIN 0.4 MG SL tablet  Commonly known as:  NITROSTAT  Place 1 tablet (0.4 mg total) under the tongue every 5 (five) minutes x 3 doses as needed for chest pain.     potassium chloride SA 20 MEQ tablet  Commonly known as:  K-DUR,KLOR-CON  Take 20 mEq by mouth every Monday, Wednesday, and Friday.     predniSONE 5 MG tablet  Commonly known as:  DELTASONE  Take 10 mg by mouth daily.     temazepam 30 MG capsule  Commonly known as:  RESTORIL  Take 1 capsule (30 mg total) by mouth at bedtime as needed for sleep.     vitamin B-12 1000 MCG tablet  Commonly known as:  CYANOCOBALAMIN  Take 1,000 mcg by mouth 2 (two) times daily.     warfarin 5 MG tablet  Commonly known as:  COUMADIN  Take 2.5-5 mg by mouth daily. Monday and Friday take 1 tablet (5 mg), Sunday, Tuesday, Wednesday, Thursday, Saturday take half-tablet (2.5 mg).           Follow-up Information   Follow up with HAGER, BRYAN, PA-C On 04/12/2012. (11:20 am )    Contact information:   [address] [place] [address] [phone]      TIME SPENT ON DISCHARGE, INCLUDING PHYSICIAN TIME: > 30 MINUTES  Signed: Allayne Butcher, PA-C 04/07/2012, 12:24 PM

## 2012-04-07 NOTE — Progress Notes (Signed)
[facility]  Subjective: Right sided chest pain is improving.   Objective: Vital signs in last 24 hours: Temp:  [97.4 F (36.3 C)-98.1 F (36.7 C)] 98.1 F (36.7 C) (03/21 0606) Pulse Rate:  [67-78] 67 (03/21 0606) Resp:  [16-18] 18 (03/21 0606) BP: (112-135)/(40-46) 135/45 mmHg (03/21 0606) SpO2:  [93 %-96 %] 96 % (03/20 2002) Weight:  [187 lb 9.8 oz (85.1 kg)] 187 lb 9.8 oz (85.1 kg) (03/21 0606) Last BM Date: 04/06/12  Intake/Output from previous day: 03/20 0701 - 03/21 0700 In: 840 [P.O.:840] Out: 5900 [Urine:5900] Intake/Output this shift: Total I/O In: 240 [P.O.:240] Out: -   Medications Current Facility-Administered Medications  Medication Dose Route Frequency Provider Last Rate Last Dose  . 0.9 %  sodium chloride infusion   Intravenous Continuous Elease Etienne, MD 10 mL/hr at 04/02/12 0600    . acetaminophen (TYLENOL) tablet 650 mg  650 mg Oral Q4H PRN Abelino Derrick, PA-C      . ALPRAZolam Prudy Feeler) tablet 0.25 mg  0.25 mg Oral BID PRN Abelino Derrick, PA-C      . aspirin chewable tablet 81 mg  81 mg Oral Daily Runell Gess, MD   81 mg at 04/06/12 1028  . calcium carbonate (OS-CAL - dosed in mg of elemental calcium) tablet 500 mg of elemental calcium  1 tablet Oral BID WC Eda Paschal Kilroy, PA-C   500 mg of elemental calcium at 04/07/12 1026  . clopidogrel (PLAVIX) tablet 75 mg  75 mg Oral Q breakfast Corky Crafts, MD   75 mg at 04/07/12 1027  . dextrose 50 % solution 25 mL  25 mL Intravenous PRN Eda Paschal Kilroy, PA-C      . dextrose 50 % solution 25 mL  25 mL Intravenous PRN Elease Etienne, MD      . diclofenac (FLECTOR) 1.3 % 1 patch  1 patch Transdermal BID Pamella Pert, MD   1 patch at 04/07/12 1026  . DULoxetine (CYMBALTA) DR capsule 60 mg  60 mg Oral Daily Abelino Derrick, PA-C   60 mg at 04/07/12 1027  . fenofibrate tablet 54 mg  54 mg Oral Daily Abelino Derrick, PA-C   54 mg at 04/07/12 1027  . fentaNYL (SUBLIMAZE)  injection 25 mcg  25 mcg Intravenous Q1H PRN Raenah Murley, MD   25 mcg at 04/02/12 2359  . furosemide (LASIX) tablet 40 mg  40 mg Oral Daily Afton Lavalle, MD   40 mg at 04/07/12 1027  . gabapentin (NEURONTIN) capsule 300 mg  300 mg Oral BID Laura Simmons, PA-C   300 mg at 04/07/12 1027  . gabapentin (NEURONTIN) capsule 300 mg  300 mg Oral Daily PRN Laura Simmons, PA-C      . HYDROcodone-acetaminophen (NORCO) 7.5-325 MG per tablet 1 tablet  1 tablet Oral Q4H PRN Robbie Lis, PA-C   1 tablet at 04/07/12 1032  . HYDROmorphone (DILAUDID) tablet 1 mg  1 mg Oral Q4H PRN Abelino Derrick, PA-C   1 mg at 04/07/12 1035  . insulin aspart (novoLOG) injection 0-15 Units  0-15 Units Subcutaneous TID WC Russella Dar, NP   2 Units at 04/06/12 1658  . insulin aspart (novoLOG) injection 0-5 Units  0-5 Units Subcutaneous QHS Russella Dar, NP   2 Units at 04/04/12 2146  . insulin aspart (novoLOG) injection 3 Units  3 Units Subcutaneous TID WC Pamella Pert, MD   3 Units at 04/06/12 1658  .  insulin glargine (LANTUS) injection 60 Units  60 Units Subcutaneous Q breakfast Pamella Pert, MD   60 Units at 04/07/12 1025  . metoprolol succinate (TOPROL-XL) 24 hr tablet 25 mg  25 mg Oral BID Chrystie Nose, MD   25 mg at 04/07/12 1027  . multivitamin with minerals tablet 1 tablet  1 tablet Oral Daily Abelino Derrick, PA-C   1 tablet at 04/07/12 1027  . mupirocin ointment (BACTROBAN) 2 %   Topical BID Eda Paschal Kilroy, PA-C      . nitroGLYCERIN (NITROSTAT) SL tablet 0.4 mg  0.4 mg Sublingual Q5 Min x 3 PRN Eda Paschal Kilroy, PA-C      . ondansetron [facility]) injection 4 mg  4 mg Intravenous Q6H PRN Abelino Derrick, PA-C   4 mg at 04/01/12 1412  . pantoprazole (PROTONIX) EC tablet 40 mg  40 mg Oral Daily Abelino Derrick, PA-C   40 mg at 04/06/12 1018  . predniSONE (DELTASONE) tablet 10 mg  10 mg Oral Daily Corky Crafts, MD   10 mg at 04/07/12 1027  . temazepam (RESTORIL) capsule 30 mg  30 mg Oral QHS PRN Abelino Derrick, PA-C   30 mg at 04/07/12 0016  . vitamin B-12 (CYANOCOBALAMIN) tablet 1,000 mcg  1,000 mcg Oral BID Abelino Derrick, PA-C   1,000 mcg at 04/07/12 1026  . warfarin (COUMADIN) tablet 2.5 mg  2.5 mg Oral ONCE-1800 Runell Gess, MD      . Warfarin - Pharmacist Dosing Inpatient   Does not apply q1800 Lauren Bajbus, RPH        PE: General appearance: alert, cooperative and no distress Lungs: clear to auscultation bilaterally Heart: regular rate and rhythm and crisp aortic valve sounds Extremities: no LEE Pulses: 2+ and symmetric Skin: warm and dry Neurologic: Grossly normal  Lab Results:   Recent Labs  04/05/12 0525  WBC 7.8  HGB 9.7*  HCT 29.2*  PLT 298   BMET  Recent Labs  04/05/12 0525  NA 138  K 4.4  CL 102  CO2 27  GLUCOSE 104*  BUN 50*  CREATININE 1.26*  CALCIUM 8.5   PT/INR  Recent Labs  04/05/12 0525 04/06/12 0455 04/07/12 0523  LABPROT 22.2* 24.3* 28.4*  INR 2.04* 2.30* 2.84*    Studies/Results: CT of Chest W/O Contrast 3/20 IMPRESSION:   1. Small bilateral pleural effusions and probable mild diffuse  pulmonary edema.  2. Numerous diffuse tiny less than 1 cm pulmonary nodules.  Differential diagnosis includes metastatic disease as well as other  infectious or inflammatory etiologies.  3. No evidence of lymphadenopathy.  4. 4.1 cm ascending thoracic aortic aneurysm. No evidence of  aneurysm leak or rupture.  5. No definite rib fractures or other suspicious bone lesions  identified.   Assessment/Plan  Principal Problem:   NSTEMI, secondary to demand ischemia, Troponin >7 Active Problems:   CAD, CABG 1991, redo 2006, S/P DES to Left Main 04/01/12   H/O mechanical aortic valve replacement, St Jude 1991   Chronic anticoagulation with Coumadin   Dyslipidemia   HTN (hypertension)   Cardiomyopathy, ischemic, EF 35-40% 2D 03/31/12   Type 2 diabetes mellitus, uncontrolled   Hyperkalemia- 03/30/12- resolved   Acute renal failure after  cath 04/01/12   Acute pulmonary edema   Cardiac arrest 04/01/12   Torsades de pointes 04/01/12   Depression- on Remron and Cymbalta prior to admission 03/30/12- combination may have contributed to Torsade, prolonged QTc   Rheumatoid  arthritis- chronic steroids   Chest wall pain  Plan: INR is now therapeutic at 2.84 for mechanical aortic valve. Continue with ASA + Plavix for DES to left main. CT yesterday showed small bilateral pleural effusions and probable diffuse pulmonary edema. There were also numerous diffuse tiny, less than 1 cm pulmonary nodules. D/D includes metastatic disease as well as other infectious or inflammatory etiologies. Would recommend follow-up as an OP. There were no definite rib fractures or other suspicious bone lesions identified. There was no mention of possible PE. Her pain is improving with PRN pain meds. Consider discharging home today with early OP follow-up. MD to follow with recommendation.    LOS: 8 days    [name] 04/07/2012 10:57 AM  I have seen and examined the patient along with Boyce Medici, PA.  I have reviewed the chart, notes and new data.  I agree with PA/NP's note.  Key new complaints: Her musculoskeletal posterior right rib cage pain is improving. The CT of the chest did not show any obvious rib fractures. She does have scattered nodular lesions in her lungs that most likely represent granulomatous disease but one cannot exclude possible metastatic illness. She will require further followup with a pulmonary physician after her discharge.  She has has not had any further tinea or symptoms of presyncope. She does not have anginal chest pain Key examination changes: Lungs are clear to auscultation bilaterally. She is afebrile. She has no overt signs of hypervolemia by physical exam Key new findings / data: White blood cell count is normal. Creatinine is down to 1.26 which is almost her baseline and that she better than on  admission.  PLAN: [name] is a complex patient with recent myocardial infarction complicated by new onset systolic and diastolic heart failure and cardiac arrest he to malignant ventricular arrhythmia (torsades de pointes in the setting of long QT syndrome due ischemia and possible contribution of psychotropic medications).  Cymbalta as necessary for severe neuropathy but Remeron will be discontinued permanently  She had acute renal failure that fortunately is resolving  In addition she has insulin requiring type 2 diabetes mellitus that has been poorly controlled and adjustments in her insulin regimen have been made. A sliding scale and thrice daily dosing of NovoLog insulin was added to her Lantus prescription  She'll require very early followup for her severe cardiovascular problems and multiple adjustments in her medications. We will contact her by phone within the next 2 business days and we'll see her back in the office within the next week.   Thurmon Fair, MD, [facility] [phone] 04/07/2012, 12:08 PM

## 2012-05-31 ENCOUNTER — Ambulatory Visit: Payer: Medicare Other | Admitting: Neurosurgery

## 2012-06-01 ENCOUNTER — Ambulatory Visit (INDEPENDENT_AMBULATORY_CARE_PROVIDER_SITE_OTHER): Payer: Medicare Other | Admitting: Pharmacist Clinician (PhC)/ Clinical Pharmacy Specialist

## 2012-06-01 VITALS — BP 146/70 | HR 80

## 2012-06-01 DIAGNOSIS — Z952 Presence of prosthetic heart valve: Secondary | ICD-10-CM

## 2012-06-01 DIAGNOSIS — Z7901 Long term (current) use of anticoagulants: Secondary | ICD-10-CM

## 2012-06-01 DIAGNOSIS — Z954 Presence of other heart-valve replacement: Secondary | ICD-10-CM

## 2012-06-08 ENCOUNTER — Ambulatory Visit: Payer: Self-pay | Admitting: Neurosurgery

## 2012-06-14 ENCOUNTER — Ambulatory Visit (INDEPENDENT_AMBULATORY_CARE_PROVIDER_SITE_OTHER): Payer: Medicare Other | Admitting: Physician Assistant

## 2012-06-14 ENCOUNTER — Telehealth: Payer: Self-pay | Admitting: Cardiovascular Disease

## 2012-06-14 ENCOUNTER — Ambulatory Visit (INDEPENDENT_AMBULATORY_CARE_PROVIDER_SITE_OTHER): Payer: Medicare Other | Admitting: Pharmacist Clinician (PhC)/ Clinical Pharmacy Specialist

## 2012-06-14 ENCOUNTER — Encounter: Payer: Self-pay | Admitting: *Deleted

## 2012-06-14 ENCOUNTER — Encounter: Payer: Self-pay | Admitting: Physician Assistant

## 2012-06-14 ENCOUNTER — Encounter: Payer: Self-pay | Admitting: Cardiovascular Disease

## 2012-06-14 VITALS — BP 140/68 | HR 83 | Ht 62.0 in | Wt 182.2 lb

## 2012-06-14 DIAGNOSIS — I10 Essential (primary) hypertension: Secondary | ICD-10-CM

## 2012-06-14 DIAGNOSIS — I2510 Atherosclerotic heart disease of native coronary artery without angina pectoris: Secondary | ICD-10-CM

## 2012-06-14 DIAGNOSIS — I2589 Other forms of chronic ischemic heart disease: Secondary | ICD-10-CM

## 2012-06-14 DIAGNOSIS — I509 Heart failure, unspecified: Secondary | ICD-10-CM

## 2012-06-14 DIAGNOSIS — R0602 Shortness of breath: Secondary | ICD-10-CM

## 2012-06-14 DIAGNOSIS — Z79899 Other long term (current) drug therapy: Secondary | ICD-10-CM

## 2012-06-14 DIAGNOSIS — I255 Ischemic cardiomyopathy: Secondary | ICD-10-CM

## 2012-06-14 DIAGNOSIS — Z952 Presence of prosthetic heart valve: Secondary | ICD-10-CM

## 2012-06-14 DIAGNOSIS — Z954 Presence of other heart-valve replacement: Secondary | ICD-10-CM

## 2012-06-14 DIAGNOSIS — Z7901 Long term (current) use of anticoagulants: Secondary | ICD-10-CM

## 2012-06-14 DIAGNOSIS — I5023 Acute on chronic systolic (congestive) heart failure: Secondary | ICD-10-CM

## 2012-06-14 DIAGNOSIS — R238 Other skin changes: Secondary | ICD-10-CM | POA: Insufficient documentation

## 2012-06-14 DIAGNOSIS — I5043 Acute on chronic combined systolic (congestive) and diastolic (congestive) heart failure: Secondary | ICD-10-CM | POA: Insufficient documentation

## 2012-06-14 MED ORDER — FUROSEMIDE 40 MG PO TABS: 40.0000 mg | ORAL_TABLET | Freq: Two times a day (BID) | ORAL | Status: AC

## 2012-06-14 NOTE — Telephone Encounter (Signed)
Pt feet are swollen,have a few weepy sores on both legs,and sob some-feet been swollen for about 4 days! Need your advice!

## 2012-06-14 NOTE — Assessment & Plan Note (Addendum)
Patient has several lower extremity blisters some which are filled with blood and some with clear fluid. I think this may be related to a higher than normal INR combined with use of Plavix and volume overload. We'll check a platelet count with a CBC. The lesions could also be contacted dermatitis.  Recommended patient use Benadryl to help control itching.

## 2012-06-14 NOTE — Assessment & Plan Note (Addendum)
Increase Lasix to 40 mg twice daily. Decrease sodium in diet. Continue with daily weights. Check BNP, BMET.

## 2012-06-14 NOTE — Telephone Encounter (Signed)
Returned call.  Pt stated when she saw Dr. Salena Saner last he told her if she had swelling or SOB to call.  Pt c/o blood blisters on the bottom of her legs x 2 days.  Also c/o feet swelling x 4-5 days and SOB.  Stated she almost feels like she has fluid building up in her body.  Also stated weight is up 5lbs since Monday.  Pt stated she think she needs to be seen, but doesn't think she needs to go to the ER.  Appt scheduled for today at 4pm w/ B. Hager, PA-C.  ER instructions given in the event of worsening symptoms.  Pt verbalized understanding and agreed w/ plan.

## 2012-06-14 NOTE — Patient Instructions (Signed)
Your physician recommends that you schedule a follow-up appointment in:2-3 WEEKS.  Your physician recommends that you return for lab work.

## 2012-06-14 NOTE — Progress Notes (Signed)
Date:  06/14/2012   ID:  [name], DOB 1940/04/07, MRN [id number]  PCP:  Londell Moh, MD  Primary Cardiologist:  Croitoru     History of Present Illness: [name] is a 73 y.o. female history coronary artery disease and is status post coronary bypass grafting along with replacement of aortic valve mechanical prosthesis 1991. Patient recently underwent cardiac catheterization 04/01/2012 was found to have a patent left internal mammary artery to LAD artery and patent saphenous vein graft to the coronary right coronary with high-grade stenosis in the proximal left main coronary which is feeding the left circumflex coronary artery is a non-bypass vessel. She underwent percutaneous revascularize vascularization of extubation drug-eluting stent by Dr. Eldridge Dace. Her history also includes hypertension, diabetes mellitus, hyperlipidemia, rheumatoid arthritis, moderate mitral insufficiency, COPD, peripheral vascular disease, cardiomyopathy with most recent 2-D echocardiogram 03/31/2012 revealing ejection fraction of 35-40% lungs grade 2 diastolic dysfunction. There is moderate hypokinesis of the basal mid inferior and inferior septal myocardium.  Patient presented today with complaints of lower extremity edema along with multiple small blisters below the knees, some filled with clear fluid,  some filled with blood. She does report pruritus in the lower extremities has scratched some of the lesions. She did spend some of months at [place] near [place]. But that did not get exposed to any poison ivy.  Chest reports orthopnea and has to sleep essentially sitting up. She said approximately 5 pound weight gain since Monday. She denies nausea, vomiting, fever, chest pain, hematuria, hematochezia, melena, abdominal pain  Wt Readings from Last 3 Encounters:  06/14/12 182 lb 3.2 oz (82.645 kg)  04/07/12 187 lb 9.8 oz (85.1 kg)  04/07/12 187 lb 9.8 oz (85.1 kg)     Past Medical History   Diagnosis Date  . High blood pressure   . Angina at rest   . Irregular heart rhythm   . Diabetes mellitus   . High cholesterol   . Coronary artery disease   . COPD (chronic obstructive pulmonary disease)   . Anemia   . Irregular heart beat   . Myocardial infarction march 2013  . Peripheral vascular disease   . Cardiac arrest   . CHF (congestive heart failure) 01/06/12    Transthoracic Echocardiogram EF 45-50%    Current Outpatient Prescriptions  Medication Sig Dispense Refill  . calcium carbonate (OS-CAL) 600 MG TABS Take 600 mg by mouth 2 (two) times daily.        . clopidogrel (PLAVIX) 75 MG tablet Take 1 tablet (75 mg total) by mouth daily with breakfast.  30 tablet  11  . DULoxetine (CYMBALTA) 60 MG capsule Take 60 mg by mouth daily.        . fenofibrate (TRICOR) 145 MG tablet Take 145 mg by mouth daily.      Marland Kitchen gabapentin (NEURONTIN) 300 MG capsule Take 300 mg by mouth 2 (two) times daily.      Marland Kitchen gabapentin (NEURONTIN) 300 MG capsule Take 300 mg by mouth daily as needed (pain. If needed in addition to twice daily dose.).      Marland Kitchen HYDROcodone-acetaminophen (NORCO) 7.5-325 MG per tablet Take 1 tablet by mouth at bedtime as needed. For pain      . insulin aspart (NOVOLOG) 100 UNIT/ML injection Inject 0-15 Units into the skin 3 (three) times daily with meals.  1 vial  5  . insulin glargine (LANTUS) 100 UNIT/ML injection Inject 0.6 mLs (60 Units total) into the skin daily with breakfast.  10  mL  5  . losartan (COZAAR) 100 MG tablet Take 100 mg by mouth daily.      . methotrexate (RHEUMATREX) 2.5 MG tablet Take 15 mg by mouth once a week. Thursdays.Caution:Chemotherapy. Protect from light.      . metoprolol succinate (TOPROL-XL) 25 MG 24 hr tablet Take 1 tablet (25 mg total) by mouth 2 (two) times daily.  60 tablet  5  . Multiple Vitamin (MULTIVITAMIN) capsule Take 1 capsule by mouth daily.      . nitroGLYCERIN (NITROSTAT) 0.4 MG SL tablet Place 1 tablet (0.4 mg total) under the tongue  every 5 (five) minutes x 3 doses as needed for chest pain.  25 tablet  5  . potassium chloride SA (K-DUR,KLOR-CON) 20 MEQ tablet Take 20 mEq by mouth every Monday, Wednesday, and Friday.      . temazepam (RESTORIL) 30 MG capsule Take 1 capsule (30 mg total) by mouth at bedtime as needed for sleep.  30 capsule  5  . vitamin B-12 (CYANOCOBALAMIN) 1000 MCG tablet Take 1,000 mcg by mouth 2 (two) times daily.        Marland Kitchen warfarin (COUMADIN) 5 MG tablet Take 2.5-5 mg by mouth daily. Monday and Friday take 1 tablet (5 mg), Sunday, Tuesday, Wednesday, Thursday, Saturday take half-tablet (2.5 mg).      . furosemide (LASIX) 40 MG tablet Take 1 tablet (40 mg total) by mouth 2 (two) times daily.  60 tablet  3  . [DISCONTINUED] famotidine (PEPCID) 20 MG tablet Take 20 mg by mouth at bedtime.      . [DISCONTINUED] omeprazole (PRILOSEC OTC) 20 MG tablet Take 20 mg by mouth daily before breakfast.       No current facility-administered medications for this visit.    Allergies:    Allergies  Allergen Reactions  . Shellfish Allergy Anaphylaxis  . Codeine     Feels sick    Social History:  The patient  reports that she quit smoking about 39 years ago. Her smoking use included Cigarettes. She has a 7.5 pack-year smoking history. She has never used smokeless tobacco. She reports that she does not drink alcohol or use illicit drugs.   ROS:  Please see the history of present illness.  All other systems reviewed and negative.   PHYSICAL EXAM: VS:  BP 140/68  Pulse 83  Ht 5\' 2"  (1.575 m)  Wt 182 lb 3.2 oz (82.645 kg)  BMI 33.32 kg/m2 Well nourished, well developed, in no acute distress HEENT: Pupils are equal round react to light accommodation extraocular movements are intact.  Neck: JVD up to her ear.No cervical lymphadenopathy. Cardiac: Regular rate and rhythm with 1/6 systolic murmur. Lungs:  clear to auscultation bilaterally, no wheezing, rhonchi or rales Ext: 2+ lower extremity edema.  2+ radial and  dorsalis pedis pulses. Skin: warm and dry, multiple discrete lesions/bulla in the lower extremities ranging from 0.5-1.5 cm some filled with blood others with clear fluid. Some erythema at the peripheral areas some with excoriations from scratching. Neuro:  Grossly normal  EKG:  Normal sinus rhythm with deep diffuse T wave inversions and LVH. Essentially unchanged from prior EKGs mildly prolonged QTC 467 ms     ASSESSMENT AND PLAN:  Problem List Items Addressed This Visit   CAD, CABG 1991, redo 2006, S/P DES to Left Main 04/01/12 - Primary (Chronic)   Relevant Medications      furosemide (LASIX) tablet   Other Relevant Orders      EKG 12-Lead  CBC      Basic Metabolic Panel (BMET)   H/O mechanical aortic valve replacement, [employer] 1991 (Chronic)   Relevant Medications      furosemide (LASIX) tablet   Chronic anticoagulation with Coumadin (Chronic)   HTN (hypertension) (Chronic)   Relevant Medications      furosemide (LASIX) tablet   Cardiomyopathy, ischemic, EF 35-40% 2D 03/31/12 (Chronic)   Relevant Medications      furosemide (LASIX) tablet   Multiple blisters lower extremities filled with blood     Patient has several lower extremity blisters some which are filled with blood and some with clear fluid. I think this may be related to a higher than normal INR combined with use of Plavix and volume overload. We'll check a platelet count with a CBC.    Acute on chronic systolic heart failure     Increase Lasix to 40 mg twice daily. Decrease sodium in diet. Continue with daily weights. Check BNP, BMET.    Relevant Medications      furosemide (LASIX) tablet    Other Visit Diagnoses   Acute on chronic congestive heart failure        Relevant Medications       furosemide (LASIX) tablet    Other Relevant Orders       CBC       Basic Metabolic Panel (BMET)    SOB (shortness of breath)        Relevant Orders       B Nat Peptide    Encounter for long-term (current) use of other  medications        Relevant Orders       CBC       Basic Metabolic Panel (BMET)

## 2012-06-15 ENCOUNTER — Telehealth: Payer: Self-pay | Admitting: Cardiovascular Disease

## 2012-06-15 LAB — CBC
Hemoglobin: 11.6 g/dL — ABNORMAL LOW (ref 12.0–15.0)
MCH: 30.1 pg (ref 26.0–34.0)
MCHC: 32.8 g/dL (ref 30.0–36.0)
MCV: 91.7 fL (ref 78.0–100.0)
RBC: 3.86 MIL/uL — ABNORMAL LOW (ref 3.87–5.11)

## 2012-06-15 LAB — BASIC METABOLIC PANEL
BUN: 29 mg/dL — ABNORMAL HIGH (ref 6–23)
CO2: 30 mEq/L (ref 19–32)
Calcium: 10.3 mg/dL (ref 8.4–10.5)
Creat: 1.22 mg/dL — ABNORMAL HIGH (ref 0.50–1.10)
Glucose, Bld: 95 mg/dL (ref 70–99)

## 2012-06-15 NOTE — Telephone Encounter (Signed)
Dr whitfield wants to start her on Flector patch. Is it OK for her to take it?

## 2012-06-16 NOTE — Telephone Encounter (Signed)
Message forwarded to Dr. Croitoru.  

## 2012-06-17 LAB — POCT INR: INR: 3.5

## 2012-06-17 NOTE — Telephone Encounter (Signed)
Flector patch should not interfere with her other meds or heart condition. OK to use Thurmon Fair, MD, [facility] [phone] office [phone] pager

## 2012-06-22 ENCOUNTER — Ambulatory Visit: Payer: Medicare Other | Admitting: Pharmacist Clinician (PhC)/ Clinical Pharmacy Specialist

## 2012-06-27 NOTE — Telephone Encounter (Signed)
Returned call and informed pt per instructions by MD/PA.  Pt verbalized understanding and agreed w/ plan.  

## 2012-07-10 ENCOUNTER — Ambulatory Visit: Payer: Medicare Other | Admitting: Pharmacist Clinician (PhC)/ Clinical Pharmacy Specialist

## 2012-07-10 ENCOUNTER — Ambulatory Visit: Payer: Medicare Other | Admitting: Physician Assistant

## 2012-07-18 ENCOUNTER — Ambulatory Visit (INDEPENDENT_AMBULATORY_CARE_PROVIDER_SITE_OTHER): Payer: Medicare Other | Admitting: Pharmacist Clinician (PhC)/ Clinical Pharmacy Specialist

## 2012-07-18 VITALS — BP 102/50 | HR 56

## 2012-07-18 DIAGNOSIS — Z7901 Long term (current) use of anticoagulants: Secondary | ICD-10-CM

## 2012-07-18 DIAGNOSIS — Z954 Presence of other heart-valve replacement: Secondary | ICD-10-CM

## 2012-07-18 DIAGNOSIS — Z952 Presence of prosthetic heart valve: Secondary | ICD-10-CM

## 2012-07-24 ENCOUNTER — Other Ambulatory Visit: Payer: Self-pay | Admitting: Pharmacist Clinician (PhC)/ Clinical Pharmacy Specialist

## 2012-07-24 MED ORDER — CLOPIDOGREL BISULFATE 75 MG PO TABS: 75.0000 mg | ORAL_TABLET | Freq: Every day | ORAL | Status: AC

## 2012-07-24 MED ORDER — METOPROLOL SUCCINATE ER 25 MG PO TB24: 25.0000 mg | ORAL_TABLET | Freq: Two times a day (BID) | ORAL | Status: AC

## 2012-08-01 ENCOUNTER — Ambulatory Visit: Payer: Medicare Other | Admitting: Pharmacist Clinician (PhC)/ Clinical Pharmacy Specialist

## 2012-08-01 ENCOUNTER — Telehealth: Payer: Self-pay | Admitting: Cardiovascular Disease

## 2012-08-01 NOTE — Telephone Encounter (Signed)
Can you make one for her please?

## 2012-08-01 NOTE — Telephone Encounter (Signed)
Needs a letter stating that it is all right for her to go back to work!

## 2012-08-01 NOTE — Telephone Encounter (Signed)
Message forwarded to Dr. Croitoru.  

## 2012-08-02 ENCOUNTER — Ambulatory Visit (INDEPENDENT_AMBULATORY_CARE_PROVIDER_SITE_OTHER): Payer: Medicare Other | Admitting: Pharmacist Clinician (PhC)/ Clinical Pharmacy Specialist

## 2012-08-02 VITALS — BP 136/60 | HR 88

## 2012-08-02 DIAGNOSIS — Z952 Presence of prosthetic heart valve: Secondary | ICD-10-CM

## 2012-08-02 DIAGNOSIS — Z7901 Long term (current) use of anticoagulants: Secondary | ICD-10-CM

## 2012-08-02 DIAGNOSIS — Z954 Presence of other heart-valve replacement: Secondary | ICD-10-CM

## 2012-08-15 ENCOUNTER — Ambulatory Visit (INDEPENDENT_AMBULATORY_CARE_PROVIDER_SITE_OTHER): Payer: Medicare Other | Admitting: Pharmacist Clinician (PhC)/ Clinical Pharmacy Specialist

## 2012-08-15 DIAGNOSIS — Z954 Presence of other heart-valve replacement: Secondary | ICD-10-CM

## 2012-08-15 DIAGNOSIS — Z7901 Long term (current) use of anticoagulants: Secondary | ICD-10-CM

## 2012-08-15 DIAGNOSIS — Z952 Presence of prosthetic heart valve: Secondary | ICD-10-CM

## 2012-08-28 ENCOUNTER — Ambulatory Visit: Payer: Medicare Other | Admitting: Pharmacist Clinician (PhC)/ Clinical Pharmacy Specialist

## 2012-08-30 ENCOUNTER — Ambulatory Visit: Payer: Medicare Other | Admitting: Pharmacist Clinician (PhC)/ Clinical Pharmacy Specialist

## 2012-09-01 ENCOUNTER — Ambulatory Visit (INDEPENDENT_AMBULATORY_CARE_PROVIDER_SITE_OTHER): Payer: Medicare Other | Admitting: Pharmacist Clinician (PhC)/ Clinical Pharmacy Specialist

## 2012-09-01 DIAGNOSIS — Z952 Presence of prosthetic heart valve: Secondary | ICD-10-CM

## 2012-09-01 DIAGNOSIS — Z7901 Long term (current) use of anticoagulants: Secondary | ICD-10-CM

## 2012-09-01 DIAGNOSIS — Z954 Presence of other heart-valve replacement: Secondary | ICD-10-CM

## 2012-09-05 ENCOUNTER — Encounter: Payer: Self-pay | Admitting: Neurology

## 2012-09-06 ENCOUNTER — Ambulatory Visit (INDEPENDENT_AMBULATORY_CARE_PROVIDER_SITE_OTHER): Payer: Medicare Other | Admitting: Neurology

## 2012-09-06 ENCOUNTER — Encounter: Payer: Self-pay | Admitting: Neurology

## 2012-09-06 VITALS — BP 126/72 | HR 64 | Ht 62.0 in | Wt 178.0 lb

## 2012-09-06 DIAGNOSIS — R6889 Other general symptoms and signs: Secondary | ICD-10-CM

## 2012-09-06 DIAGNOSIS — D518 Other vitamin B12 deficiency anemias: Secondary | ICD-10-CM

## 2012-09-06 DIAGNOSIS — R413 Other amnesia: Secondary | ICD-10-CM

## 2012-09-06 HISTORY — DX: Other amnesia: R41.3

## 2012-09-06 NOTE — Progress Notes (Signed)
Reason for visit: Memory disturbance  [name] is a 72 y.o. female  History of present illness:  [name] is a 72 year old right-handed white female with a history of diabetes, cerebrovascular disease, and coronary artery disease. The patient has had an aortic valve replacement with a mechanical heart valve, and she is on Coumadin therapy. The patient has been noted to have some mild memory issues over the last one year. The patient is having increasing problems managing her finances. The patient was in the hospital in March of 2014. The patient suffered a very brief cardiac arrest, and she did not require intubation. The patient has had some personality change with increasing irritability. The patient was on mirtazapine previously for sleep, but this was stopped, and the patient was placed on Restoril for sleep. The patient has been able to manage her medications, keep up with appointments, and operate a motor vehicle without problems. The patient reports no new numbness or weakness of the face, arms, or legs. The patient does have some mild gait instability, and she uses a cane for ambulation. The patient denies any blackout spells or seizures. The patient was sent to this office for further evaluation.  Past Medical History  Diagnosis Date  . High blood pressure   . Angina at rest   . Irregular heart rhythm   . Diabetes mellitus   . High cholesterol   . Coronary artery disease   . COPD (chronic obstructive pulmonary disease)   . Anemia   . Irregular heart beat   . Myocardial infarction march 2013  . Peripheral vascular disease   . Cardiac arrest   . CHF (congestive heart failure) 01/06/12    Transthoracic Echocardiogram EF 45-50%  . Memory deficits 09/06/2012    Past Surgical History  Procedure Laterality Date  . Gallbadder  2003  . Cardiac valve replacement  1991  . Heart bypass  1991/2005  . Angioplasty    . Coronary artery bypass graft    . Aortic valve  replacement    . Appendectomy    . Abdominal hysterectomy    . Cholecystectomy    . Esophagogastroduodenoscopy  03/31/2011    Procedure: ESOPHAGOGASTRODUODENOSCOPY (EGD);  Surgeon: Beverley Fiedler, MD;  Location[facility];  Service: Gastroenterology;  Laterality: N/A;  . Bypass graft  1991 and 2005    Family History  Problem Relation Age of Onset  . Rheum arthritis Mother   . Cervical cancer Mother   . Stomach cancer Mother   . Cancer Mother   . Hyperlipidemia Mother   . Hypertension Mother   . Rheum arthritis Father   . Rheum arthritis Paternal Aunt   . Anesthesia problems Neg Hx   . Deep vein thrombosis Brother   . Hypertension Brother   . Hypertension Daughter     Social history:  reports that she quit smoking about 39 years ago. Her smoking use included Cigarettes. She has a 7.5 pack-year smoking history. She has never used smokeless tobacco. She reports that she does not drink alcohol or use illicit drugs.  Medications:  Current Outpatient Prescriptions on File Prior to Visit  Medication Sig Dispense Refill  . calcium carbonate (OS-CAL) 600 MG TABS Take 600 mg by mouth 2 (two) times daily.        . cephALEXin (KEFLEX) 250 MG capsule Take 250 mg by mouth daily.      . clopidogrel (PLAVIX) 75 MG tablet Take 1 tablet (75 mg total) by mouth daily with  breakfast.  90 tablet  3  . DULoxetine (CYMBALTA) 60 MG capsule Take 60 mg by mouth daily.        . fenofibrate (TRICOR) 145 MG tablet Take 145 mg by mouth daily.      . furosemide (LASIX) 40 MG tablet Take 1 tablet (40 mg total) by mouth 2 (two) times daily.  60 tablet  3  . gabapentin (NEURONTIN) 300 MG capsule Take 300 mg by mouth 3 (three) times daily.       Marland Kitchen HYDROcodone-acetaminophen (NORCO) 7.5-325 MG per tablet Take 1 tablet by mouth at bedtime as needed. For pain      . insulin aspart (NOVOLOG) 100 UNIT/ML injection Inject 0-15 Units into the skin 3 (three) times daily with meals.  1 vial  5  . insulin glargine (LANTUS)  100 UNIT/ML injection Inject 0.6 mLs (60 Units total) into the skin daily with breakfast.  10 mL  5  . INVESTIGATIONAL DRUG SIMPLE RECORD Inject 1 mg into the skin every 14 (fourteen) days. PCKS-9 trial thru [facility].  Do not share cholesterol lab information with patient or [facility]      . isosorbide mononitrate (IMDUR) 30 MG 24 hr tablet Take 30 mg by mouth daily.      Marland Kitchen losartan (COZAAR) 100 MG tablet Take 100 mg by mouth daily.      . methotrexate (RHEUMATREX) 2.5 MG tablet Take 15 mg by mouth once a week. Thursdays.Caution:Chemotherapy. Protect from light.      . metoprolol succinate (TOPROL-XL) 25 MG 24 hr tablet Take 1 tablet (25 mg total) by mouth 2 (two) times daily.  180 tablet  3  . Multiple Vitamin (MULTIVITAMIN) capsule Take 1 capsule by mouth daily.      . nitroGLYCERIN (NITROSTAT) 0.4 MG SL tablet Place 1 tablet (0.4 mg total) under the tongue every 5 (five) minutes x 3 doses as needed for chest pain.  25 tablet  5  . potassium chloride SA (K-DUR,KLOR-CON) 20 MEQ tablet Take 20 mEq by mouth every Monday, Wednesday, and Friday.      . temazepam (RESTORIL) 30 MG capsule Take 1 capsule (30 mg total) by mouth at bedtime as needed for sleep.  30 capsule  5  . vitamin B-12 (CYANOCOBALAMIN) 1000 MCG tablet Take 1,000 mcg by mouth 2 (two) times daily.        . VOLTAREN 1 % GEL Apply 1 application topically daily as needed.      . warfarin (COUMADIN) 5 MG tablet Take 2.5-5 mg by mouth daily. Monday and Friday take 1 tablet (5 mg), Sunday, Tuesday, Wednesday, Thursday, Saturday take half-tablet (2.5 mg).      . [DISCONTINUED] famotidine (PEPCID) 20 MG tablet Take 20 mg by mouth at bedtime.      . [DISCONTINUED] omeprazole (PRILOSEC OTC) 20 MG tablet Take 20 mg by mouth daily before breakfast.       No current facility-administered medications on file prior to visit.      Allergies  Allergen Reactions  . Shellfish Allergy Anaphylaxis  . Codeine     Feels sick     ROS:  Out of a complete 14 system review of symptoms, the patient complains only of the following symptoms, and all other reviewed systems are negative.  Weight gain, weight loss Chest pains, heart murmur, swelling in the legs Moles Shortness of breath Easy bruising, easy bleeding Joint pain, joint swelling, achy muscles Runny nose Numbness, weakness Insomnia, sleepiness  Blood  pressure 126/72, pulse 64, height 5\' 2"  (1.575 m), weight 178 lb (80.74 kg).  Physical Exam  General: The patient is alert and cooperative at the time of the examination. The patient has been minimally to moderately obese.  Head: Pupils are equal, round, and reactive to light. Discs are flat bilaterally.  Neck: The neck is supple, no carotid bruits are noted.  Respiratory: The respiratory examination is clear.  Cardiovascular: The cardiovascular examination reveals a regular rate and rhythm, a grade 2/6 systolic heart murmur is noted, a valvular click is noted.  Skin: Extremities are with 1-2+ edema below the knees.  Neurologic Exam  Mental status: Mini-Mental status examination done today shows a total score 28/30.  Cranial nerves: Facial symmetry is present. There is good sensation of the face to pinprick and soft touch bilaterally. The strength of the facial muscles and the muscles to head turning and shoulder shrug are normal bilaterally. Speech is well enunciated, no aphasia or dysarthria is noted. Extraocular movements are full. Visual fields are full.  Motor: The motor testing reveals 5 over 5 strength of all 4 extremities. Good symmetric motor tone is noted throughout.  Sensory: Sensory testing is intact to pinprick, soft touch, vibration sensation, and position sense on all 4 extremities, with the exception of a stocking pattern pinprick sensory deficit across the ankles bilaterally. Vibration sensation is depressed slightly in the feet bilaterally. No evidence of extinction is  noted.  Coordination: Cerebellar testing reveals good finger-nose-finger and heel-to-shin bilaterally.  Gait and station: Gait is slightly wide-based, the patient uses a cane for ambulation. Tandem gait is unsteady. Romberg is negative. No drift is seen.  Reflexes: Deep tendon reflexes are symmetric, but are depressed bilaterally. Toes are downgoing bilaterally.   Assessment/Plan:  1. Mild memory disturbance  2. 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 The patient was likely developing a mild memory disturbance one year ago. The patient may have some underlying depression resulting in the personality change. The patient is on Cymbalta, and this can be increased in the future. The patient will undergo CT scan evaluation of the brain, and some blood work will be done today. The patient will followup in 5 or 6 months. If the above studies are unremarkable, the Cymbalta dosing can be increased.  Marlan Palau MD 09/06/2012 8:01 PM  [facility] [address] [place], [place] [address]  Phone [phone] Fax [phone]

## 2012-09-08 ENCOUNTER — Ambulatory Visit
Admission: RE | Admit: 2012-09-08 | Discharge: 2012-09-08 | Disposition: A | Discharge status: Home / Self Care | Payer: Medicare Other | Source: Ambulatory Visit | Attending: Neurology | Admitting: Neurology

## 2012-09-08 DIAGNOSIS — R413 Other amnesia: Secondary | ICD-10-CM

## 2012-09-13 ENCOUNTER — Other Ambulatory Visit: Payer: Self-pay | Admitting: Neurology

## 2012-09-20 ENCOUNTER — Telehealth: Payer: Self-pay | Admitting: Neurology

## 2012-09-20 LAB — METHYLMALONIC ACID, SERUM: Methylmalonic Acid: 199 nmol/L (ref 0–378)

## 2012-09-20 LAB — RPR: RPR: NONREACTIVE

## 2012-09-20 LAB — TSH: TSH: 0.818 u[IU]/mL (ref 0.450–4.500)

## 2012-09-20 NOTE — Progress Notes (Signed)
Quick Note:  Spoke with patient and relayed results of blood work. The patient was also reminded of any future appointments. [name] did inquire about the results of her recent CT (09/08/12). Please advise.  ______

## 2012-09-20 NOTE — Telephone Encounter (Signed)
Message copied by Stephanie Acre on Wed Sep 20, 2012 10:20 AM ------      Message from: Avie Echevaria      Created: Wed Sep 20, 2012  8:46 AM       Spoke with patient and relayed results of blood work.  The patient was also reminded of any future appointments.  Ms Mitter did inquire about the results of her recent CT (09/08/12).  Please advise.       ------

## 2012-09-20 NOTE — Telephone Encounter (Signed)
I called patient. The CT scan of the brain appears to be unremarkable by my reading. I will call the patient back if the full reading is different. We will follow the patient's memory issues over time, if progression is noted, we will start medications for memory.

## 2012-09-22 ENCOUNTER — Ambulatory Visit (INDEPENDENT_AMBULATORY_CARE_PROVIDER_SITE_OTHER): Payer: Medicare Other | Admitting: Pharmacist Clinician (PhC)/ Clinical Pharmacy Specialist

## 2012-09-22 VITALS — BP 150/60 | HR 68

## 2012-09-22 DIAGNOSIS — Z954 Presence of other heart-valve replacement: Secondary | ICD-10-CM

## 2012-09-22 DIAGNOSIS — Z952 Presence of prosthetic heart valve: Secondary | ICD-10-CM

## 2012-09-22 DIAGNOSIS — Z7901 Long term (current) use of anticoagulants: Secondary | ICD-10-CM

## 2012-10-13 ENCOUNTER — Ambulatory Visit (INDEPENDENT_AMBULATORY_CARE_PROVIDER_SITE_OTHER): Payer: Medicare Other | Admitting: Pharmacist Clinician (PhC)/ Clinical Pharmacy Specialist

## 2012-10-13 VITALS — BP 112/52 | HR 72

## 2012-10-13 DIAGNOSIS — Z952 Presence of prosthetic heart valve: Secondary | ICD-10-CM

## 2012-10-13 DIAGNOSIS — Z7901 Long term (current) use of anticoagulants: Secondary | ICD-10-CM

## 2012-10-13 DIAGNOSIS — Z954 Presence of other heart-valve replacement: Secondary | ICD-10-CM

## 2012-10-13 LAB — POCT INR: INR: 4.5

## 2012-10-20 ENCOUNTER — Ambulatory Visit: Payer: Medicare Other | Admitting: Pharmacist Clinician (PhC)/ Clinical Pharmacy Specialist

## 2012-10-25 ENCOUNTER — Ambulatory Visit (INDEPENDENT_AMBULATORY_CARE_PROVIDER_SITE_OTHER): Payer: Medicare Other

## 2012-10-25 VITALS — BP 133/53 | HR 83 | Resp 16 | Ht 63.0 in | Wt 177.0 lb

## 2012-10-25 DIAGNOSIS — B351 Tinea unguium: Secondary | ICD-10-CM

## 2012-10-25 DIAGNOSIS — L03039 Cellulitis of unspecified toe: Secondary | ICD-10-CM

## 2012-10-25 DIAGNOSIS — Z79899 Other long term (current) drug therapy: Secondary | ICD-10-CM

## 2012-10-25 NOTE — Patient Instructions (Signed)

## 2012-10-25 NOTE — Progress Notes (Signed)
  Subjective:    Patient ID: [name], female    DOB: 10/29/1940, 72 y.o.   MRN: [id number]  HPI Comments: It's doing much better. This patient presents 2 weeks after initial visit for avulsion of left hallux nail/nail plate. Patient is doing well no complaint of pain tenderness or discomfort. No discharge or drainage. Nail bed clean dry no signs of infection.    Review of Systems  Constitutional: Negative.   HENT: Positive for sinus pressure and sore throat.   Eyes: Negative.   Respiratory: Negative.   Cardiovascular: Positive for chest pain, palpitations and leg swelling.  Endocrine: Negative.   Genitourinary: Negative.   Musculoskeletal: Positive for arthralgias and joint swelling.  Skin: Negative.   Allergic/Immunologic: Positive for immunocompromised state.  Neurological: Negative.   Hematological: Bruises/bleeds easily.  Psychiatric/Behavioral: Positive for sleep disturbance.       Objective:   Physical ExamNeurovascular status unchanged pulses diminished patient is [facility] as previously instructed. Temperature the feet is warm to cool turgor diminished. Capillary refill timed 3-4 seconds all digits. The left hallux nail that has a good healed nail bed no discharge or drainage. Mild digital contractures are noted. No open wounds or ulcerations at this time.        Assessment & Plan:  Resolve abscess/ulceration of left hallux nail bed. Patient will followup in 2 months for diabetic foot and nail care and palliative care in the future as needed. Maintain Silvadene and Band-Aid dressing for another week. Maintain a comminuted shoe here at all time.  Alvan Dame DPM

## 2012-10-26 ENCOUNTER — Ambulatory Visit (INDEPENDENT_AMBULATORY_CARE_PROVIDER_SITE_OTHER): Payer: Medicare Other | Admitting: Pharmacist Clinician (PhC)/ Clinical Pharmacy Specialist

## 2012-10-26 VITALS — BP 146/60 | HR 80

## 2012-10-26 DIAGNOSIS — Z952 Presence of prosthetic heart valve: Secondary | ICD-10-CM

## 2012-10-26 DIAGNOSIS — Z7901 Long term (current) use of anticoagulants: Secondary | ICD-10-CM

## 2012-10-26 DIAGNOSIS — Z954 Presence of other heart-valve replacement: Secondary | ICD-10-CM

## 2012-10-26 LAB — POCT INR: INR: 3.4

## 2012-11-08 ENCOUNTER — Encounter: Payer: Self-pay | Admitting: Cardiovascular Disease

## 2012-11-08 ENCOUNTER — Ambulatory Visit (INDEPENDENT_AMBULATORY_CARE_PROVIDER_SITE_OTHER): Payer: Medicare Other | Admitting: Cardiovascular Disease

## 2012-11-08 VITALS — BP 110/52 | HR 79 | Ht 62.0 in | Wt 174.7 lb

## 2012-11-08 DIAGNOSIS — K922 Gastrointestinal hemorrhage, unspecified: Secondary | ICD-10-CM

## 2012-11-08 DIAGNOSIS — I2589 Other forms of chronic ischemic heart disease: Secondary | ICD-10-CM

## 2012-11-08 DIAGNOSIS — I255 Ischemic cardiomyopathy: Secondary | ICD-10-CM

## 2012-11-08 DIAGNOSIS — Z01810 Encounter for preprocedural cardiovascular examination: Secondary | ICD-10-CM

## 2012-11-08 DIAGNOSIS — I469 Cardiac arrest, cause unspecified: Secondary | ICD-10-CM

## 2012-11-08 DIAGNOSIS — I2510 Atherosclerotic heart disease of native coronary artery without angina pectoris: Secondary | ICD-10-CM

## 2012-11-08 NOTE — Patient Instructions (Signed)
STOP Isosorbide.  You will need Lovenox Bridging prior to your knee replacement.  Dr. Royann Shivers  recommends that you schedule a follow-up appointment in: One year.

## 2012-11-09 DIAGNOSIS — Z01810 Encounter for preprocedural cardiovascular examination: Secondary | ICD-10-CM | POA: Insufficient documentation

## 2012-11-09 NOTE — Assessment & Plan Note (Addendum)
Avoid QT prolonging agents

## 2012-11-09 NOTE — Assessment & Plan Note (Signed)
All in all, her complex cardiovascular problems place her at moderate risk of complications even with orthopedic surgery, which is typically not a risky intervention. The presence of the mechanical aortic valve, extensive coronary issues, history of cardiac arrest and depressed left ventricle systolic function all increase her risk. On the other hand she appears to be hemodynamically optimized at this time and I do not think there are other interventions that we could implement it would further mitigate the risk. She makes it very clear that the pain in her knee is severe and makes life intolerable and is willing to take the increased risk of surgery to improve her quality of life.

## 2012-11-09 NOTE — Progress Notes (Signed)
Patient ID: [name], female   DOB: August 09, 1940, 72 y.o.   MRN: [id number]      Reason for office visit Operative cardiovascular risk assessment Status post aortic valve replacement, ischemic cardiomyopathy, CAD status post drug-eluting stent March 2014  Bases feeling well. She does not have cardiovascular complaints. She has severe pain in her right knee which significantly limits her ability to engage in any of the activities that she enjoys. She wants to have knee replacement and has seen Dr. Cleophas Dunker to discuss this. In March of this year she presented with an acute coronary syndrome which was complicated by a cardiac arrest (ischemic torsades de pointes) and received a drug-eluting stent to a protected left main coronary artery. She has moderately depressed left ventricular systolic function with an ejection fraction of around 40%. She has well compensated congestive heart failure. She has a mechanical aortic valve for stasis that was implanted in 1991 and appears to be functioning normally. He has undergone 2 separate coronary bypass procedures, most recently in 2006. She has not had problems with congestive heart failure or arrhythmia since the March admission.   Allergies  Allergen Reactions  . Shellfish Allergy Anaphylaxis  . Codeine     Feels sick    Current Outpatient Prescriptions  Medication Sig Dispense Refill  . atorvastatin (LIPITOR) 40 MG tablet Take 40 mg by mouth daily.      . calcium carbonate (OS-CAL) 600 MG TABS Take 600 mg by mouth 2 (two) times daily.        . clopidogrel (PLAVIX) 75 MG tablet Take 1 tablet (75 mg total) by mouth daily with breakfast.  90 tablet  3  . fenofibrate (TRICOR) 145 MG tablet Take 145 mg by mouth daily.      . furosemide (LASIX) 40 MG tablet Take 1 tablet (40 mg total) by mouth 2 (two) times daily.  60 tablet  3  . gabapentin (NEURONTIN) 300 MG capsule Take 300 mg by mouth 3 (three) times daily.       Marland Kitchen HYDROcodone-acetaminophen  (NORCO) 7.5-325 MG per tablet Take 1 tablet by mouth at bedtime as needed. For pain      . insulin aspart (NOVOLOG) 100 UNIT/ML injection Inject 0-15 Units into the skin 3 (three) times daily with meals.  1 vial  5  . insulin glargine (LANTUS) 100 UNIT/ML injection Inject 0.6 mLs (60 Units total) into the skin daily with breakfast.  10 mL  5  . losartan (COZAAR) 100 MG tablet Take 100 mg by mouth daily.      . methotrexate (RHEUMATREX) 2.5 MG tablet Take 15 mg by mouth once a week. Thursdays.Caution:Chemotherapy. Protect from light.      . metoprolol succinate (TOPROL-XL) 25 MG 24 hr tablet Take 1 tablet (25 mg total) by mouth 2 (two) times daily.  180 tablet  3  . Multiple Vitamin (MULTIVITAMIN) capsule Take 1 capsule by mouth daily.      . nitroGLYCERIN (NITROSTAT) 0.4 MG SL tablet Place 1 tablet (0.4 mg total) under the tongue every 5 (five) minutes x 3 doses as needed for chest pain.  25 tablet  5  . potassium chloride SA (K-DUR,KLOR-CON) 20 MEQ tablet Take 20 mEq by mouth every Monday, Wednesday, and Friday.      . temazepam (RESTORIL) 30 MG capsule Take 1 capsule (30 mg total) by mouth at bedtime as needed for sleep.  30 capsule  5  . vitamin B-12 (CYANOCOBALAMIN) 1000 MCG tablet Take 1,000 mcg  by mouth 2 (two) times daily.        . VOLTAREN 1 % GEL Apply 1 application topically daily as needed.      . warfarin (COUMADIN) 5 MG tablet Take 2.5-5 mg by mouth daily. Monday and Friday take 1 tablet (5 mg), Sunday, Tuesday, Wednesday, Thursday, Saturday take half-tablet (2.5 mg).      . [DISCONTINUED] famotidine (PEPCID) 20 MG tablet Take 20 mg by mouth at bedtime.      . [DISCONTINUED] omeprazole (PRILOSEC OTC) 20 MG tablet Take 20 mg by mouth daily before breakfast.       No current facility-administered medications for this visit.    Past Medical History  Diagnosis Date  . High blood pressure   . Angina at rest   . Irregular heart rhythm   . Diabetes mellitus   . High cholesterol   .  Coronary artery disease   . COPD (chronic obstructive pulmonary disease)   . Anemia   . Irregular heart beat   . Myocardial infarction march 2013  . Peripheral vascular disease   . Cardiac arrest   . CHF (congestive heart failure) 01/06/12    Transthoracic Echocardiogram EF 45-50%  . Memory deficits 09/06/2012  . Polyneuropathy in diabetes(357.2)   . Degenerative arthritis   . Fibromyalgia     Past Surgical History  Procedure Laterality Date  . Gallbadder  2003  . Cardiac valve replacement  1991  . Heart bypass  1991/2005  . Angioplasty    . Coronary artery bypass graft    . Aortic valve replacement    . Appendectomy    . Abdominal hysterectomy    . Cholecystectomy    . Esophagogastroduodenoscopy  03/31/2011    Procedure: ESOPHAGOGASTRODUODENOSCOPY (EGD);  Surgeon: Beverley Fiedler, MD;  Location: [facility] ENDOSCOPY;  Service: Gastroenterology;  Laterality: N/A;  . Bypass graft  1991 and 2005  . Toenail avulsion Left     Family History  Problem Relation Age of Onset  . Rheum arthritis Mother   . Cervical cancer Mother   . Stomach cancer Mother   . Cancer Mother   . Hyperlipidemia Mother   . Hypertension Mother   . Rheum arthritis Father   . Rheum arthritis Paternal Aunt   . Anesthesia problems Neg Hx   . Deep vein thrombosis Brother   . Hypertension Brother   . Hypertension Daughter     History   Social History  . Marital Status: Married    Spouse Name: N/A    Number of Children: 3  . Years of Education: N/A   Occupational History  . retired     bed control at [employer]   Social History Main Topics  . Smoking status: Former Smoker -- 0.50 packs/day for 15 years    Types: Cigarettes    Quit date: 01/18/1973  . Smokeless tobacco: Never Used  . Alcohol Use: No  . Drug Use: No  . Sexual Activity: Not on file   Other Topics Concern  . Not on file   Social History Narrative  . No narrative on file    Review of systems: The patient specifically  denies any chest pain at rest or with exertion, dyspnea at rest or with usual exertion, orthopnea, paroxysmal nocturnal dyspnea, syncope, palpitations, focal neurological deficits, intermittent claudication, lower extremity edema, unexplained weight gain, cough, hemoptysis or wheezing.  The patient also denies abdominal pain, nausea, vomiting, dysphagia, diarrhea, constipation, polyuria, polydipsia, dysuria, hematuria, frequency, urgency, abnormal bleeding  or bruising, fever, chills, unexpected weight changes, mood swings, change in skin or hair texture, change in voice quality, auditory or visual problems, allergic reactions or rashes, new musculoskeletal complaints other than usual "aches and pains".   PHYSICAL EXAM BP 110/52  Pulse 79  Ht 5\' 2"  (1.575 m)  Wt 174 lb 11.2 oz (79.243 kg)  BMI 31.94 kg/m2  General: Alert, oriented x3, no distress Head: no evidence of trauma, PERRL, EOMI, no exophtalmos or lid lag, no myxedema, no xanthelasma; normal ears, nose and oropharynx Neck: normal jugular venous pulsations and no hepatojugular reflux; brisk carotid pulses without delay and no carotid bruits Chest: clear to auscultation, no signs of consolidation by percussion or palpation, normal fremitus, symmetrical and full respiratory excursions Cardiovascular: normal position and quality of the apical impulse, regular rhythm, normal first and second heart sounds, no murmurs, rubs or gallops Abdomen: no tenderness or distention, no masses by palpation, no abnormal pulsatility or arterial bruits, normal bowel sounds, no hepatosplenomegaly Extremities: no clubbing, cyanosis or edema; 2+ radial, ulnar and brachial pulses bilaterally; 2+ right femoral, posterior tibial and dorsalis pedis pulses; 2+ left femoral, posterior tibial and dorsalis pedis pulses; no subclavian or femoral bruits Neurological: grossly nonfocal   EKG: Sinus rhythm, left ventricular hypertrophy with profound repolarization  abnormalities, not much change from previous tracings) overlying QT interval around 450 mg a  Lipid Panel     Component Value Date/Time   CHOL  Value: 131        ATP III CLASSIFICATION:  <200     mg/dL   Desirable  865-784  mg/dL   Borderline High  >=696    mg/dL   High        29/52/8413 0614   TRIG 136 12/17/2009 0614   HDL 40 12/17/2009 0614   CHOLHDL 3.3 12/17/2009 0614   VLDL 27 12/17/2009 0614   LDLCALC  Value: 64        Total Cholesterol/HDL:CHD Risk Coronary Heart Disease Risk Table                     Men   Women  1/2 Average Risk   3.4   3.3  Average Risk       5.0   4.4  2 X Average Risk   9.6   7.1  3 X Average Risk  23.4   11.0        Use the calculated Patient Ratio above and the CHD Risk Table to determine the patient's CHD Risk.        ATP III CLASSIFICATION (LDL):  <100     mg/dL   Optimal  244-010  mg/dL   Near or Above                    Optimal  130-159  mg/dL   Borderline  272-536  mg/dL   High  >644     mg/dL   Very High 03/47/4259 0614    BMET    Component Value Date/Time   NA 138 06/15/2012 1000   K 3.9 06/15/2012 1000   CL 98 06/15/2012 1000   CO2 30 06/15/2012 1000   GLUCOSE 95 06/15/2012 1000   BUN 29* 06/15/2012 1000   CREATININE 1.22* 06/15/2012 1000   CREATININE 1.26* 04/05/2012 0525   CALCIUM 10.3 06/15/2012 1000   GFRNONAA 42* 04/05/2012 0525   GFRAA 48* 04/05/2012 0525     ASSESSMENT AND PLAN GI bleed, Hgb 7.8 on admission  Her prosthetic valve is functioning normally by recent echocardiographic assessment. Will need to "bridge" with low molecular heparin around the time of her knee replacements to minimize the duration that she's not protected with anticoagulants.  Cardiac arrest 04/01/12 Avoid QT prolonging agents  CAD, CABG 1991, redo 2006, S/P DES to Left Main 04/01/12 It has now been over 6 months since her recent acute coronary syndrome and revascularization procedure. She has no symptoms of acute coronary insufficiency The risk of coronary events is  probably at the lowest we can achieve at this time. I think it will be safe to temporarily interrupt antiplatelet therapy for her knee replacement surgery, but will probably recommend resumption of antiplatelet therapy for another 6 months thereafter. Delaying her knee surgery further will probably not reduce the risk of coronary events perioperatively. On the other hand, there is a theoretical risk of restenosis in the next 6-12 months and we might miss her a "window of opportunity" by delaying the surgery.  Preoperative cardiovascular examination All in all, her complex cardiovascular problems place her at moderate risk of complications even with orthopedic surgery, which is typically not a risky intervention. The presence of the mechanical aortic valve, extensive coronary issues, history of cardiac arrest and depressed left ventricle systolic function all increase her risk. On the other hand she appears to be hemodynamically optimized at this time and I do not think there are other interventions that we could implement it would further mitigate the risk. She makes it very clear that the pain in her knee is severe and makes life intolerable and is willing to take the increased risk of surgery to improve her quality of life.  Cardiomyopathy, ischemic, EF 35-40% 2D 03/31/12 She does not have any signs of active heart failure at this time. She appears to be clinically euvolemic. Functional status assessment is difficult since her knee is clearly the major limiting problem when it comes to physical activity.   she has not had any angina pectoris for at least the last 7 months and I recommend that she discontinue her isosorbide. She specifically asked whether some of her medications could be discontinued. I really do not see any other options to simplify her cardiac regimen Orders Placed This Encounter  Procedures  . EKG 12-Lead   Meds ordered this encounter  Medications  . atorvastatin (LIPITOR) 40 MG  tablet    Sig: Take 40 mg by mouth daily.  STOP isosorbide mononitrate  Novice Vrba  Thurmon Fair, MD, [facility] [phone] office [phone] pager

## 2012-11-09 NOTE — Assessment & Plan Note (Signed)
She does not have any signs of active heart failure at this time. She appears to be clinically euvolemic. Functional status assessment is difficult since her knee is clearly the major limiting problem when it comes to physical activity.

## 2012-11-09 NOTE — Assessment & Plan Note (Signed)
Her prosthetic valve is functioning normally by recent echocardiographic assessment. Will need to "bridge" with low molecular heparin around the time of her knee replacements to minimize the duration that she's not protected with anticoagulants.

## 2012-11-09 NOTE — Assessment & Plan Note (Signed)
It has now been over 6 months since her recent acute coronary syndrome and revascularization procedure. She has no symptoms of acute coronary insufficiency The risk of coronary events is probably at the lowest we can achieve at this time. I think it will be safe to temporarily interrupt antiplatelet therapy for her knee replacement surgery, but will probably recommend resumption of antiplatelet therapy for another 6 months thereafter. Delaying her knee surgery further will probably not reduce the risk of coronary events perioperatively. On the other hand, there is a theoretical risk of restenosis in the next 6-12 months and we might miss her a "window of opportunity" by delaying the surgery.

## 2012-11-16 ENCOUNTER — Telehealth: Payer: Self-pay | Admitting: Neurology

## 2012-11-20 NOTE — Telephone Encounter (Signed)
Message copied by Hermenia Fiscal on Mon Nov 20, 2012  9:56 AM ------      Message from: Seth Bake      Created: Fri Nov 17, 2012  3:37 PM        Lolita Cram, patients daughter, calling again.  She wants to speak to someone about patients MRI results.  She is concerned because patient has an upcoming surgery but her mother told her the MRI indicated TIA's.   Please call.  [phone] ------

## 2012-11-20 NOTE — Telephone Encounter (Signed)
I called daughter and relayed to her the last note Dr. Anne Hahn spoke to pt.  No CT changes.    Per daughter, [name], she is not taking the increased dose of cymbalta, or her cholesterol medication. She is to have L knee replacement surgery by Dr. Cleophas Dunker in the near future.  Is she cleared from your standpoint?  She is having increasing agitation, lashing out per daughter.

## 2012-11-20 NOTE — Telephone Encounter (Signed)
I called the daughter. The patient appears to have some mild agitation episodes. There is no direct contraindications to having surgery from a neurologic standpoint, but the behavior issues may worsen with the surgery and with the pain medications required after surgery. This may need to be medically managed if this does occur.

## 2012-11-23 ENCOUNTER — Ambulatory Visit (INDEPENDENT_AMBULATORY_CARE_PROVIDER_SITE_OTHER): Payer: Medicare Other | Admitting: Pharmacist Clinician (PhC)/ Clinical Pharmacy Specialist

## 2012-11-23 VITALS — BP 130/60 | HR 76

## 2012-11-23 DIAGNOSIS — Z952 Presence of prosthetic heart valve: Secondary | ICD-10-CM

## 2012-11-23 DIAGNOSIS — Z7901 Long term (current) use of anticoagulants: Secondary | ICD-10-CM

## 2012-11-23 DIAGNOSIS — Z954 Presence of other heart-valve replacement: Secondary | ICD-10-CM

## 2012-11-23 LAB — POCT INR: INR: 4.1

## 2012-11-23 MED ORDER — WARFARIN SODIUM 2.5 MG PO TABS: 2.5000 mg | ORAL_TABLET | Freq: Every day | ORAL | Status: DC

## 2012-12-21 ENCOUNTER — Ambulatory Visit (INDEPENDENT_AMBULATORY_CARE_PROVIDER_SITE_OTHER): Payer: Medicare Other | Admitting: Pharmacist Clinician (PhC)/ Clinical Pharmacy Specialist

## 2012-12-21 VITALS — BP 130/52 | HR 80

## 2012-12-21 DIAGNOSIS — Z954 Presence of other heart-valve replacement: Secondary | ICD-10-CM

## 2012-12-21 DIAGNOSIS — Z952 Presence of prosthetic heart valve: Secondary | ICD-10-CM

## 2012-12-21 DIAGNOSIS — Z7901 Long term (current) use of anticoagulants: Secondary | ICD-10-CM

## 2012-12-26 ENCOUNTER — Ambulatory Visit (INDEPENDENT_AMBULATORY_CARE_PROVIDER_SITE_OTHER): Payer: Medicare Other

## 2012-12-26 VITALS — BP 154/54 | HR 77 | Resp 16 | Ht 62.0 in | Wt 178.0 lb

## 2012-12-26 DIAGNOSIS — B351 Tinea unguium: Secondary | ICD-10-CM

## 2012-12-26 DIAGNOSIS — E1142 Type 2 diabetes mellitus with diabetic polyneuropathy: Secondary | ICD-10-CM

## 2012-12-26 DIAGNOSIS — E1149 Type 2 diabetes mellitus with other diabetic neurological complication: Secondary | ICD-10-CM

## 2012-12-26 DIAGNOSIS — M79609 Pain in unspecified limb: Secondary | ICD-10-CM

## 2012-12-26 DIAGNOSIS — E1140 Type 2 diabetes mellitus with diabetic neuropathy, unspecified: Secondary | ICD-10-CM

## 2012-12-26 NOTE — Progress Notes (Signed)
   Subjective:    Patient ID: [name], female    DOB: April 03, 1940, 72 y.o.   MRN: [id number] "I hope he takes a look at this toe that keeps causing me problems."  HPI patient does have paronychia avulsion of the left hallux nail plate there continues to be some slight erythema the nail bed almost a blistering no active purulent discharge or drainage no malodor no ascending cellulitis noted    Review of Systems no changes in new findings noted     Objective:   Physical Exam Neurovascular status is intact and unchanged patient's pedal pulses are follows there is thready dorsalis pedis bilateral nonpalpable PT pulse bilateral absent hair growth diminished skin texture cold temperatures mild varicosities noted bilateral. Epicritic and proprioceptive sensations diminished on Semmes Weinstein testing plantar response DTRs not elicited. Dermatologically skin color pigment normal hair growth absent nails criptotic friable incurvated of the right hallux and second digits bilateral the left hallux nail that has been cleared there is a slight erythema no active discharge or drainage no signs of infection noted however some caution patient advised to continue with Silvadene and Band-Aid and daily or try at night for one to 2 weeks.       Assessment & Plan:  Assessment this time diabetes with peripheral neuropathy and today and significant angiopathy. Patient does have mycotic nails hallux and second digit right debridement remaining nails are relatively normal trophic unremarkable nonpainful systematic this time the debridement future date if needed the left hallux nail bed is to be granulating well with good pink granular bed with epithelialization noted there. Pus is slight blister with some history drainage however no active drainage noted at this time is for caution maintain Silvadene and Band-Aid for week or 2 recheck in 3 months for followup and continued palliative care.  Alvan Dame DPM

## 2012-12-26 NOTE — Patient Instructions (Signed)
Diabetes and Foot Care Diabetes may cause you to have problems because of poor blood supply (circulation) to your feet and legs. This may cause the skin on your feet to become thinner, break easier, and heal more slowly. Your skin may become dry, and the skin may peel and crack. You may also have nerve damage in your legs and feet causing decreased feeling in them. You may not notice minor injuries to your feet that could lead to infections or more serious problems. Taking care of your feet is one of the most important things you can do for yourself.  HOME CARE INSTRUCTIONS  Wear shoes at all times, even in the house. Do not go barefoot. Bare feet are easily injured.  Check your feet daily for blisters, cuts, and redness. If you cannot see the bottom of your feet, use a mirror or ask someone for help.  Wash your feet with warm water (do not use hot water) and mild soap. Then pat your feet and the areas between your toes until they are completely dry. Do not soak your feet as this can dry your skin.  Apply a moisturizing lotion or petroleum jelly (that does not contain alcohol and is unscented) to the skin on your feet and to dry, brittle toenails. Do not apply lotion between your toes.  Trim your toenails straight across. Do not dig under them or around the cuticle. File the edges of your nails with an emery board or nail file.  Do not cut corns or calluses or try to remove them with medicine.  Wear clean socks or stockings every day. Make sure they are not too tight. Do not wear knee-high stockings since they may decrease blood flow to your legs.  Wear shoes that fit properly and have enough cushioning. To break in new shoes, wear them for just a few hours a day. This prevents you from injuring your feet. Always look in your shoes before you put them on to be sure there are no objects inside.  Do not cross your legs. This may decrease the blood flow to your feet.  If you find a minor scrape,  cut, or break in the skin on your feet, keep it and the skin around it clean and dry. These areas may be cleansed with mild soap and water. Do not cleanse the area with peroxide, alcohol, or iodine.  When you remove an adhesive bandage, be sure not to damage the skin around it.  If you have a wound, look at it several times a day to make sure it is healing.  Do not use heating pads or hot water bottles. They may burn your skin. If you have lost feeling in your feet or legs, you may not know it is happening until it is too late.  Make sure your health care provider performs a complete foot exam at least annually or more often if you have foot problems. Report any cuts, sores, or bruises to your health care provider immediately. SEEK MEDICAL CARE IF:   You have an injury that is not healing.  You have cuts or breaks in the skin.  You have an ingrown nail.  You notice redness on your legs or feet.  You feel burning or tingling in your legs or feet.  You have pain or cramps in your legs and feet.  Your legs or feet are numb.  Your feet always feel cold. SEEK IMMEDIATE MEDICAL CARE IF:   There is increasing redness,   swelling, or pain in or around a wound.  There is a red line that goes up your leg.  Pus is coming from a wound.  You develop a fever or as directed by your health care provider.  You notice a bad smell coming from an ulcer or wound. Document Released: 01/02/2000 Document Revised: 09/06/2012 Document Reviewed: 06/13/2012 Exit[employer] Patient Information 2014 [employer].  

## 2013-01-04 ENCOUNTER — Ambulatory Visit (INDEPENDENT_AMBULATORY_CARE_PROVIDER_SITE_OTHER): Payer: Medicare Other | Admitting: Pharmacist Clinician (PhC)/ Clinical Pharmacy Specialist

## 2013-01-04 ENCOUNTER — Telehealth: Payer: Self-pay | Admitting: Pharmacist Clinician (PhC)/ Clinical Pharmacy Specialist

## 2013-01-04 VITALS — BP 134/54 | HR 68

## 2013-01-04 DIAGNOSIS — Z954 Presence of other heart-valve replacement: Secondary | ICD-10-CM

## 2013-01-04 DIAGNOSIS — Z7901 Long term (current) use of anticoagulants: Secondary | ICD-10-CM

## 2013-01-04 DIAGNOSIS — Z952 Presence of prosthetic heart valve: Secondary | ICD-10-CM

## 2013-01-04 LAB — POCT INR: INR: 1.7

## 2013-01-04 MED ORDER — ENOXAPARIN SODIUM 80 MG/0.8ML ~~LOC~~ SOLN: 80.0000 mg | Freq: Two times a day (BID) | SUBCUTANEOUS | Status: DC

## 2013-01-04 NOTE — Telephone Encounter (Signed)
lovenox bridging information mailed to patient and Rx for syringes sent to [facility], [place].

## 2013-01-08 ENCOUNTER — Encounter (HOSPITAL_COMMUNITY): Payer: Self-pay | Admitting: Pharmacy Technician

## 2013-01-15 NOTE — Pre-Procedure Instructions (Addendum)
Nikeshia DAYAMI TAITT  01/15/2013   Your procedure is scheduled on:  January 6   [facility] [address] at [employer] AM.  Call this number if you have problems the morning of surgery: [phone]   Remember:   Do not eat food or drink liquids after midnight.   Take these medicines the morning of surgery with A SIP OF WATER:gabapentin, metoprolol, pain med if needed       STOP all herbel meds, nsaids (aleve,naproxen,advil,ibuprofen) 5 days prior to surgery 01/18/13 including vitamins, folic acid, methotrexate, plavix and coumadin per dr Bonnetta Barry INSULIN AM Of SURGERY   Do not wear jewelry, make-up or nail polish.  Do not wear lotions, powders, or perfumes. You may wear deodorant.  Do not shave 48 hours prior to surgery. Men may shave face and neck.  Do not bring valuables to the hospital.  [facility] is not responsible                  for any belongings or valuables.               Contacts, dentures or bridgework may not be worn into surgery.  Leave suitcase in the car. After surgery it may be brought to your room.  For patients admitted to the hospital, discharge time is determined by your                treatment team.               Patients discharged the day of surgery will not be allowed to drive  home.  Name and phone number of your driver:   Special Instructions: Incentive Spirometry - Practice and bring it with you on the day of surgery. Shower using CHG 2 nights before surgery and the night before surgery.  If you shower the day of surgery use CHG.  Use special wash - you have one bottle of CHG for all showers.  You should use approximately 1/3 of the bottle for each shower.   Please read over the following fact sheets that you were given: Pain Booklet, Coughing and Deep Breathing, Blood Transfusion Information, Total Joint Packet, MRSA Information and Surgical Site Infection Prevention

## 2013-01-16 ENCOUNTER — Telehealth: Payer: Self-pay | Admitting: Cardiovascular Disease

## 2013-01-16 ENCOUNTER — Ambulatory Visit (HOSPITAL_COMMUNITY)
Admission: RE | Admit: 2013-01-16 | Discharge: 2013-01-16 | Disposition: A | Discharge status: Home / Self Care | Payer: Medicare Other | Source: Ambulatory Visit | Attending: Orthopedic Surgery | Admitting: Orthopedic Surgery

## 2013-01-16 ENCOUNTER — Encounter (HOSPITAL_COMMUNITY): Payer: Self-pay

## 2013-01-16 ENCOUNTER — Encounter (HOSPITAL_COMMUNITY)
Admission: RE | Admit: 2013-01-16 | Discharge: 2013-01-16 | Disposition: A | Discharge status: Home / Self Care | Payer: Medicare Other | Source: Ambulatory Visit | Attending: Orthopaedic Surgery | Admitting: Orthopaedic Surgery

## 2013-01-16 DIAGNOSIS — Z0183 Encounter for blood typing: Secondary | ICD-10-CM | POA: Insufficient documentation

## 2013-01-16 DIAGNOSIS — M1710 Unilateral primary osteoarthritis, unspecified knee: Secondary | ICD-10-CM | POA: Insufficient documentation

## 2013-01-16 DIAGNOSIS — Z951 Presence of aortocoronary bypass graft: Secondary | ICD-10-CM | POA: Insufficient documentation

## 2013-01-16 DIAGNOSIS — Z7902 Long term (current) use of antithrombotics/antiplatelets: Secondary | ICD-10-CM | POA: Insufficient documentation

## 2013-01-16 DIAGNOSIS — Z01812 Encounter for preprocedural laboratory examination: Secondary | ICD-10-CM | POA: Insufficient documentation

## 2013-01-16 DIAGNOSIS — J8410 Pulmonary fibrosis, unspecified: Secondary | ICD-10-CM | POA: Insufficient documentation

## 2013-01-16 DIAGNOSIS — E669 Obesity, unspecified: Secondary | ICD-10-CM | POA: Insufficient documentation

## 2013-01-16 DIAGNOSIS — Z01818 Encounter for other preprocedural examination: Secondary | ICD-10-CM | POA: Insufficient documentation

## 2013-01-16 DIAGNOSIS — I2510 Atherosclerotic heart disease of native coronary artery without angina pectoris: Secondary | ICD-10-CM | POA: Insufficient documentation

## 2013-01-16 DIAGNOSIS — I517 Cardiomegaly: Secondary | ICD-10-CM | POA: Insufficient documentation

## 2013-01-16 LAB — TYPE AND SCREEN: Antibody Screen: NEGATIVE

## 2013-01-16 LAB — COMPREHENSIVE METABOLIC PANEL
ALT: 12 U/L (ref 0–35)
AST: 23 U/L (ref 0–37)
Alkaline Phosphatase: 45 U/L (ref 39–117)
Calcium: 10.4 mg/dL (ref 8.4–10.5)
Creatinine, Ser: 1.11 mg/dL — ABNORMAL HIGH (ref 0.50–1.10)
GFR calc Af Amer: 56 mL/min — ABNORMAL LOW (ref >90)
Glucose, Bld: 103 mg/dL — ABNORMAL HIGH (ref 70–99)
Potassium: 4.3 mEq/L (ref 3.7–5.3)
Sodium: 141 mEq/L (ref 137–147)
Total Protein: 7.5 g/dL (ref 6.0–8.3)

## 2013-01-16 LAB — URINALYSIS, ROUTINE W REFLEX MICROSCOPIC
Glucose, UA: NEGATIVE mg/dL
Hgb urine dipstick: NEGATIVE
Nitrite: NEGATIVE
Specific Gravity, Urine: 1.012 (ref 1.005–1.030)
Urobilinogen, UA: 0.2 mg/dL (ref 0.0–1.0)

## 2013-01-16 LAB — PROTIME-INR: INR: 7.78 (ref 0.00–1.49)

## 2013-01-16 LAB — CBC WITH DIFFERENTIAL/PLATELET
Eosinophils Absolute: 0.3 10*3/uL (ref 0.0–0.7)
Eosinophils Relative: 3 % (ref 0–5)
HCT: 39.2 % (ref 36.0–46.0)
Hemoglobin: 12.9 g/dL (ref 12.0–15.0)
Lymphocytes Relative: 15 % (ref 12–46)
Lymphs Abs: 1.5 10*3/uL (ref 0.7–4.0)
MCH: 32.5 pg (ref 26.0–34.0)
Monocytes Relative: 8 % (ref 3–12)
Neutrophils Relative %: 74 % (ref 43–77)
Platelets: 409 10*3/uL — ABNORMAL HIGH (ref 150–400)
RBC: 3.97 MIL/uL (ref 3.87–5.11)
WBC: 9.7 10*3/uL (ref 4.0–10.5)

## 2013-01-16 LAB — URINE MICROSCOPIC-ADD ON

## 2013-01-16 LAB — APTT: aPTT: 110 seconds — ABNORMAL HIGH (ref 24–37)

## 2013-01-16 LAB — SURGICAL PCR SCREEN: Staphylococcus aureus: NEGATIVE

## 2013-01-16 NOTE — Telephone Encounter (Signed)
Spoke with Molly Maduro at [employer]  Informed by him that INR today was 7.78.  Per Molly Maduro pt has appt today with Dr. Reuel Boom regarding infection in foot.  He was trying to reach pt and MD to let them know of elevated INR before appt - concerned if MD would do any procedure on foot with INR elevated.   I said I would also try to reach pt before appt, but definitely would speak with her today to d/c warfarin .  Is supposed to d/c after 12/31 anyway for upcoming TKA.  4:45 - spoke with patient, took keflex 250 qid x 13 days, infection improving.  Will hold warfarin for now, repeat INR on Friday, due to start Lovenox bridge over weekend.

## 2013-01-16 NOTE — Progress Notes (Addendum)
Received critical lab results. Notified [place], [place]. Spoke with Rexene Agent, [facility] who manages patients coumadin for Dr. Royann Shivers. She will make appropriate adjustments. Spoke with [place], PA for Dr. Gevena Mart to contact me regarding same. Patient reports that she has a chronically infected toe that she is going to see Dr. Lajoyce Corners for today. Requested that she notify Dr. Cleophas Dunker about visit with Dr. Lajoyce Corners. Notified CMA of Dr. Lajoyce Corners. I attempted to contact patient before appointment and was unable to get patient will defer patient notification to [place], [facility].

## 2013-01-17 ENCOUNTER — Encounter (HOSPITAL_COMMUNITY): Payer: Self-pay

## 2013-01-17 LAB — URINE CULTURE
Colony Count: NO GROWTH
Culture: NO GROWTH

## 2013-01-17 NOTE — Progress Notes (Signed)
Anesthesia Chart Review:  Patient is a 72 year old female scheduled for right TKR on 01/23/13 by Dr. Cleophas Dunker.    History includes former smoker, obesity, CAD s/p CABG with [employer] Jude AVR '91, stents in '97, and redo CABG '05, admission for acute CHF with NSTEMI with ischemic torsades de pointes cardiac arrest s/p CPR/defibrillation and DES left main 04/01/12, ischemic cardiomyopathy, chronic systolic CHF, dysrhythmia without mention of afib, DM2, HTN, hypercholesterolemia, PAD (not specified), anemia, fibromyalgia, arthritis (RA by previous cardiology notes; on methotrexate), post cath acute renal failure with peak Cr 2.28 03/2012, memory deficits, cholecystectomy.   PCP is listed as Dr. Merri Brunette. Cardiologist is Dr. Royann Shivers.  He is aware of plans for surgery.  According to his 11/08/12 note, "The risk of coronary events is probably at the lowest we can achieve at this time. I think it will be safe to temporarily interrupt antiplatelet therapy for her knee replacement surgery, but will probably recommend resumption of antiplatelet therapy for another 6 months thereafter. Delaying her knee surgery further will probably not reduce the risk of coronary events perioperatively. On the other hand, there is a theoretical risk of restenosis in the next 6-12 months and we might miss her a "window of opportunity" by delaying the surgery." He feels she is at moderate risk, and she is willing to take the increased risk of surgery to improve her quality of life.  EKG from Dr. Erin Hearing office on 06/14/12 showed NSR, pulmonary disease pattern, LVH with repolarization abnormality (diffuse T wave inversions, particularly inferolateral leads; ST segment appears elevated in V1-2 and aVR.)  EKG was felt stable.   Cardiac cath on 04/01/12 showed: The left main coronary artery has a proximal 90% stenosis. The disease extends into the proximal left circumflex. The remainder of the circumflex has mild to moderate diffuse  atherosclerosis. There is a small OM 1 and OM 2. The OM 3 is medium size. There is mild proximal disease.  Status post successful drug eluting stent placement to the left main extending into the circumflex with a 2.75 x 18 expedition drug-eluting stent, postdilated to greater than 3.3 mm in diameter.  Echo on 03/31/12 showed: - Left ventricle: The cavity size was normal. Wall thickness was increased in a pattern of mild LVH. Systolic function was moderately reduced. The estimated ejection fraction was in the range of 35% to 40%. Moderate hypokinesis of the mid-distalanteroseptal, anterior, and apical myocardium. Moderate hypokinesis of the basal-mid inferior and inferoseptal myocardium. Features are consistent with a pseudonormal left ventricular filling pattern, with concomitant abnormal relaxation and increased filling pressure (grade 2 diastolic dysfunction). Doppler parameters are consistent with elevated mean left atrial filling pressure. - Ventricular septum: Septal motion showed paradox. - Aortic valve: A mechanical prosthesis was present and functioning normally. Trivial regurgitation. Valve area: 1.19cm^2(VTI). Valve area: 2.9cm^2 (Vmean). - Mitral valve: Calcified annulus. Mildly thickened leaflets. - Left atrium: The atrium was moderately dilated. - Atrial septum: The septum bowed from left to right, consistent with increased left atrial pressure. Impressions: Compared to 12/2011, there appears to be a new inferior wall motion abnormality and the overall LV systolic function has diminished.  CXR on 01/06/13 showed no active cardiopulmonary disease.  Preoperative labs noted.  BUN/Cr 32/1.11.  H/H WNL.  She had a critical PT/INR and PTT that were already called to the [facility] pharmacist.  Patient had been on Keflex for 13 days for a foot infection which is now improving.  (Apparently, she was seen at Dr.  Whitfield's office by Dr. Lajoyce Corners yesterday for follow-up.)  Her Coumadin is now on  hold with plans to recheck on 01/19/13.  Dr. Royann Shivers has recommended a Lovenox bridge which his [facility] is managing.   She will get a repeat PT/PTT on the day of surgery.  If labs are acceptable and otherwise no acute changes or new CV/CHF symptoms then I would anticipate that she could proceed as planned.  Velna Ochs [facility]/Anesthesiology Phone [phone] 01/17/2013 1:11 PM

## 2013-01-19 ENCOUNTER — Ambulatory Visit (INDEPENDENT_AMBULATORY_CARE_PROVIDER_SITE_OTHER): Payer: Medicare Other | Admitting: Pharmacist Clinician (PhC)/ Clinical Pharmacy Specialist

## 2013-01-19 VITALS — BP 142/60 | HR 68

## 2013-01-19 DIAGNOSIS — Z954 Presence of other heart-valve replacement: Secondary | ICD-10-CM

## 2013-01-19 DIAGNOSIS — Z952 Presence of prosthetic heart valve: Secondary | ICD-10-CM

## 2013-01-19 DIAGNOSIS — Z7901 Long term (current) use of anticoagulants: Secondary | ICD-10-CM

## 2013-01-19 DIAGNOSIS — Z5181 Encounter for therapeutic drug level monitoring: Secondary | ICD-10-CM

## 2013-01-19 LAB — POCT INR: INR: 4.4

## 2013-01-19 NOTE — H&P (Signed)
CHIEF COMPLAINT:  Painful right knee.   HISTORY OF PRESENT ILLNESS:  Tigerlily is a very pleasant 73 year old white female who is seen today for evaluation of her right knee.  She has had problems with her right knee for many years, unfortunately she has progressed to the point where she is having constant knee pain with difficulty with activities of daily living.  She has pretty much now down to a moderately severe pain which is aching and throbbing with occasional sharpness to it.  It keeps her awake at nighttime.  She has had multiple injections to the knee including that of corticosteroids and viscosupplementation.  They had been at least temporarily beneficial.  She even has been tried with prednisone 10 mg daily for her rheumatoid arthritis and also for her knee pain.  It has now come to the point where she has had progressively worsening pain and discomfort and is seen today for evaluation.     HOSPITALIZATIONS:  Open heart surgery in 1992 with aortic valve replacement.  She also had CABG done.  In 2003 for cholecystectomy laparoscopic.  In 2006 for CABG.     CURRENT MEDICATIONS:   Atorvastatin 40 mg daily, multivitamin capsule daily, furosemide 40 mg 2 tabs daily, Klor-Con 20 mEq Monday, Wednesday, Friday.  Vitamin B12 tablet, 1 tablet twice a day, 2000 mcg.  Warfarin 2.5 mg tablets once daily and this has been stopped for a recent INR of 7 and greater.  Calcium 600 mg b.i.d.  Voltaren 1% gel p.r.n.  Temazepam 30 mg at bedtime p.r.n. sleep.  Nitroglycerin 0.4 mg sublingual q.5 minutes x3 p.r.n.  Norvasc 7.5/325, 1 tablet every 6 hours p.r.n.  Pennsaid 1.5% solution p.r.n.  Gabapentin 300 mg 1 tablet t.i.d.  Methotrexate 2.5 mg 6 tablets once a week at bedtime and this has also been stopped.  Folic acid 400 mcg daily.  Lantus 100 units/mL solution 60 units daily.  Humalog KwikPen 100 units/mL solution, 70 units t.i.d. a.c. sliding scale.  Losartan potassium 100 mg daily.  Fenofibrate 64 mg daily.   Metoprolol succinate 25 mg daily.  Plavix 75 mg 1 p.o. daily and this has been stopped also.     ALLERGIES:  Codeine which is all okay, but she is allowable for the other synthetic codeines.     REVIEW OF SYSTEMS:   A 14-point review of systems is negative except for glasses.  Upper and lower dentures.  Exertional shortness of breath.  Pneumonia 2 years ago.  Pleurisy 1-2 years ago.  Myocardial infarction May of this year with rest.  Hypertension has been noted and she actually had high blood pressure for 30 years.  She did have a GI bleed in 2012 and hematochezia.  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  She has had diabetes for 30 years.  She has had intermittent anemic, easy bruising and nose bleeds.  She has had gout 10 years ago in the right great toe.  She has had renal calculi 10-15 years ago.     FAMILY HISTORY:   Positive for mother deceased at age 24 from GI cancer.  Father who is deceased at age 37 from esophageal cancer.  Brother who is deceased at age 23 from a CVA and one who is living at age 65.  No sisters.     PHYSICAL EXAMINATION:  A 81 year old white married female.  Retired from [employer].  She does not drink alcohol or use tobacco.  She does have a history of 1 pack a day for 15 years and she stopped in 1980.     Height:  62.25 inches.  Weight 178 pound.  BMI is 32.6.   Vital signs:  Temperature 98.6.  Respirations 16.  Pulse 80.  Blood pressure 112/64.   Head is normocephalic.   Eyes:  Pupils are equal and round, reactive to light and accommodation with extraocular movements intact.   Ears, nose and throat were benign. Neck was supple, no bruits are noted. Chest had good expansion. Lungs are essentially clear.   Cardiac had a regular rhythm and rate.  Normal S1, S2.  No discreet murmurs, rubs or gallops appreciated.   Pulse 1+ bilateral and symmetric in the lower extremity. CNS:  Oriented x3 and cranial nerves II-XII grossly  intact. Genital, rectal and breast exam was not indicated for a orthopedic evaluation.   Musculoskeletal:  She has range of motion from about 10 degrees to 100 degrees on the right knee.  Crepitus with range of motion.  Trace to 1+ effusion.  No warmth or erythema.  This is on the right knee.  She does have opening with varus and valgus stressing.     Also noted she does have excision of right toenail and apparently there was some type of bleeding in this area, but this has certainly subsided.     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  I do not see any erythema or posterior drainage.      CLINICAL IMPRESSION:   1.  End stage OA of the right knee. 2.  History of cardiac arrest in March of 2014 with a myocardial infarction. 3.  History of hypertension. 4.  History of hepatitis from a needle stick. 5.  History of diabetes. 6.  History of gout. 7.  History of renal calculi.   RECOMMENDATIONS:  At this time because of her continued discomfort and pain and inability to have an active life without pain and discomfort, we feel that she would be a candidate for a total knee replacement.  I have reviewed from Dr. Renne Crigler at [facility] as well as Dr. Royann Shivers at [facility] in [place] that despite the fact she is at moderate risk from a cardiac standpoint, it was felt that it was an acceptable risk.  She is to continue her beta blocker throughout her postop.  Also she will be taken off her Coumadin and placed on Lovenox until after surgery and then back on the Coumadin.     Therefore our plan is for a right total knee arthroplasty in the very near future.  Procedure, risks and benefits were explained to her, her husband and her daughter.  She is understanding and all questions have been answered.  We will proceed with total joint replacement in the very near future on the right knee.  Oris Drone Aleda Grana [facility]  Orthopedics [phone]  01/19/2013 9:17 AM

## 2013-01-22 MED ORDER — CEFAZOLIN SODIUM-DEXTROSE 2-3 GM-% IV SOLR
2.0000 g | INTRAVENOUS | Status: AC
Start: 2013-01-23 — End: 2013-01-23
  Administered 2013-01-23: 2 g via INTRAVENOUS
  Filled 2013-01-22: qty 50

## 2013-01-23 ENCOUNTER — Encounter (HOSPITAL_COMMUNITY): Payer: Self-pay | Admitting: Surgery

## 2013-01-23 ENCOUNTER — Encounter (HOSPITAL_COMMUNITY): Payer: Medicare Other | Admitting: Vascular Surgery

## 2013-01-23 ENCOUNTER — Encounter (HOSPITAL_COMMUNITY)
Admission: RE | Disposition: A | Discharge status: Skilled Nursing Facility | Payer: Self-pay | Source: Ambulatory Visit | Attending: Orthopaedic Surgery

## 2013-01-23 ENCOUNTER — Inpatient Hospital Stay (HOSPITAL_COMMUNITY)
Admission: RE | Admit: 2013-01-23 | Discharge: 2013-01-27 | DRG: 470 | Disposition: A | Discharge status: Skilled Nursing Facility | Payer: Medicare Other | Source: Ambulatory Visit | Attending: Orthopaedic Surgery | Admitting: Orthopaedic Surgery

## 2013-01-23 ENCOUNTER — Inpatient Hospital Stay (HOSPITAL_COMMUNITY): Payer: Medicare Other | Admitting: Anesthesiology

## 2013-01-23 DIAGNOSIS — I5043 Acute on chronic combined systolic (congestive) and diastolic (congestive) heart failure: Secondary | ICD-10-CM

## 2013-01-23 DIAGNOSIS — I509 Heart failure, unspecified: Secondary | ICD-10-CM | POA: Diagnosis present

## 2013-01-23 DIAGNOSIS — Z9861 Coronary angioplasty status: Secondary | ICD-10-CM

## 2013-01-23 DIAGNOSIS — I2589 Other forms of chronic ischemic heart disease: Secondary | ICD-10-CM | POA: Diagnosis present

## 2013-01-23 DIAGNOSIS — I70219 Atherosclerosis of native arteries of extremities with intermittent claudication, unspecified extremity: Secondary | ICD-10-CM

## 2013-01-23 DIAGNOSIS — M7120 Synovial cyst of popliteal space [Baker], unspecified knee: Secondary | ICD-10-CM | POA: Diagnosis present

## 2013-01-23 DIAGNOSIS — E1165 Type 2 diabetes mellitus with hyperglycemia: Secondary | ICD-10-CM | POA: Diagnosis present

## 2013-01-23 DIAGNOSIS — M069 Rheumatoid arthritis, unspecified: Secondary | ICD-10-CM | POA: Diagnosis present

## 2013-01-23 DIAGNOSIS — E785 Hyperlipidemia, unspecified: Secondary | ICD-10-CM | POA: Diagnosis present

## 2013-01-23 DIAGNOSIS — Z952 Presence of prosthetic heart valve: Secondary | ICD-10-CM

## 2013-01-23 DIAGNOSIS — Z7901 Long term (current) use of anticoagulants: Secondary | ICD-10-CM

## 2013-01-23 DIAGNOSIS — N179 Acute kidney failure, unspecified: Secondary | ICD-10-CM

## 2013-01-23 DIAGNOSIS — I10 Essential (primary) hypertension: Secondary | ICD-10-CM

## 2013-01-23 DIAGNOSIS — Z800 Family history of malignant neoplasm of digestive organs: Secondary | ICD-10-CM

## 2013-01-23 DIAGNOSIS — Z8719 Personal history of other diseases of the digestive system: Secondary | ICD-10-CM

## 2013-01-23 DIAGNOSIS — I2510 Atherosclerotic heart disease of native coronary artery without angina pectoris: Secondary | ICD-10-CM | POA: Diagnosis present

## 2013-01-23 DIAGNOSIS — Z87891 Personal history of nicotine dependence: Secondary | ICD-10-CM

## 2013-01-23 DIAGNOSIS — M659 Unspecified synovitis and tenosynovitis, unspecified site: Secondary | ICD-10-CM | POA: Diagnosis present

## 2013-01-23 DIAGNOSIS — I252 Old myocardial infarction: Secondary | ICD-10-CM

## 2013-01-23 DIAGNOSIS — I255 Ischemic cardiomyopathy: Secondary | ICD-10-CM

## 2013-01-23 DIAGNOSIS — Z8674 Personal history of sudden cardiac arrest: Secondary | ICD-10-CM

## 2013-01-23 DIAGNOSIS — D62 Acute posthemorrhagic anemia: Secondary | ICD-10-CM | POA: Diagnosis not present

## 2013-01-23 DIAGNOSIS — I5189 Other ill-defined heart diseases: Secondary | ICD-10-CM | POA: Insufficient documentation

## 2013-01-23 DIAGNOSIS — Z951 Presence of aortocoronary bypass graft: Secondary | ICD-10-CM

## 2013-01-23 DIAGNOSIS — E1149 Type 2 diabetes mellitus with other diabetic neurological complication: Secondary | ICD-10-CM | POA: Diagnosis present

## 2013-01-23 DIAGNOSIS — E7800 Pure hypercholesterolemia, unspecified: Secondary | ICD-10-CM | POA: Diagnosis present

## 2013-01-23 DIAGNOSIS — IMO0002 Reserved for concepts with insufficient information to code with codable children: Secondary | ICD-10-CM

## 2013-01-23 DIAGNOSIS — Z96659 Presence of unspecified artificial knee joint: Secondary | ICD-10-CM

## 2013-01-23 DIAGNOSIS — J810 Acute pulmonary edema: Secondary | ICD-10-CM

## 2013-01-23 DIAGNOSIS — Z96651 Presence of right artificial knee joint: Secondary | ICD-10-CM

## 2013-01-23 DIAGNOSIS — M109 Gout, unspecified: Secondary | ICD-10-CM | POA: Diagnosis present

## 2013-01-23 DIAGNOSIS — E1142 Type 2 diabetes mellitus with diabetic polyneuropathy: Secondary | ICD-10-CM | POA: Diagnosis present

## 2013-01-23 DIAGNOSIS — I519 Heart disease, unspecified: Secondary | ICD-10-CM | POA: Insufficient documentation

## 2013-01-23 DIAGNOSIS — Z954 Presence of other heart-valve replacement: Secondary | ICD-10-CM

## 2013-01-23 DIAGNOSIS — R339 Retention of urine, unspecified: Secondary | ICD-10-CM | POA: Diagnosis not present

## 2013-01-23 DIAGNOSIS — Z7902 Long term (current) use of antithrombotics/antiplatelets: Secondary | ICD-10-CM

## 2013-01-23 DIAGNOSIS — M1710 Unilateral primary osteoarthritis, unspecified knee: Principal | ICD-10-CM | POA: Diagnosis present

## 2013-01-23 DIAGNOSIS — I739 Peripheral vascular disease, unspecified: Secondary | ICD-10-CM | POA: Diagnosis present

## 2013-01-23 DIAGNOSIS — J4489 Other specified chronic obstructive pulmonary disease: Secondary | ICD-10-CM | POA: Diagnosis present

## 2013-01-23 DIAGNOSIS — I469 Cardiac arrest, cause unspecified: Secondary | ICD-10-CM

## 2013-01-23 DIAGNOSIS — Z794 Long term (current) use of insulin: Secondary | ICD-10-CM

## 2013-01-23 DIAGNOSIS — J449 Chronic obstructive pulmonary disease, unspecified: Secondary | ICD-10-CM | POA: Diagnosis present

## 2013-01-23 DIAGNOSIS — M1711 Unilateral primary osteoarthritis, right knee: Secondary | ICD-10-CM

## 2013-01-23 HISTORY — DX: Type 2 diabetes mellitus without complications: E11.9

## 2013-01-23 HISTORY — PX: TOTAL KNEE ARTHROPLASTY: SHX125

## 2013-01-23 LAB — PROTIME-INR
INR: 1.09 (ref 0.00–1.49)
PROTHROMBIN TIME: 13.9 s (ref 11.6–15.2)

## 2013-01-23 LAB — CREATININE, SERUM
Creatinine, Ser: 1.02 mg/dL (ref 0.50–1.10)
GFR, EST AFRICAN AMERICAN: 62 mL/min — AB (ref >90)
GFR, EST NON AFRICAN AMERICAN: 54 mL/min — AB (ref >90)

## 2013-01-23 LAB — CBC
HEMATOCRIT: 32.7 % — AB (ref 36.0–46.0)
HEMOGLOBIN: 10.3 g/dL — AB (ref 12.0–15.0)
MCH: 31.8 pg (ref 26.0–34.0)
MCHC: 31.5 g/dL (ref 30.0–36.0)
MCV: 100.9 fL — ABNORMAL HIGH (ref 78.0–100.0)
Platelets: 302 10*3/uL (ref 150–400)
RBC: 3.24 MIL/uL — ABNORMAL LOW (ref 3.87–5.11)
RDW: 14.5 % (ref 11.5–15.5)
WBC: 10.6 10*3/uL — AB (ref 4.0–10.5)

## 2013-01-23 LAB — GLUCOSE, CAPILLARY
GLUCOSE-CAPILLARY: 260 mg/dL — AB (ref 70–99)
GLUCOSE-CAPILLARY: 335 mg/dL — AB (ref 70–99)
GLUCOSE-CAPILLARY: 402 mg/dL — AB (ref 70–99)
GLUCOSE-CAPILLARY: 293 mg/dL — AB (ref 70–99)
Glucose-Capillary: 284 mg/dL — ABNORMAL HIGH (ref 70–99)

## 2013-01-23 LAB — HEMOGLOBIN A1C
Hgb A1c MFr Bld: 8.9 % — ABNORMAL HIGH (ref <5.7)
MEAN PLASMA GLUCOSE: 209 mg/dL — AB (ref <117)

## 2013-01-23 LAB — APTT: aPTT: 26 seconds (ref 24–37)

## 2013-01-23 SURGERY — ARTHROPLASTY, KNEE, TOTAL
Anesthesia: General | Site: Knee | Laterality: Right

## 2013-01-23 MED ORDER — ENOXAPARIN SODIUM 30 MG/0.3ML ~~LOC~~ SOLN
30.0000 mg | SUBCUTANEOUS | Status: DC
  Filled 2013-01-23: qty 0.3

## 2013-01-23 MED ORDER — WARFARIN - PHARMACIST DOSING INPATIENT: Freq: Every day | Status: DC

## 2013-01-23 MED ORDER — METHOCARBAMOL 500 MG PO TABS
ORAL_TABLET | ORAL | Status: AC
  Filled 2013-01-23: qty 1

## 2013-01-23 MED ORDER — FOLIC ACID 400 MCG PO TABS: 400.0000 ug | ORAL_TABLET | Freq: Every day | ORAL | Status: DC

## 2013-01-23 MED ORDER — ESMOLOL HCL 10 MG/ML IV SOLN
INTRAVENOUS | Status: DC | PRN
  Administered 2013-01-23: 20 mg via INTRAVENOUS

## 2013-01-23 MED ORDER — MENTHOL 3 MG MT LOZG: 1.0000 | LOZENGE | OROMUCOSAL | Status: DC | PRN

## 2013-01-23 MED ORDER — OXYCODONE HCL 5 MG PO TABS: 5.0000 mg | ORAL_TABLET | Freq: Once | ORAL | Status: DC | PRN

## 2013-01-23 MED ORDER — INSULIN GLARGINE 100 UNIT/ML ~~LOC~~ SOLN
30.0000 [IU] | Freq: Every day | SUBCUTANEOUS | Status: DC
Start: 2013-01-23 — End: 2013-01-23
  Administered 2013-01-23: 30 [IU] via SUBCUTANEOUS
  Filled 2013-01-23: qty 0.3

## 2013-01-23 MED ORDER — METOCLOPRAMIDE HCL 5 MG PO TABS
5.0000 mg | ORAL_TABLET | Freq: Three times a day (TID) | ORAL | Status: DC | PRN
  Filled 2013-01-23: qty 2

## 2013-01-23 MED ORDER — FUROSEMIDE 40 MG PO TABS
40.0000 mg | ORAL_TABLET | Freq: Two times a day (BID) | ORAL | Status: DC
  Administered 2013-01-23 – 2013-01-25 (×4): 40 mg via ORAL
  Filled 2013-01-23 (×6): qty 1

## 2013-01-23 MED ORDER — LOSARTAN POTASSIUM 50 MG PO TABS
100.0000 mg | ORAL_TABLET | Freq: Every day | ORAL | Status: DC
  Administered 2013-01-24: 100 mg via ORAL
  Filled 2013-01-23 (×2): qty 2

## 2013-01-23 MED ORDER — THROMBIN 20000 UNITS EX KIT
PACK | CUTANEOUS | Status: AC
  Filled 2013-01-23: qty 1

## 2013-01-23 MED ORDER — ACETAMINOPHEN 325 MG PO TABS
650.0000 mg | ORAL_TABLET | Freq: Four times a day (QID) | ORAL | Status: DC | PRN
  Administered 2013-01-24 – 2013-01-25 (×3): 650 mg via ORAL
  Filled 2013-01-23 (×4): qty 2

## 2013-01-23 MED ORDER — NITROGLYCERIN 0.4 MG SL SUBL: 0.4000 mg | SUBLINGUAL_TABLET | SUBLINGUAL | Status: DC | PRN

## 2013-01-23 MED ORDER — INSULIN ASPART 100 UNIT/ML ~~LOC~~ SOLN: 0.0000 [IU] | Freq: Three times a day (TID) | SUBCUTANEOUS | Status: DC

## 2013-01-23 MED ORDER — ACETAMINOPHEN 10 MG/ML IV SOLN
INTRAVENOUS | Status: DC | PRN
  Administered 2013-01-23: 1000 mg via INTRAVENOUS

## 2013-01-23 MED ORDER — BUPIVACAINE-EPINEPHRINE 0.25% -1:200000 IJ SOLN
INTRAMUSCULAR | Status: DC | PRN
  Administered 2013-01-23: 30 mL

## 2013-01-23 MED ORDER — BISACODYL 10 MG RE SUPP: 10.0000 mg | Freq: Every day | RECTAL | Status: DC | PRN

## 2013-01-23 MED ORDER — METOCLOPRAMIDE HCL 5 MG/ML IJ SOLN: 10.0000 mg | Freq: Once | INTRAMUSCULAR | Status: DC | PRN

## 2013-01-23 MED ORDER — PHENOL 1.4 % MT LIQD: 1.0000 | OROMUCOSAL | Status: DC | PRN

## 2013-01-23 MED ORDER — ONDANSETRON HCL 4 MG/2ML IJ SOLN
4.0000 mg | Freq: Four times a day (QID) | INTRAMUSCULAR | Status: DC | PRN
  Filled 2013-01-23: qty 2

## 2013-01-23 MED ORDER — INSULIN ASPART 100 UNIT/ML ~~LOC~~ SOLN
0.0000 [IU] | Freq: Every day | SUBCUTANEOUS | Status: DC
  Administered 2013-01-23: 3 [IU] via SUBCUTANEOUS

## 2013-01-23 MED ORDER — ENOXAPARIN SODIUM 40 MG/0.4ML ~~LOC~~ SOLN
40.0000 mg | SUBCUTANEOUS | Status: DC
  Administered 2013-01-24: 40 mg via SUBCUTANEOUS
  Filled 2013-01-23 (×2): qty 0.4

## 2013-01-23 MED ORDER — DOCUSATE SODIUM 100 MG PO CAPS
100.0000 mg | ORAL_CAPSULE | Freq: Two times a day (BID) | ORAL | Status: DC
  Administered 2013-01-23 – 2013-01-27 (×8): 100 mg via ORAL
  Filled 2013-01-23 (×10): qty 1

## 2013-01-23 MED ORDER — KETOROLAC TROMETHAMINE 15 MG/ML IJ SOLN
15.0000 mg | Freq: Four times a day (QID) | INTRAMUSCULAR | Status: AC
  Administered 2013-01-23 (×2): 15 mg via INTRAVENOUS
  Filled 2013-01-23 (×4): qty 1

## 2013-01-23 MED ORDER — INSULIN ASPART 100 UNIT/ML ~~LOC~~ SOLN
0.0000 [IU] | Freq: Three times a day (TID) | SUBCUTANEOUS | Status: DC
Start: 2013-01-23 — End: 2013-01-24
  Administered 2013-01-23: 15 [IU] via SUBCUTANEOUS
  Administered 2013-01-23: 11 [IU] via SUBCUTANEOUS
  Administered 2013-01-24: 3 [IU] via SUBCUTANEOUS

## 2013-01-23 MED ORDER — DEXAMETHASONE SODIUM PHOSPHATE 4 MG/ML IJ SOLN
INTRAMUSCULAR | Status: DC | PRN
  Administered 2013-01-23: 4 mg via INTRAVENOUS

## 2013-01-23 MED ORDER — ALUM & MAG HYDROXIDE-SIMETH 200-200-20 MG/5ML PO SUSP: 30.0000 mL | ORAL | Status: DC | PRN

## 2013-01-23 MED ORDER — NEOSTIGMINE METHYLSULFATE 1 MG/ML IJ SOLN
INTRAMUSCULAR | Status: DC | PRN
  Administered 2013-01-23: 4 mg via INTRAVENOUS

## 2013-01-23 MED ORDER — OXYCODONE HCL 5 MG PO TABS
5.0000 mg | ORAL_TABLET | ORAL | Status: DC | PRN
  Administered 2013-01-23 – 2013-01-24 (×3): 10 mg via ORAL
  Administered 2013-01-24 (×2): 5 mg via ORAL
  Administered 2013-01-24 (×3): 10 mg via ORAL
  Administered 2013-01-25 (×2): 5 mg via ORAL
  Administered 2013-01-25: 10 mg via ORAL
  Filled 2013-01-23 (×2): qty 2
  Filled 2013-01-23: qty 1
  Filled 2013-01-23 (×3): qty 2
  Filled 2013-01-23 (×2): qty 1
  Filled 2013-01-23 (×3): qty 2
  Filled 2013-01-23: qty 1

## 2013-01-23 MED ORDER — ACETAMINOPHEN 650 MG RE SUPP: 650.0000 mg | Freq: Four times a day (QID) | RECTAL | Status: DC | PRN

## 2013-01-23 MED ORDER — ZOLPIDEM TARTRATE 5 MG PO TABS: 5.0000 mg | ORAL_TABLET | Freq: Every evening | ORAL | Status: DC | PRN

## 2013-01-23 MED ORDER — GABAPENTIN 300 MG PO CAPS
300.0000 mg | ORAL_CAPSULE | Freq: Three times a day (TID) | ORAL | Status: DC
  Administered 2013-01-23 – 2013-01-27 (×11): 300 mg via ORAL
  Filled 2013-01-23 (×13): qty 1

## 2013-01-23 MED ORDER — POTASSIUM CHLORIDE CRYS ER 20 MEQ PO TBCR
20.0000 meq | EXTENDED_RELEASE_TABLET | ORAL | Status: DC
  Administered 2013-01-24 – 2013-01-26 (×2): 20 meq via ORAL
  Filled 2013-01-23 (×2): qty 1

## 2013-01-23 MED ORDER — CHLORHEXIDINE GLUCONATE 4 % EX LIQD: 60.0000 mL | Freq: Every day | CUTANEOUS | Status: DC

## 2013-01-23 MED ORDER — INSULIN GLARGINE 100 UNIT/ML ~~LOC~~ SOLN
40.0000 [IU] | Freq: Every day | SUBCUTANEOUS | Status: DC
  Administered 2013-01-24: 40 [IU] via SUBCUTANEOUS
  Filled 2013-01-23: qty 0.4

## 2013-01-23 MED ORDER — ACETAMINOPHEN 10 MG/ML IV SOLN
INTRAVENOUS | Status: AC
  Filled 2013-01-23: qty 100

## 2013-01-23 MED ORDER — PANTOPRAZOLE SODIUM 40 MG IV SOLR
40.0000 mg | INTRAVENOUS | Status: DC
  Administered 2013-01-23 – 2013-01-24 (×2): 40 mg via INTRAVENOUS
  Filled 2013-01-23 (×2): qty 40

## 2013-01-23 MED ORDER — ETOMIDATE 2 MG/ML IV SOLN
INTRAVENOUS | Status: DC | PRN
  Administered 2013-01-23: 12 mg via INTRAVENOUS

## 2013-01-23 MED ORDER — BUPIVACAINE-EPINEPHRINE (PF) 0.25% -1:200000 IJ SOLN
INTRAMUSCULAR | Status: AC
  Filled 2013-01-23: qty 30

## 2013-01-23 MED ORDER — CEFAZOLIN SODIUM-DEXTROSE 2-3 GM-% IV SOLR
2.0000 g | Freq: Four times a day (QID) | INTRAVENOUS | Status: AC
  Administered 2013-01-23 (×2): 2 g via INTRAVENOUS
  Filled 2013-01-23 (×2): qty 50

## 2013-01-23 MED ORDER — TEMAZEPAM 15 MG PO CAPS
30.0000 mg | ORAL_CAPSULE | Freq: Every evening | ORAL | Status: DC | PRN
  Administered 2013-01-23 – 2013-01-24 (×2): 30 mg via ORAL
  Filled 2013-01-23 (×2): qty 2

## 2013-01-23 MED ORDER — MAGNESIUM HYDROXIDE 400 MG/5ML PO SUSP: 30.0000 mL | Freq: Every day | ORAL | Status: DC | PRN

## 2013-01-23 MED ORDER — METHOCARBAMOL 500 MG PO TABS
500.0000 mg | ORAL_TABLET | Freq: Four times a day (QID) | ORAL | Status: DC | PRN
  Administered 2013-01-23 – 2013-01-24 (×3): 500 mg via ORAL
  Filled 2013-01-23 (×3): qty 1

## 2013-01-23 MED ORDER — WARFARIN SODIUM 4 MG PO TABS
4.0000 mg | ORAL_TABLET | Freq: Once | ORAL | Status: AC
  Administered 2013-01-23: 4 mg via ORAL
  Filled 2013-01-23: qty 1

## 2013-01-23 MED ORDER — GLYCOPYRROLATE 0.2 MG/ML IJ SOLN
INTRAMUSCULAR | Status: DC | PRN
  Administered 2013-01-23: .6 mg via INTRAVENOUS

## 2013-01-23 MED ORDER — CHLORHEXIDINE GLUCONATE 4 % EX LIQD: 60.0000 mL | Freq: Once | CUTANEOUS | Status: DC

## 2013-01-23 MED ORDER — SODIUM CHLORIDE 0.9 % IV SOLN: 75.0000 mL/h | INTRAVENOUS | Status: DC

## 2013-01-23 MED ORDER — PHENYLEPHRINE HCL 10 MG/ML IJ SOLN
10.0000 mg | INTRAVENOUS | Status: DC | PRN
  Administered 2013-01-23: 40 ug/min via INTRAVENOUS

## 2013-01-23 MED ORDER — FENTANYL CITRATE 0.05 MG/ML IJ SOLN
INTRAMUSCULAR | Status: DC | PRN
  Administered 2013-01-23 (×5): 50 ug via INTRAVENOUS

## 2013-01-23 MED ORDER — LIDOCAINE HCL (CARDIAC) 20 MG/ML IV SOLN
INTRAVENOUS | Status: DC | PRN
  Administered 2013-01-23: 50 mg via INTRAVENOUS

## 2013-01-23 MED ORDER — TEMAZEPAM 30 MG PO CAPS: 30.0000 mg | ORAL_CAPSULE | Freq: Every evening | ORAL | Status: DC | PRN

## 2013-01-23 MED ORDER — LACTATED RINGERS IV SOLN
INTRAVENOUS | Status: DC | PRN
  Administered 2013-01-23: 07:00:00 via INTRAVENOUS

## 2013-01-23 MED ORDER — ROCURONIUM BROMIDE 100 MG/10ML IV SOLN
INTRAVENOUS | Status: DC | PRN
  Administered 2013-01-23: 50 mg via INTRAVENOUS

## 2013-01-23 MED ORDER — HYDROMORPHONE HCL PF 1 MG/ML IJ SOLN
INTRAMUSCULAR | Status: AC
  Filled 2013-01-23: qty 1

## 2013-01-23 MED ORDER — FENOFIBRATE 160 MG PO TABS
160.0000 mg | ORAL_TABLET | Freq: Every day | ORAL | Status: DC
  Administered 2013-01-23 – 2013-01-26 (×4): 160 mg via ORAL
  Filled 2013-01-23 (×4): qty 1

## 2013-01-23 MED ORDER — HYDROMORPHONE HCL PF 1 MG/ML IJ SOLN
0.2500 mg | INTRAMUSCULAR | Status: DC | PRN
  Administered 2013-01-23 (×2): 0.5 mg via INTRAVENOUS

## 2013-01-23 MED ORDER — METHOCARBAMOL 100 MG/ML IJ SOLN
500.0000 mg | Freq: Four times a day (QID) | INTRAMUSCULAR | Status: DC | PRN
  Filled 2013-01-23: qty 5

## 2013-01-23 MED ORDER — ONDANSETRON HCL 4 MG PO TABS
4.0000 mg | ORAL_TABLET | Freq: Four times a day (QID) | ORAL | Status: DC | PRN
Start: 2013-01-23 — End: 2013-01-27
  Filled 2013-01-23: qty 1

## 2013-01-23 MED ORDER — FLEET ENEMA 7-19 GM/118ML RE ENEM: 1.0000 | ENEMA | Freq: Once | RECTAL | Status: AC | PRN

## 2013-01-23 MED ORDER — ACETAMINOPHEN 500 MG PO TABS
1000.0000 mg | ORAL_TABLET | Freq: Once | ORAL | Status: AC
  Administered 2013-01-23: 1000 mg via ORAL
  Filled 2013-01-23: qty 2

## 2013-01-23 MED ORDER — OXYCODONE HCL 5 MG/5ML PO SOLN: 5.0000 mg | Freq: Once | ORAL | Status: DC | PRN

## 2013-01-23 MED ORDER — FOLIC ACID 0.5 MG HALF TAB
0.5000 mg | ORAL_TABLET | Freq: Every day | ORAL | Status: DC
  Administered 2013-01-24 – 2013-01-27 (×4): 0.5 mg via ORAL
  Filled 2013-01-23 (×4): qty 1

## 2013-01-23 MED ORDER — HYDROMORPHONE HCL PF 1 MG/ML IJ SOLN
0.5000 mg | INTRAMUSCULAR | Status: DC | PRN
  Administered 2013-01-23: 0.5 mg via INTRAVENOUS
  Filled 2013-01-23: qty 1

## 2013-01-23 MED ORDER — TEMAZEPAM 15 MG PO CAPS: 30.0000 mg | ORAL_CAPSULE | Freq: Every evening | ORAL | Status: DC | PRN

## 2013-01-23 MED ORDER — METOPROLOL SUCCINATE ER 25 MG PO TB24
25.0000 mg | ORAL_TABLET | Freq: Two times a day (BID) | ORAL | Status: DC
  Administered 2013-01-23 – 2013-01-27 (×8): 25 mg via ORAL
  Filled 2013-01-23 (×9): qty 1

## 2013-01-23 MED ORDER — INSULIN ASPART 100 UNIT/ML ~~LOC~~ SOLN
5.0000 [IU] | Freq: Three times a day (TID) | SUBCUTANEOUS | Status: DC
  Administered 2013-01-23 – 2013-01-24 (×2): 5 [IU] via SUBCUTANEOUS

## 2013-01-23 MED ORDER — SODIUM CHLORIDE 0.9 % IV SOLN: INTRAVENOUS | Status: DC

## 2013-01-23 MED ORDER — ONDANSETRON HCL 4 MG/2ML IJ SOLN
INTRAMUSCULAR | Status: DC | PRN
  Administered 2013-01-23: 4 mg via INTRAVENOUS

## 2013-01-23 MED ORDER — 0.9 % SODIUM CHLORIDE (POUR BTL) OPTIME
TOPICAL | Status: DC | PRN
  Administered 2013-01-23: 1000 mL

## 2013-01-23 MED ORDER — METOCLOPRAMIDE HCL 5 MG/ML IJ SOLN
5.0000 mg | Freq: Three times a day (TID) | INTRAMUSCULAR | Status: DC | PRN
  Filled 2013-01-23: qty 2

## 2013-01-23 MED ORDER — SODIUM CHLORIDE 0.9 % IR SOLN
Status: DC | PRN
  Administered 2013-01-23 (×2): 1000 mL

## 2013-01-23 MED ORDER — MIDAZOLAM HCL 5 MG/5ML IJ SOLN
INTRAMUSCULAR | Status: DC | PRN
  Administered 2013-01-23 (×2): 1 mg via INTRAVENOUS

## 2013-01-23 SURGICAL SUPPLY — 75 items
BANDAGE ESMARK 6X9 LF (GAUZE/BANDAGES/DRESSINGS) ×1 IMPLANT
BLADE SAGITTAL 25.0X1.19X90 (BLADE) ×2 IMPLANT
BLADE SAGITTAL 25.0X1.19X90MM (BLADE) ×1
BNDG ESMARK 6X9 LF (GAUZE/BANDAGES/DRESSINGS) ×3
BOWL SMART MIX CTS (DISPOSABLE) ×3 IMPLANT
CAPT RP KNEE ×3 IMPLANT
CEMENT HV SMART SET (Cement) ×6 IMPLANT
CLOTH BEACON ORANGE TIMEOUT ST (SAFETY) ×3 IMPLANT
COVER BACK TABLE 24X17X13 BIG (DRAPES) IMPLANT
COVER SURGICAL LIGHT HANDLE (MISCELLANEOUS) ×3 IMPLANT
CUFF TOURNIQUET SINGLE 34IN LL (TOURNIQUET CUFF) ×3 IMPLANT
CUFF TOURNIQUET SINGLE 44IN (TOURNIQUET CUFF) IMPLANT
DRAPE EXTREMITY T 121X128X90 (DRAPE) ×3 IMPLANT
DRAPE PROXIMA HALF (DRAPES) ×3 IMPLANT
DRSG ADAPTIC 3X8 NADH LF (GAUZE/BANDAGES/DRESSINGS) ×3 IMPLANT
DRSG PAD ABDOMINAL 8X10 ST (GAUZE/BANDAGES/DRESSINGS) ×6 IMPLANT
DURAPREP 26ML APPLICATOR (WOUND CARE) ×6 IMPLANT
ELECT CAUTERY BLADE 6.4 (BLADE) ×3 IMPLANT
ELECT REM PT RETURN 9FT ADLT (ELECTROSURGICAL) ×3
ELECTRODE REM PT RTRN 9FT ADLT (ELECTROSURGICAL) ×1 IMPLANT
EVACUATOR 1/8 PVC DRAIN (DRAIN) IMPLANT
FACESHIELD LNG OPTICON STERILE (SAFETY) ×6 IMPLANT
FLOSEAL 10ML (HEMOSTASIS) IMPLANT
FLUID NSS /IRRIG 1000 ML XXX (MISCELLANEOUS) ×6 IMPLANT
GLOVE BIO SURGEON STRL SZ7 (GLOVE) ×3 IMPLANT
GLOVE BIOGEL PI IND STRL 7.0 (GLOVE) ×1 IMPLANT
GLOVE BIOGEL PI IND STRL 7.5 (GLOVE) ×1 IMPLANT
GLOVE BIOGEL PI IND STRL 8 (GLOVE) ×1 IMPLANT
GLOVE BIOGEL PI IND STRL 8.5 (GLOVE) ×1 IMPLANT
GLOVE BIOGEL PI INDICATOR 7.0 (GLOVE) ×2
GLOVE BIOGEL PI INDICATOR 7.5 (GLOVE) ×2
GLOVE BIOGEL PI INDICATOR 8 (GLOVE) ×2
GLOVE BIOGEL PI INDICATOR 8.5 (GLOVE) ×2
GLOVE BIOGEL PI ORTHO PRO 7.5 (GLOVE) ×2
GLOVE BIOGEL PI ORTHO PRO SZ7 (GLOVE) ×2
GLOVE ECLIPSE 8.0 STRL XLNG CF (GLOVE) ×6 IMPLANT
GLOVE PI ORTHO PRO STRL 7.5 (GLOVE) ×1 IMPLANT
GLOVE PI ORTHO PRO STRL SZ7 (GLOVE) ×1 IMPLANT
GLOVE SURG ORTHO 8.5 STRL (GLOVE) ×6 IMPLANT
GOWN L4 XXLG W/PAP TWL (GOWN DISPOSABLE) ×3 IMPLANT
GOWN PREVENTION PLUS XXLARGE (GOWN DISPOSABLE) IMPLANT
GOWN STRL NON-REIN LRG LVL3 (GOWN DISPOSABLE) IMPLANT
GOWN STRL REUS W/ TWL LRG LVL4 (GOWN DISPOSABLE) ×2 IMPLANT
GOWN STRL REUS W/TWL LRG LVL4 (GOWN DISPOSABLE) ×4
GOWN STRL REUS W/TWL XL LVL4 (GOWN DISPOSABLE) ×3 IMPLANT
HANDPIECE INTERPULSE COAX TIP (DISPOSABLE) ×2
KIT BASIN OR (CUSTOM PROCEDURE TRAY) ×3 IMPLANT
KIT ROOM TURNOVER OR (KITS) ×3 IMPLANT
MANIFOLD NEPTUNE II (INSTRUMENTS) ×3 IMPLANT
NEEDLE 22X1 1/2 (OR ONLY) (NEEDLE) IMPLANT
NS IRRIG 1000ML POUR BTL (IV SOLUTION) ×3 IMPLANT
PACK TOTAL JOINT (CUSTOM PROCEDURE TRAY) ×3 IMPLANT
PAD ARMBOARD 7.5X6 YLW CONV (MISCELLANEOUS) ×3 IMPLANT
PAD CAST 4YDX4 CTTN HI CHSV (CAST SUPPLIES) ×1 IMPLANT
PADDING CAST COTTON 4X4 STRL (CAST SUPPLIES) ×2
PADDING CAST COTTON 6X4 STRL (CAST SUPPLIES) ×3 IMPLANT
RUBBERBAND STERILE (MISCELLANEOUS) ×3 IMPLANT
SET HNDPC FAN SPRY TIP SCT (DISPOSABLE) ×1 IMPLANT
SPONGE GAUZE 4X4 12PLY (GAUZE/BANDAGES/DRESSINGS) ×3 IMPLANT
SPONGE GAUZE 4X4 12PLY STER LF (GAUZE/BANDAGES/DRESSINGS) ×3 IMPLANT
STAPLER VISISTAT 35W (STAPLE) ×3 IMPLANT
SUCTION FRAZIER TIP 10 FR DISP (SUCTIONS) ×3 IMPLANT
SUT BONE WAX W31G (SUTURE) ×3 IMPLANT
SUT ETHIBOND NAB CT1 #1 30IN (SUTURE) ×9 IMPLANT
SUT MNCRL AB 3-0 PS2 18 (SUTURE) ×3 IMPLANT
SUT VIC AB 0 CT1 27 (SUTURE) ×2
SUT VIC AB 0 CT1 27XBRD ANBCTR (SUTURE) ×1 IMPLANT
SUT VIC AB 1 CT1 27 (SUTURE) ×2
SUT VIC AB 1 CT1 27XBRD ANBCTR (SUTURE) ×1 IMPLANT
SYR CONTROL 10ML LL (SYRINGE) ×3 IMPLANT
TOWEL OR 17X24 6PK STRL BLUE (TOWEL DISPOSABLE) ×3 IMPLANT
TOWEL OR 17X26 10 PK STRL BLUE (TOWEL DISPOSABLE) ×3 IMPLANT
TRAY FOLEY CATH 16FRSI W/METER (SET/KITS/TRAYS/PACK) ×3 IMPLANT
WATER STERILE IRR 1000ML POUR (IV SOLUTION) IMPLANT
WRAP KNEE MAXI GEL POST OP (GAUZE/BANDAGES/DRESSINGS) ×3 IMPLANT

## 2013-01-23 NOTE — Progress Notes (Signed)
Orthopedic Tech Progress Note Patient Details:  [name] May 16, 1940 [id number]  CPM Right Knee CPM Right Knee: On Right Knee Flexion (Degrees): 60 Right Knee Extension (Degrees): 0 Additional Comments: Trapeze bar   Cammer, Mickie Bail 01/23/2013, 11:07 AM

## 2013-01-23 NOTE — Consult Note (Signed)
[facility] Medical Consultation  [name] TSV:[id number] DOB: Jan 23, 1940 DOA: 01/23/2013 PCP: Londell Moh, MD   Requesting physician: Cleophas Dunker  Date of consultation: 01/24/2012  Reason for consultation: Management of diabetes and other non cardiac medical problems   Impression/Recommendations Active Problems: S/P total knee replacement using cement  -per orthopedics tream  HTN (hypertension)  -moderate control immediate post op but suspect pain influencing -continue home meds- was not given Cozaar pre op so will order now -was given Toprol last pm  Type 2 diabetes mellitus, uncontrolled  -CBG > 200 but did not receive Lantus pre op -begin SSI -check HgbA1c -add in at least 1/2 usual dose (60 units baseline) then increase based on oral intake and CBG readings  Rheumatoid arthritis- chronic immunosuppression  -on Rheumatrex pre admit but will defer to orthopedics re: resume since acutely post op  History of GI bleed/2012  -PPI for stress ulcer prevention especially with use of IV Toradol  H/O mechanical aortic valve replacement/Chronic anticoagulation (Coumadin)  -defer resume anticoagulation to orthopedics post op but recommend at least begin IV Heparin 8 hours post op or per Cards recommendations-would recommend ask Pharmacy to dose if use Heparin  Dyslipidemia   CAD, CABG 1991, redo 2006, S/P DES to Left Main 04/01/12 post cardiac arrest  -per Cards  Cardiomyopathy, ischemic, EF 35-40% 2D 03/31/12/Left ventricular diastolic dysfunction, NYHA class 2 -per Cards- appears compensated   I will followup again tomorrow. Please contact me if I can be of assistance in the meanwhile. Thank you for this consultation.  Chief Complaint: Management of medical problems  HPI:  Pt with multiple medical problems admitted for elective TKR on 01/24/2012. We have been asked to manage her non cardiac medical problems including her diabetes. She was cleared by  Cardiology pre op and they will be following this admission  Review of Systems:  Neuro: No HA'S, visual disturbances, unilateral weakness, facial drooping, paresthesias endorsed ENT: No sinus drainage, cough, ocular drainage, sore throat, ear pain or drainage, swelling in neck or dry mouth endorsed, endentulous, wears glasses Pulmonary: No SOB; stable DOE; no hemoptysis or wheezing endorsed Cardiac: No CP at rest or w/ exertion, no palpitations, no near syncope or dizziness, no dependent redness of extremities GI: No abdominal pain, melena, hematochezia, hematemesis, diarrhea, or worsened GERD sx's MS: Knee pain that has progressed requiring current surgical procedure GU: No dysuria, urinary frequency, hematuria, bladder leakage endorsed Skin: Easy bruising but on Warfarin, has persistent lesion left lateral calf that has improved with silver impregnated stocking device prescribed by Dr. Lajoyce Corners- pt also self treated with left over Keflex Heme; INR last week was ~ 7- family says pt was taking left over Keflex Psych: No insomnia, hallucinations (visual or auditory), no mood swings, no change in appetite  Past Medical History  Diagnosis Date  . High blood pressure   . Irregular heart rhythm   . High cholesterol   . Coronary artery disease   . COPD (chronic obstructive pulmonary disease)   . Anemia   . Irregular heart beat   . Myocardial infarction march 2013  . Peripheral vascular disease   . Cardiac arrest   . CHF (congestive heart failure) 01/06/12    Transthoracic Echocardiogram EF 45-50%  . Memory deficits 09/06/2012  . Polyneuropathy in diabetes(357.2)   . Degenerative arthritis   . Fibromyalgia   . Diabetes mellitus     Type II  . Heart murmur   . Angina at rest     no  new symptoms since 11/08/12 (01/17/13)   Past Surgical History  Procedure Laterality Date  . Gallbadder  2003  . Cardiac valve replacement  1991  . Heart bypass  1991/2005  . Angioplasty    . Coronary artery  bypass graft    . Aortic valve replacement    . Cholecystectomy    . Esophagogastroduodenoscopy  03/31/2011    Procedure: ESOPHAGOGASTRODUODENOSCOPY (EGD);  Surgeon: Beverley Fiedler, MD;  Location: [facility] ENDOSCOPY;  Service: Gastroenterology;  Laterality: N/A;  . Bypass graft  1991 and 2005  . Toenail avulsion Left    Social History:  reports that she quit smoking about 40 years ago. Her smoking use included Cigarettes. She has a 7.5 pack-year smoking history. She has never used smokeless tobacco. She reports that she does not drink alcohol or use illicit drugs.  Allergies  Allergen Reactions  . Shellfish Allergy Anaphylaxis    Not allergic to betadine  . Codeine Other (See Comments)    Feels sick   Family History  Problem Relation Age of Onset  . Rheum arthritis Mother   . Cervical cancer Mother   . Stomach cancer Mother   . Cancer Mother   . Hyperlipidemia Mother   . Hypertension Mother   . Rheum arthritis Father   . Rheum arthritis Paternal Aunt   . Anesthesia problems Neg Hx   . Deep vein thrombosis Brother   . Hypertension Brother   . Hypertension Daughter     Prior to Admission medications   Medication Sig Start Date End Date Taking? Authorizing Provider  atorvastatin (LIPITOR) 40 MG tablet Take 40 mg by mouth daily.   Yes Historical Provider, MD  calcium carbonate (OS-CAL) 600 MG TABS Take 600 mg by mouth 2 (two) times daily.     Yes Historical Provider, MD  clopidogrel (PLAVIX) 75 MG tablet Take 1 tablet (75 mg total) by mouth daily with breakfast. 07/24/12  Yes Mihai Croitoru, MD  Diclofenac Sodium (PENNSAID) 1.5 % SOLN Apply 1 each topically daily as needed (for arthritis).    Yes Historical Provider, MD  diclofenac sodium (VOLTAREN) 1 % GEL Apply 2 g topically daily as needed (for pain).   Yes Historical Provider, MD  fenofibrate (TRICOR) 145 MG tablet Take 145 mg by mouth daily.   Yes Historical Provider, MD  fenofibrate 160 MG tablet Take 160 mg by mouth daily.   Yes  Historical Provider, MD  folic acid (FOLVITE) 400 MCG tablet Take 400 mcg by mouth daily.   Yes Historical Provider, MD  furosemide (LASIX) 40 MG tablet Take 1 tablet (40 mg total) by mouth 2 (two) times daily. 06/14/12  Yes Wilburt Finlay, PA-C  gabapentin (NEURONTIN) 300 MG capsule Take 300 mg by mouth 3 (three) times daily.    Yes Historical Provider, MD  HYDROcodone-acetaminophen (NORCO) 7.5-325 MG per tablet Take 1 tablet by mouth at bedtime as needed for moderate pain.    Yes Historical Provider, MD  insulin glargine (LANTUS) 100 UNIT/ML injection Inject 0.6 mLs (60 Units total) into the skin daily with breakfast. 04/07/12  Yes Brittainy Simmons, PA-C  insulin lispro (HUMALOG) 100 UNIT/ML injection Inject 7 Units into the skin 3 (three) times daily with meals.   Yes Historical Provider, MD  losartan (COZAAR) 100 MG tablet Take 100 mg by mouth daily.   Yes Historical Provider, MD  methotrexate (RHEUMATREX) 2.5 MG tablet Take 15 mg by mouth every Thursday. Caution:Chemotherapy. Protect from light.   Yes Historical Provider, MD  metoprolol succinate (  TOPROL-XL) 25 MG 24 hr tablet Take 1 tablet (25 mg total) by mouth 2 (two) times daily. 07/24/12  Yes Mihai Croitoru, MD  Multiple Vitamin (MULTIVITAMIN) capsule Take 1 capsule by mouth daily.   Yes Historical Provider, MD  potassium chloride SA (K-DUR,KLOR-CON) 20 MEQ tablet Take 20 mEq by mouth every Monday, Wednesday, and Friday.   Yes Historical Provider, MD  temazepam (RESTORIL) 30 MG capsule Take 1 capsule (30 mg total) by mouth at bedtime as needed for sleep. 04/07/12  Yes Brittainy Simmons, PA-C  vitamin B-12 (CYANOCOBALAMIN) 1000 MCG tablet Take 1,000 mcg by mouth 2 (two) times daily.     Yes Historical Provider, MD  warfarin (COUMADIN) 5 MG tablet Take 5 mg by mouth daily.   Yes Historical Provider, MD  nitroGLYCERIN (NITROSTAT) 0.4 MG SL tablet Place 0.4 mg under the tongue every 5 (five) minutes as needed for chest pain.    Historical Provider, MD    Physical Exam: Blood pressure 163/46, pulse 74, temperature 97.8 F (36.6 C), temperature source Oral, resp. rate 14, SpO2 94.00%. Filed Vitals:   01/23/13 1153  BP: 163/46  Pulse: 74  Temp: 97.8 F (36.6 C)  Resp: 14     General:  In no acute distress, appears stated age  Eyes:  Conjunctiva clear, PERRL, no exudate  ENT: Oral mucous membranes slightly dry immediately post op, no nasal drianage, oropharynx clear on gross exam- unable to examinee TM's in current setting  Neck: Supple w/o appreciable adenopathy  Cardiovascular: S4 gallup due to mech valve, no JVD, RRR,nno peripheral edema, distal pulses are palpable  Respiratory: CTA bilaterally, 2L oxygen, non labored and no tachypnea  Abdomen: Soft, non tender and nondistended, BS present  Genitourinary: Foley in place with clear yellow urine to bedside bag  Skin: Warm and dry, several scattered ecchymosis, small area under left TED hose with obvious dry eschar which is tender  Musculoskeletal: Symmetrical, RLE in CPM device  Psychiatric: Alert and oriented x 3, affect and mood appropriate  Neurologic: CN 2-12 appear grossly intact, no focal neuro deficits appreciated, MOE x 3 - RLE limited by CPM  Labs on Admission:  Basic Metabolic Panel: No results found for this basename: NA, K, CL, CO2, GLUCOSE, BUN, CREATININE, CALCIUM, MG, PHOS,  in the last 168 hours Liver Function Tests: No results found for this basename: AST, ALT, ALKPHOS, BILITOT, PROT, ALBUMIN,  in the last 168 hours No results found for this basename: LIPASE, AMYLASE,  in the last 168 hours No results found for this basename: AMMONIA,  in the last 168 hours CBC: No results found for this basename: WBC, NEUTROABS, HGB, HCT, MCV, PLT,  in the last 168 hours Cardiac Enzymes: No results found for this basename: CKTOTAL, CKMB, CKMBINDEX, TROPONINI,  in the last 168 hours BNP: No components found with this basename: POCBNP,  CBG:  Recent Labs Lab  01/23/13 0556 01/23/13 0956  GLUCAP 260* 284*    Radiological Exams on Admission: No results found.  EKG: Was not obtained this admission thus far  Time spent: 30 minutes  Brysan Mcevoy L. ANP [facility] Pager [phone]  If 7PM-7AM, please contact night-coverage [email or web address] Password [email or web address] 01/23/2013, 12:01 PM

## 2013-01-23 NOTE — Anesthesia Preprocedure Evaluation (Addendum)
Anesthesia Evaluation  Patient identified by MRN, date of birth, ID band Patient awake    Reviewed: Allergy & Precautions, H&P , NPO status , Patient's Chart, lab work & pertinent test results, reviewed documented beta blocker date and time   Airway Mallampati: II TM Distance: >3 FB Neck ROM: full    Dental  (+) Edentulous Upper, Edentulous Lower and Dental Advisory Given   Pulmonary COPDformer smoker,  breath sounds clear to auscultation        Cardiovascular hypertension, Pt. on medications + angina + CAD, + Past MI, + Cardiac Stents, + CABG, + Peripheral Vascular Disease and +CHF + dysrhythmias Atrial Fibrillation and Ventricular Fibrillation + Valvular Problems/Murmurs Rhythm:regular     Neuro/Psych PSYCHIATRIC DISORDERS Depression  Neuromuscular disease    GI/Hepatic Neg liver ROS, PUD,   Endo/Other  diabetes, Insulin Dependent  Renal/GU Renal Insufficiency and ARFRenal disease  negative genitourinary   Musculoskeletal  (+) Arthritis -, Fibromyalgia -  Abdominal   Peds  Hematology negative hematology ROS (+) anemia ,   Anesthesia Other Findings See surgeon's H&P   Reproductive/Obstetrics negative OB ROS                         Anesthesia Physical Anesthesia Plan  ASA: IV  Anesthesia Plan: General   Post-op Pain Management:    Induction: Intravenous  Airway Management Planned: Oral ETT  Additional Equipment: Arterial line  Intra-op Plan:   Post-operative Plan: Extubation in OR  Informed Consent: I have reviewed the patients History and Physical, chart, labs and discussed the procedure including the risks, benefits and alternatives for the proposed anesthesia with the patient or authorized representative who has indicated his/her understanding and acceptance.   Dental Advisory Given  Plan Discussed with: CRNA and Surgeon  Anesthesia Plan Comments:         Anesthesia Quick  Evaluation

## 2013-01-23 NOTE — Consult Note (Signed)
I have seen and examined this patient and I agree with the above assessment and plan.  Adding scheduled mealtime insulin and increasing Lantus. She is eating well and sugars in the 400 this evening.   Pamella Pert, MD [facility] [phone]

## 2013-01-23 NOTE — Transfer of Care (Signed)
Immediate Anesthesia Transfer of Care Note  Patient: [name]  Procedure(s) Performed: Procedure(s): RIGHT TOTAL KNEE ARTHROPLASTY (Right)  Patient Location: PACU  Anesthesia Type:General  Level of Consciousness: awake  Airway & Oxygen Therapy: Patient Spontanous Breathing, N/C  Post-op Assessment: Report given to PACU RN  Post vital signs: stable  Complications: No apparent anesthesia complications

## 2013-01-23 NOTE — Op Note (Signed)
PATIENT ID:      [name]  MRN:     [id number] DOB/AGE:    Jul 10, 1940 / 73 y.o.       OPERATIVE REPORT    DATE OF PROCEDURE:  01/23/2013       PREOPERATIVE DIAGNOSIS:   END STAGE RIGHT KNEE OSTEOARTHRITIS                                                       Estimated body mass index is 31.94 kg/(m^2) as calculated from the following:   Height as of 01/16/13: 5\' 2"  (1.575 m).   Weight as of 11/08/12: 79.243 kg (174 lb 11.2 oz).     POSTOPERATIVE DIAGNOSIS:   END STAGE RIGHT KNEE OSTEOARTHRITIS                                                                     Estimated body mass index is 31.94 kg/(m^2) as calculated from the following:   Height as of 01/16/13: 5\' 2"  (1.575 m).   Weight as of 11/08/12: 79.243 kg (174 lb 11.2 oz).     PROCEDURE:  Procedure(s): RIGHT TOTAL KNEE ARTHROPLASTY right     SURGEON:  , MD    ASSISTANT:   11/10/12, PA-C   (Present and scrubbed throughout the case, critical for assistance with exposure, retraction, instrumentation, and closure.)          ANESTHESIA: general     DRAINS: (right knee) Hemovact drain(s) in the clamped with  Suction Clamped :      TOURNIQUET TIME:  Total Tourniquet Time Documented: Thigh (Right) - 65 minutes Total: Thigh (Right) - 65 minutes     COMPLICATIONS:  None   CONDITION:  stable  PROCEDURE IN [name]   [name] W 01/23/2013, 9:23 AM

## 2013-01-23 NOTE — Preoperative (Signed)
Beta Blockers   Reason not to administer Beta Blockers:Not Applicable 

## 2013-01-23 NOTE — Consult Note (Signed)
CARDIOLOGY CONSULT NOTE     Patient ID: [name] MRN: [id number] DOB/AGE: 08/24/1940 73 y.o.  Admit date: 01/23/2013 Referring Physician [name] MD Primary Physician Merri Brunette MD Primary Cardiologist Thurmon Fair MD Reason for Consultation Post op cardiac evaluation.  HPI: [name] is a 73 yo WF we are asked to see by ortho for management of cardiac meds post op TKR. She has a history of coronary disease and is status post redo CABG. She is status post mechanical aortic valve replacement for severe aortic stenosis in 1991. She was admitted in March of 2014 with acute coronary syndrome op day by cardiac arrest with torsade de pointes. She had a drug-eluting stent placed in the left main coronary. Ejection fraction was 40%. She was seen in October by cardiology and felt to be stable for orthopedic surgery. Today she underwent total knee replacement. There were no complications. She has no arrhythmias. She is awake and alert postoperatively and has no complaints of chest pain or shortness of breath. Prior to her surgery she was on Plavix for her prior stents and Coumadin for her mechanical aortic valve. These were held for her procedure.  Review of systems complete and found to be negative unless listed above   Past Medical History  Diagnosis Date  . High blood pressure   . Irregular heart rhythm   . High cholesterol   . Coronary artery disease   . COPD (chronic obstructive pulmonary disease)   . Anemia   . Irregular heart beat   . Myocardial infarction march 2013  . Peripheral vascular disease   . Cardiac arrest   . CHF (congestive heart failure) 01/06/12    Transthoracic Echocardiogram EF 45-50%  . Memory deficits 09/06/2012  . Polyneuropathy in diabetes(357.2)   . Degenerative arthritis   . Fibromyalgia   . Diabetes mellitus     Type II  . Heart murmur   . Angina at rest     no new symptoms since 11/08/12 (01/17/13)    Family History  Problem Relation  Age of Onset  . Rheum arthritis Mother   . Cervical cancer Mother   . Stomach cancer Mother   . Cancer Mother   . Hyperlipidemia Mother   . Hypertension Mother   . Rheum arthritis Father   . Rheum arthritis Paternal Aunt   . Anesthesia problems Neg Hx   . Deep vein thrombosis Brother   . Hypertension Brother   . Hypertension Daughter     History   Social History  . Marital Status: Married    Spouse Name: N/A    Number of Children: 3  . Years of Education: N/A   Occupational History  . retired     bed control at [employer]   Social History Main Topics  . Smoking status: Former Smoker -- 0.50 packs/day for 15 years    Types: Cigarettes    Quit date: 01/18/1973  . Smokeless tobacco: Never Used  . Alcohol Use: No  . Drug Use: No  . Sexual Activity: Not on file   Other Topics Concern  . Not on file   Social History Narrative  . No narrative on file    Past Surgical History  Procedure Laterality Date  . Gallbadder  2003  . Cardiac valve replacement  1991  . Heart bypass  1991/2005  . Angioplasty    . Coronary artery bypass graft    . Aortic valve replacement    . Cholecystectomy    .  Esophagogastroduodenoscopy  03/31/2011    Procedure: ESOPHAGOGASTRODUODENOSCOPY (EGD);  Surgeon: Beverley Fiedler, MD;  Location: [facility] ENDOSCOPY;  Service: Gastroenterology;  Laterality: N/A;  . Bypass graft  1991 and 2005  . Toenail avulsion Left      Prescriptions prior to admission  Medication Sig Dispense Refill  . atorvastatin (LIPITOR) 40 MG tablet Take 40 mg by mouth daily.      . calcium carbonate (OS-CAL) 600 MG TABS Take 600 mg by mouth 2 (two) times daily.        . clopidogrel (PLAVIX) 75 MG tablet Take 1 tablet (75 mg total) by mouth daily with breakfast.  90 tablet  3  . Diclofenac Sodium (PENNSAID) 1.5 % SOLN Apply 1 each topically daily as needed (for arthritis).       Marland Kitchen diclofenac sodium (VOLTAREN) 1 % GEL Apply 2 g topically daily as needed (for pain).      .  fenofibrate (TRICOR) 145 MG tablet Take 145 mg by mouth daily.      . fenofibrate 160 MG tablet Take 160 mg by mouth daily.      . folic acid (FOLVITE) 400 MCG tablet Take 400 mcg by mouth daily.      . furosemide (LASIX) 40 MG tablet Take 1 tablet (40 mg total) by mouth 2 (two) times daily.  60 tablet  3  . gabapentin (NEURONTIN) 300 MG capsule Take 300 mg by mouth 3 (three) times daily.       Marland Kitchen HYDROcodone-acetaminophen (NORCO) 7.5-325 MG per tablet Take 1 tablet by mouth at bedtime as needed for moderate pain.       Marland Kitchen insulin glargine (LANTUS) 100 UNIT/ML injection Inject 0.6 mLs (60 Units total) into the skin daily with breakfast.  10 mL  5  . insulin lispro (HUMALOG) 100 UNIT/ML injection Inject 7 Units into the skin 3 (three) times daily with meals.      Marland Kitchen losartan (COZAAR) 100 MG tablet Take 100 mg by mouth daily.      . methotrexate (RHEUMATREX) 2.5 MG tablet Take 15 mg by mouth every Thursday. Caution:Chemotherapy. Protect from light.      . metoprolol succinate (TOPROL-XL) 25 MG 24 hr tablet Take 1 tablet (25 mg total) by mouth 2 (two) times daily.  180 tablet  3  . Multiple Vitamin (MULTIVITAMIN) capsule Take 1 capsule by mouth daily.      . potassium chloride SA (K-DUR,KLOR-CON) 20 MEQ tablet Take 20 mEq by mouth every Monday, Wednesday, and Friday.      . temazepam (RESTORIL) 30 MG capsule Take 1 capsule (30 mg total) by mouth at bedtime as needed for sleep.  30 capsule  5  . vitamin B-12 (CYANOCOBALAMIN) 1000 MCG tablet Take 1,000 mcg by mouth 2 (two) times daily.        Marland Kitchen warfarin (COUMADIN) 5 MG tablet Take 5 mg by mouth daily.      . nitroGLYCERIN (NITROSTAT) 0.4 MG SL tablet Place 0.4 mg under the tongue every 5 (five) minutes as needed for chest pain.        Physical Exam: Blood pressure 163/46, pulse 74, temperature 97.8 F (36.6 C), temperature source Oral, resp. rate 14, SpO2 94.00%.  He is a pleasant white female in no acute distress. HEENT: Normocephalic, atraumatic.  Pupils equal round reactive light accommodation. Sclera clear. Oropharynx is clear. Neck: No JVD or bruits. Carotid upstrokes are normal. No adenopathy or thyromegaly. Lungs: Clear Cardiovascular: Regular rate and rhythm. Normal S1 with a  mechanical aortic valve closure/click. No murmur or rub. Abdomen: Soft and nontender. No masses or hepatosplenomegaly. Bowel sounds are positive. Extremities: Femoral and pedal pulses are 2+. No edema. Skin: Warm and dry Neurological alert and oriented x3. Cranial nerves II through XII are intact. Labs:   Lab Results  Component Value Date   WBC 9.7 01/16/2013   HGB 12.9 01/16/2013   HCT 39.2 01/16/2013   MCV 98.7 01/16/2013   PLT 409* 01/16/2013   No results found for this basename: NA, K, CL, CO2, BUN, CREATININE, CALCIUM, LABALBU, PROT, BILITOT, ALKPHOS, ALT, AST, GLUCOSE,  in the last 168 hours Lab Results  Component Value Date   CKTOTAL 116 03/30/2011   CKMB 2.3 03/30/2011   TROPONINI 2.73* 04/01/2012    Lab Results  Component Value Date   CHOL  Value: 131        ATP III CLASSIFICATION:  <200     mg/dL   Desirable  409-811200-239  mg/dL   Borderline High  >=914>=240    mg/dL   High        78/29/562111/30/2011   CHOL  Value: 132        ATP III CLASSIFICATION:  <200     mg/dL   Desirable  308-657200-239  mg/dL   Borderline High  >=846>=240    mg/dL   High        96/29/528412/14/2010   CHOL  Value: 300        ATP III CLASSIFICATION:  <200     mg/dL   Desirable  132-440200-239  mg/dL   Borderline High  >=102>=240    mg/dL   High       * 7/25/36648/18/2010   Lab Results  Component Value Date   HDL 40 12/17/2009   HDL 44 12/31/2008   HDL 43 09/04/2008   Lab Results  Component Value Date   LDLCALC  Value: 64        Total Cholesterol/HDL:CHD Risk Coronary Heart Disease Risk Table                     Men   Women  1/2 Average Risk   3.4   3.3  Average Risk       5.0   4.4  2 X Average Risk   9.6   7.1  3 X Average Risk  23.4   11.0        Use the calculated Patient Ratio above and the CHD Risk Table to determine the  patient's CHD Risk.        ATP III CLASSIFICATION (LDL):  <100     mg/dL   Optimal  403-474100-129  mg/dL   Near or Above                    Optimal  130-159  mg/dL   Borderline  259-563160-189  mg/dL   High  >875>190     mg/dL   Very High 64/33/295111/30/2011   LDLCALC  Value: 69        Total Cholesterol/HDL:CHD Risk Coronary Heart Disease Risk Table                     Men   Women  1/2 Average Risk   3.4   3.3  Average Risk       5.0   4.4  2 X Average Risk   9.6   7.1  3 X Average Risk  23.4   11.0  Use the calculated Patient Ratio above and the CHD Risk Table to determine the patient's CHD Risk.        ATP III CLASSIFICATION (LDL):  <100     mg/dL   Optimal  622-633  mg/dL   Near or Above                    Optimal  130-159  mg/dL   Borderline  354-562  mg/dL   High  >563     mg/dL   Very High 89/37/3428   LDLCALC  Value: 200        Total Cholesterol/HDL:CHD Risk Coronary Heart Disease Risk Table                     Men   Women  1/2 Average Risk   3.4   3.3  Average Risk       5.0   4.4  2 X Average Risk   9.6   7.1  3 X Average Risk  23.4   11.0        Use the calculated Patient Ratio above and the CHD Risk Table to determine the patient's CHD Risk.        ATP III CLASSIFICATION (LDL):  <100     mg/dL   Optimal  768-115  mg/dL   Near or Above                    Optimal  130-159  mg/dL   Borderline  726-203  mg/dL   High  >559     mg/dL   Very High* 7/41/6384   Lab Results  Component Value Date   TRIG 136 12/17/2009   TRIG 94 12/31/2008   TRIG 287* 09/04/2008   Lab Results  Component Value Date   CHOLHDL 3.3 12/17/2009   CHOLHDL 3.0 12/31/2008   CHOLHDL 7.0 09/04/2008   No results found for this basename: LDLDIRECT      Radiology: CLINICAL DATA: Preop chest x-ray  EXAM:  CHEST 2 VIEW  COMPARISON: 04/03/2012  FINDINGS:  Bilateral mild interstitial thickening likely chronic. There is no  focal parenchymal opacity, pleural effusion, or pneumothorax. Mild  stable cardiomegaly. Prior CABG.  The osseous structures  are unremarkable.  IMPRESSION:  No active cardiopulmonary disease.  Electronically Signed  By: Elige Ko  On: 01/16/2013 14:12    ASSESSMENT AND PLAN:  1. Status post total knee replacement. 2. Coronary disease status post redo CABG. Status post stenting of the left main coronary in March 2014 with a drug-eluting stent. Patient is asymptomatic. She is hemodynamically stable. Recommend resumption of Plavix when okay with surgery. 3. Status post mechanical aortic valve replacement. Recommend resumption of Coumadin with target INR of 2.5. 4. Hypertension. Recommend resuming her home medications. 5. Diabetes mellitus type 2 6. Hypercholesterolemia.  SignedTheron Arista [facility] 01/23/2013, 12:02 PM

## 2013-01-23 NOTE — H&P (Signed)
  The recent History & Physical has been reviewed. I have personally examined the patient today. There is no interval change to the documented History & Physical. The patient would like to proceed with the procedure.  [name] W 01/23/2013,  7:13 AM

## 2013-01-23 NOTE — Progress Notes (Signed)
CBG 402. MD called and will add patient back to scheduled insulin as she was taking prior to admission. Patient will receive 15 units with additional 5 units per MD order.

## 2013-01-23 NOTE — Evaluation (Signed)
Physical Therapy Evaluation Patient Details Name: [name] MRN: [id number] DOB: [birth year] Today's Date: 01/23/2013 Time: 0962-8366 PT Time Calculation (min): 20 min  PT Assessment / Plan / Recommendation History of Present Illness  Pt is a 73 y/o female admitted s/p R TKA.  Clinical Impression  This patient presents with acute pain and decreased functional independence following the above mentioned procedure. At the time of PT eval, pt was able to perform functional mobility with occasional min assist. This patient is appropriate for skilled PT interventions to address functional limitations, improve safety and independence with functional mobility, and return to PLOF.     PT Assessment  Patient needs continued PT services    Follow Up Recommendations  SNF    Does the patient have the potential to tolerate intense rehabilitation      Barriers to Discharge        Equipment Recommendations  Other (comment) (TBD by next venue of care)    Recommendations for Other Services     Frequency 7X/week    Precautions / Restrictions Precautions Precautions: Fall;Knee Precaution Comments: Discussed towel roll under heel and NO pillow under knee Restrictions Weight Bearing Restrictions: Yes RLE Weight Bearing: Partial weight bearing RLE Partial Weight Bearing Percentage or Pounds: 50%   Pertinent Vitals/Pain Pt reports 4/10 pain at rest prior to beginning of session.       Mobility  Bed Mobility Bed Mobility: Supine to Sit;Sitting - Scoot to Edge of Bed Supine to Sit: 4: Min assist;HOB elevated;With rails Sitting - Scoot to Edge of Bed: 4: Min guard Details for Bed Mobility Assistance: Assist for support and movement to EOB, and as pt scooted to rest feet on floor.  Transfers Transfers: Sit to Stand;Stand to Sit Sit to Stand: 4: Min guard;From bed;With upper extremity assist Stand to Sit: 4: Min guard;To chair/3-in-1;With upper extremity assist Details for Transfer  Assistance: VC's for hand placement on seated surface for safety.  Ambulation/Gait Ambulation/Gait Assistance: 4: Min guard Ambulation Distance (Feet): 10 Feet Assistive device: Rolling walker Ambulation/Gait Assistance Details: VC's for PWB status on R, as well as for sequencing and safety awareness. Gait Pattern: Step-to pattern;Decreased stride length Gait velocity: Decreased Stairs: No Wheelchair Mobility Wheelchair Mobility: No    Exercises Total Joint Exercises Ankle Circles/Pumps: 10 reps Quad Sets: 10 reps   PT Diagnosis: Difficulty walking;Acute pain  PT Problem List: Decreased strength;Decreased range of motion;Decreased activity tolerance;Decreased balance;Decreased mobility;Decreased safety awareness;Decreased knowledge of use of DME;Decreased knowledge of precautions;Pain PT Treatment Interventions: Gait training;DME instruction;Stair training;Functional mobility training;Therapeutic activities;Therapeutic exercise;Neuromuscular re-education;Patient/family education     PT Goals(Current goals can be found in the care plan section) Acute Rehab PT Goals Patient Stated Goal: To return home with husband after rehab PT Goal Formulation: With patient Time For Goal Achievement: 02/06/13 Potential to Achieve Goals: Good  Visit Information  Last PT Received On: 01/23/13 Assistance Needed: +1 History of Present Illness: Pt is a 73 y/o female admitted s/p R TKA.       Prior Functioning  Home Living Family/patient expects to be discharged to:: Skilled nursing facility [facility]) Living Arrangements: Spouse/significant other Prior Function Level of Independence: Independent Communication Communication: No difficulties Dominant Hand: Left    Cognition  Cognition Arousal/Alertness: Lethargic;Suspect due to medications Behavior During Therapy: [facility] for tasks assessed/performed Overall Cognitive Status: Within Functional Limits for tasks assessed     Extremity/Trunk Assessment Upper Extremity Assessment Upper Extremity Assessment: Defer to OT evaluation Lower Extremity Assessment Lower Extremity Assessment:  RLE deficits/detail RLE Deficits / Details: Decreased strength and AROM consistent with TKA RLE: Unable to fully assess due to pain Cervical / Trunk Assessment Cervical / Trunk Assessment: Kyphotic   Balance    End of Session PT - End of Session Equipment Utilized During Treatment: Gait belt Activity Tolerance: Patient tolerated treatment well Patient left: in chair;with call bell/phone within reach Nurse Communication: Mobility status CPM Right Knee CPM Right Knee: Off Right Knee Flexion (Degrees): 60 Right Knee Extension (Degrees): 0 Additional Comments: completed 3.5hours  GP     Ruthann Cancer 01/23/2013, 2:55 PM  Ruthann Cancer, PT, DPT [phone]

## 2013-01-23 NOTE — Progress Notes (Signed)
ANTICOAGULATION CONSULT NOTE - Initial Consult  Pharmacy Consult for coumadin Indication: mechanical AVR  Allergies  Allergen Reactions  . Shellfish Allergy Anaphylaxis    Not allergic to betadine  . Codeine Other (See Comments)    Feels sick    Vital Signs: Temp: 97.9 F (36.6 C) (01/06 1338) Temp src: Oral (01/06 1338) BP: 169/55 mmHg (01/06 1338) Pulse Rate: 74 (01/06 1338)  Labs:  Recent Labs  01/23/13 0555  APTT 26  LABPROT 13.9  INR 1.09    The CrCl is unknown because both a height and weight (above a minimum accepted value) are required for this calculation.   Medical History: Past Medical History  Diagnosis Date  . High blood pressure   . Irregular heart rhythm   . High cholesterol   . Coronary artery disease   . COPD (chronic obstructive pulmonary disease)   . Anemia   . Irregular heart beat   . Myocardial infarction march 2013  . Peripheral vascular disease   . Cardiac arrest   . CHF (congestive heart failure) 01/06/12    Transthoracic Echocardiogram EF 45-50%  . Memory deficits 09/06/2012  . Polyneuropathy in diabetes(357.2)   . Degenerative arthritis   . Fibromyalgia   . Diabetes mellitus     Type II  . Heart murmur   . Angina at rest     no new symptoms since 11/08/12 (01/17/13)   Assessment: Patient is a 73 y.o F on coumadin PTA for mechanical AVR.  Anticoag-clinic's note on 1/2 indicated that her INR goal is 2.5-3.5. She's now s/p right TKA.  INR today is 1.09.     Goal of Therapy:  INR 2.5-3.5    Plan:  1) coumadin 4mg  PO x1 today  [name] P 01/23/2013,2:01 PM

## 2013-01-23 NOTE — Plan of Care (Signed)
Problem: Consults Goal: Diagnosis- Total Joint Replacement Primary Total Knee Right     

## 2013-01-23 NOTE — Anesthesia Postprocedure Evaluation (Signed)
Anesthesia Post Note  Patient: [name]  Procedure(s) Performed: Procedure(s) (LRB): RIGHT TOTAL KNEE ARTHROPLASTY (Right)  Anesthesia type: General  Patient location: PACU  Post pain: Pain level controlled  Post assessment: Patient's Cardiovascular Status Stable  Last Vitals:  Filed Vitals:   01/23/13 1039  BP: 156/52  Pulse: 71  Temp:   Resp: 11    Post vital signs: Reviewed and stable  Level of consciousness: alert  Complications: No apparent anesthesia complications

## 2013-01-24 ENCOUNTER — Encounter (HOSPITAL_COMMUNITY): Payer: Self-pay | Admitting: Physician Assistant

## 2013-01-24 DIAGNOSIS — I469 Cardiac arrest, cause unspecified: Secondary | ICD-10-CM

## 2013-01-24 DIAGNOSIS — I5043 Acute on chronic combined systolic (congestive) and diastolic (congestive) heart failure: Secondary | ICD-10-CM

## 2013-01-24 DIAGNOSIS — N179 Acute kidney failure, unspecified: Secondary | ICD-10-CM

## 2013-01-24 LAB — CBC
HEMATOCRIT: 27.7 % — AB (ref 36.0–46.0)
HEMOGLOBIN: 8.9 g/dL — AB (ref 12.0–15.0)
MCH: 32 pg (ref 26.0–34.0)
MCHC: 32.1 g/dL (ref 30.0–36.0)
MCV: 99.6 fL (ref 78.0–100.0)
Platelets: 242 10*3/uL (ref 150–400)
RBC: 2.78 MIL/uL — ABNORMAL LOW (ref 3.87–5.11)
RDW: 14.4 % (ref 11.5–15.5)
WBC: 12.6 10*3/uL — ABNORMAL HIGH (ref 4.0–10.5)

## 2013-01-24 LAB — GLUCOSE, CAPILLARY
GLUCOSE-CAPILLARY: 259 mg/dL — AB (ref 70–99)
Glucose-Capillary: 155 mg/dL — ABNORMAL HIGH (ref 70–99)
Glucose-Capillary: 164 mg/dL — ABNORMAL HIGH (ref 70–99)
Glucose-Capillary: 192 mg/dL — ABNORMAL HIGH (ref 70–99)

## 2013-01-24 LAB — BASIC METABOLIC PANEL
BUN: 22 mg/dL (ref 6–23)
CO2: 28 meq/L (ref 19–32)
Calcium: 8.4 mg/dL (ref 8.4–10.5)
Chloride: 95 mEq/L — ABNORMAL LOW (ref 96–112)
Creatinine, Ser: 1.24 mg/dL — ABNORMAL HIGH (ref 0.50–1.10)
GFR calc Af Amer: 49 mL/min — ABNORMAL LOW (ref >90)
GFR calc non Af Amer: 42 mL/min — ABNORMAL LOW (ref >90)
GLUCOSE: 184 mg/dL — AB (ref 70–99)
POTASSIUM: 4.5 meq/L (ref 3.7–5.3)
Sodium: 135 mEq/L — ABNORMAL LOW (ref 137–147)

## 2013-01-24 LAB — PROTIME-INR
INR: 1.19 (ref 0.00–1.49)
Prothrombin Time: 14.8 seconds (ref 11.6–15.2)

## 2013-01-24 MED ORDER — MULTIVITAMINS PO CAPS: 1.0000 | ORAL_CAPSULE | Freq: Every day | ORAL | Status: DC

## 2013-01-24 MED ORDER — INSULIN GLARGINE 100 UNIT/ML ~~LOC~~ SOLN
20.0000 [IU] | Freq: Once | SUBCUTANEOUS | Status: AC
  Administered 2013-01-24: 20 [IU] via SUBCUTANEOUS
  Filled 2013-01-24: qty 0.2

## 2013-01-24 MED ORDER — CLOPIDOGREL BISULFATE 75 MG PO TABS
75.0000 mg | ORAL_TABLET | Freq: Every day | ORAL | Status: DC
  Filled 2013-01-24 (×2): qty 1

## 2013-01-24 MED ORDER — ATORVASTATIN CALCIUM 40 MG PO TABS
40.0000 mg | ORAL_TABLET | Freq: Every day | ORAL | Status: DC
  Administered 2013-01-24 – 2013-01-26 (×3): 40 mg via ORAL
  Filled 2013-01-24 (×4): qty 1

## 2013-01-24 MED ORDER — ADULT MULTIVITAMIN W/MINERALS CH
1.0000 | ORAL_TABLET | Freq: Every day | ORAL | Status: DC
  Administered 2013-01-24 – 2013-01-27 (×4): 1 via ORAL
  Filled 2013-01-24 (×4): qty 1

## 2013-01-24 MED ORDER — INSULIN GLARGINE 100 UNIT/ML ~~LOC~~ SOLN
60.0000 [IU] | Freq: Every day | SUBCUTANEOUS | Status: DC
  Administered 2013-01-25 – 2013-01-27 (×3): 60 [IU] via SUBCUTANEOUS
  Filled 2013-01-24 (×3): qty 0.6

## 2013-01-24 MED ORDER — FENOFIBRATE 160 MG PO TABS
160.0000 mg | ORAL_TABLET | Freq: Every day | ORAL | Status: DC
  Administered 2013-01-27: 160 mg via ORAL
  Filled 2013-01-24 (×4): qty 1

## 2013-01-24 MED ORDER — CALCIUM CARBONATE 1250 (500 CA) MG PO TABS
1.0000 | ORAL_TABLET | Freq: Two times a day (BID) | ORAL | Status: DC
  Administered 2013-01-24 – 2013-01-27 (×7): 500 mg via ORAL
  Filled 2013-01-24 (×9): qty 1

## 2013-01-24 MED ORDER — WARFARIN SODIUM 4 MG PO TABS
4.0000 mg | ORAL_TABLET | Freq: Once | ORAL | Status: AC
  Administered 2013-01-24: 4 mg via ORAL
  Filled 2013-01-24: qty 1

## 2013-01-24 MED ORDER — INSULIN ASPART 100 UNIT/ML ~~LOC~~ SOLN
7.0000 [IU] | Freq: Three times a day (TID) | SUBCUTANEOUS | Status: DC
  Administered 2013-01-24 – 2013-01-26 (×3): 7 [IU] via SUBCUTANEOUS

## 2013-01-24 MED ORDER — INSULIN ASPART 100 UNIT/ML ~~LOC~~ SOLN
0.0000 [IU] | Freq: Every day | SUBCUTANEOUS | Status: DC
  Administered 2013-01-25: 5 [IU] via SUBCUTANEOUS

## 2013-01-24 MED ORDER — INSULIN ASPART 100 UNIT/ML ~~LOC~~ SOLN
0.0000 [IU] | Freq: Three times a day (TID) | SUBCUTANEOUS | Status: DC
  Administered 2013-01-24: 5 [IU] via SUBCUTANEOUS
  Administered 2013-01-24: 2 [IU] via SUBCUTANEOUS
  Administered 2013-01-25: 1 [IU] via SUBCUTANEOUS
  Administered 2013-01-25: 5 [IU] via SUBCUTANEOUS
  Administered 2013-01-25 – 2013-01-26 (×2): 2 [IU] via SUBCUTANEOUS
  Administered 2013-01-26: 10 [IU] via SUBCUTANEOUS
  Administered 2013-01-26: 7 [IU] via SUBCUTANEOUS

## 2013-01-24 MED ORDER — CALCIUM CARBONATE 600 MG PO TABS: 600.0000 mg | ORAL_TABLET | Freq: Two times a day (BID) | ORAL | Status: DC

## 2013-01-24 MED ORDER — CLOPIDOGREL BISULFATE 75 MG PO TABS
75.0000 mg | ORAL_TABLET | Freq: Every day | ORAL | Status: DC
  Administered 2013-01-25 – 2013-01-27 (×3): 75 mg via ORAL
  Filled 2013-01-24 (×5): qty 1

## 2013-01-24 MED ORDER — PANTOPRAZOLE SODIUM 40 MG PO TBEC
40.0000 mg | DELAYED_RELEASE_TABLET | Freq: Every day | ORAL | Status: DC
  Administered 2013-01-25 – 2013-01-27 (×3): 40 mg via ORAL
  Filled 2013-01-24 (×3): qty 1

## 2013-01-24 NOTE — Progress Notes (Signed)
Patient: [name] / Admit Date: 01/23/2013 / Date of Encounter: 01/24/2013, 10:55 AM   Subjective  No CP or SOB. Recovery going easier than she thought so far. Some R knee pain. Able to lay flat without orthopnea.  Objective   Telemetry: NSR.TWI present but reviewed prior ECGs which also show TWIs.  Physical Exam: Blood pressure 137/55, pulse 73, temperature 98.8 F (37.1 C), temperature source Oral, resp. rate 18, height 5\' 2"  (1.575 m), weight 175 lb (79.379 kg), SpO2 99.00%. General: Well developed, well nourished WF in no acute distress. Head: Normocephalic, atraumatic, sclera non-icteric, no xanthomas, nares are without discharge. Neck: JVP not elevated. Lungs: Clear bilaterally to auscultation without wheezes, rales, or rhonchi. Breathing is unlabored. Heart: RRR S1 S2 without murmurs, rubs, or gallops.  Abdomen: Soft, non-tender, non-distended with normoactive bowel sounds. No rebound/guarding. Extremities: No clubbing or cyanosis. No edema. Distal pedal pulses are 2+ and equal bilaterally. Neuro: Alert and oriented X 3. Moves all extremities spontaneously. Psych:  Responds to questions appropriately with a normal affect.   Intake/Output Summary (Last 24 hours) at 01/24/13 1055 Last data filed at 01/24/13 0913  Gross per 24 hour  Intake   1960 ml  Output   1040 ml  Net    920 ml    Inpatient Medications:  . docusate sodium  100 mg Oral BID  . enoxaparin (LOVENOX) injection  40 mg Subcutaneous Q24H  . fenofibrate  160 mg Oral Daily  . folic acid  0.5 mg Oral Daily  . furosemide  40 mg Oral BID  . gabapentin  300 mg Oral TID  . insulin aspart  0-15 Units Subcutaneous TID WC  . insulin aspart  0-5 Units Subcutaneous QHS  . insulin aspart  5 Units Subcutaneous TID WC  . insulin glargine  40 Units Subcutaneous Daily  . losartan  100 mg Oral Daily  . metoprolol succinate  25 mg Oral BID  . pantoprazole (PROTONIX) IV  40 mg Intravenous Q24H  . potassium chloride SA   20 mEq Oral Q M,W,F  . warfarin  4 mg Oral ONCE-1800  . Warfarin - Pharmacist Dosing Inpatient   Does not apply q1800   Infusions:    Labs:  Recent Labs  01/23/13 1310 01/24/13 0313  NA  --  135*  K  --  4.5  CL  --  95*  CO2  --  28  GLUCOSE  --  184*  BUN  --  22  CREATININE 1.02 1.24*  CALCIUM  --  8.4   No results found for this basename: AST, ALT, ALKPHOS, BILITOT, PROT, ALBUMIN,  in the last 72 hours  Recent Labs  01/23/13 1310 01/24/13 0313  WBC 10.6* 12.6*  HGB 10.3* 8.9*  HCT 32.7* 27.7*  MCV 100.9* 99.6  PLT 302 242    Recent Labs  01/23/13 1310  HGBA1C 8.9*     Radiology/Studies:  Chest 2 View 01/16/2013   CLINICAL DATA:  Preop chest x-ray  EXAM: CHEST  2 VIEW  COMPARISON:  04/03/2012  FINDINGS: Bilateral mild interstitial thickening likely chronic. There is no focal parenchymal opacity, pleural effusion, or pneumothorax. Mild stable cardiomegaly. Prior CABG.  The osseous structures are unremarkable.  IMPRESSION: No active cardiopulmonary disease.   Electronically Signed   By: 04/05/2012   On: 01/16/2013 14:12     Assessment and Plan  1. OA s/p R TKA - stable. 2. Coronary disease s/p CABG 1991, redo 2006, s/p DES to LM  04/01/12. Patient is asymptomatic. She is hemodynamically stable. Recommend resumption of Plavix when okay with surgery.  3. S/p [employer]. [employer] Mechnical AVR - Coumadin resumed. Will discuss cross coverage with MD. ? If goal INR should be 2-3 rather than 2.5-3.5. 4. Hypertension - continue home meds. 5. Diabetes mellitus type 2, uncontrolled A1C 8.9 - per primary team. 6. Hypercholesterolemia - continue statin. 7. ICM EF 35-40% by echo 03/2012, hx cardiac arrest at that time with torsades - stable, appears euvolemic. Continue home meds. 8. ABL anemia - likely related to surgery. Follow. 9. Acute renal insufficiency  - consider holding PM Lasix dose and following.  Signed, Ronie Spies PA-C As above; no chest pain or dyspnea; coumadin resumed  for AVR; begin plavix as soon as possible given DES 3/14; will await word from surgery Olga Millers

## 2013-01-24 NOTE — Progress Notes (Signed)
PT Progress Note:     Pt progressing well with mobility.  Very motivated.  Cont with current POC.     01/24/13 0900  PT Visit Information  Last PT Received On 01/24/13  Assistance Needed +1  History of Present Illness Pt is a 73 y/o female admitted s/p R TKA.  PT Time Calculation  PT Start Time 0909  PT Stop Time 0927  PT Time Calculation (min) 18 min  Precautions  Precautions Fall;Knee  Precaution Comments Discussed towel roll under heel and NO pillow under knee  Restrictions  RLE Weight Bearing PWB  RLE Partial Weight Bearing Percentage or Pounds 50%  Cognition  Arousal/Alertness Awake/alert  Behavior During Therapy WFL for tasks assessed/performed  Overall Cognitive Status Within Functional Limits for tasks assessed  Transfers  Overall transfer level Needs assistance  Equipment used Rolling walker (2 wheeled)  Transfers Sit to/from Stand  Sit to Stand Min guard  General transfer comment cues for hand placement.    Ambulation/Gait  Ambulation/Gait assistance Min guard  Ambulation Distance (Feet) 50 Feet  Assistive device Rolling walker (2 wheeled)  Gait Pattern/deviations Step-to pattern  Gait velocity Decreased  General Gait Details cues for sequencing, 50% PWBing RLE, tall posture, & body positioning inside RW.  Increased cueing for sequencing as she fatigued.    Exercises  Exercises Total Joint  Total Joint Exercises  Ankle Circles/Pumps AROM;Both;10 reps  Quad Sets AROM;Strengthening;Both;10 reps  Heel Slides AAROM;Strengthening;Right;10 reps  Hip ABduction/ADduction AROM;Right;10 reps  Straight Leg Raises AROM;Strengthening;Right;10 reps  Long Arc Quad AROM;Strengthening;Right;10 reps  PT - End of Session  Activity Tolerance Patient tolerated treatment well  Patient left in chair;with call bell/phone within reach  Nurse Communication Mobility status  PT - Assessment/Plan  PT Plan Current plan remains appropriate  PT Frequency 7X/week  Follow Up  Recommendations SNF  PT Goal Progression  Progress towards PT goals Progressing toward goals  Acute Rehab PT Goals  PT Goal Formulation With patient  Time For Goal Achievement 02/06/13  Potential to Achieve Goals Good  PT General Charges  $$ ACUTE PT VISIT 1 Procedure  PT Treatments  $Gait Training 8-22 mins    Verdell Face, [place] [phone] 01/24/2013

## 2013-01-24 NOTE — Progress Notes (Signed)
Patient ID: KIMARIE COOR, female   DOB: 02/16/1940, 73 y.o.   MRN: [id number] PATIENT ID: QUIN MCPHERSON        MRN:  [id number]          DOB/AGE: 1940-08-23 / 73 y.o.    [name], MD   [name], PA-C [address] [place], [place]  [address]                             [phone]   PROGRESS NOTE  Subjective:  negative for Chest Pain  negative for Shortness of Breath  negative for Nausea/Vomiting   negative for Calf Pain    Tolerating Diet: yes         Patient reports pain as mild.     Comfortable this am-has some post right rib pain without SOB or nausea-will treat symptomatically and monitor  Objective: Vital signs in last 24 hours:   Patient Vitals for the past 24 hrs:  BP Temp Temp src Pulse Resp SpO2 Height Weight  01/24/13 0550 137/55 mmHg 98.8 F (37.1 C) - 73 18 99 % - -  01/24/13 0200 122/49 mmHg 98.2 F (36.8 C) - 70 18 94 % - -  01/24/13 0000 - - - - 18 - 5\' 2"  (1.575 m) 79.379 kg (175 lb)  01/23/13 2156 115/52 mmHg - - - - - - -  01/23/13 2003 111/45 mmHg 97.8 F (36.6 C) - 66 16 90 % - -  01/23/13 2000 - - - - 18 - - -  01/23/13 1338 169/55 mmHg 97.9 F (36.6 C) Oral 74 18 95 % - -  01/23/13 1153 163/46 mmHg 97.8 F (36.6 C) - 74 14 94 % - -  01/23/13 1130 - 97.1 F (36.2 C) - - - - - -  01/23/13 1125 165/55 mmHg - - 71 10 97 % - -  01/23/13 1110 173/53 mmHg - - 71 11 96 % - -  01/23/13 1055 164/56 mmHg - - 72 20 97 % - -  01/23/13 1050 - 98 F (36.7 C) - - - - - -  01/23/13 1039 156/52 mmHg - - 71 11 97 % - -  01/23/13 1025 164/71 mmHg - - 74 12 97 % - -  01/23/13 1010 164/44 mmHg - - 74 12 98 % - -  01/23/13 0955 - 99 F (37.2 C) - - - - - -  01/23/13 0954 158/57 mmHg - - 80 12 96 % - -      Intake/Output from previous day:   01/06 0701 - 01/07 0700 In: 2580 [P.O.:1240; I.V.:1290] Out: 1350 [Urine:1285; Drains:15]   Intake/Output this shift:       Intake/Output     01/06 0701 - 01/07 0700 01/07 0701 - 01/08 0700   P.O.  1240    I.V. (mL/kg) 1290 (16.3)    IV Piggyback 50    Total Intake(mL/kg) 2580 (32.5)    Urine (mL/kg/hr) 1285 (0.7)    Drains 15 (0)    Blood 50 (0)    Total Output 1350     Net +1230             LABORATORY DATA:  Recent Labs  01/23/13 1310 01/24/13 0313  WBC 10.6* 12.6*  HGB 10.3* 8.9*  HCT 32.7* 27.7*  PLT 302 242    Recent Labs  01/23/13 1310 01/24/13 0313  NA  --  135*  K  --  4.5  CL  --  95*  CO2  --  28  BUN  --  22  CREATININE 1.02 1.24*  GLUCOSE  --  184*  CALCIUM  --  8.4   Lab Results  Component Value Date   INR 1.19 01/24/2013   INR 1.09 01/23/2013   INR 4.4 01/19/2013    Recent Radiographic Studies :  Chest 2 View  01/16/2013   CLINICAL DATA:  Preop chest x-ray  EXAM: CHEST  2 VIEW  COMPARISON:  04/03/2012  FINDINGS: Bilateral mild interstitial thickening likely chronic. There is no focal parenchymal opacity, pleural effusion, or pneumothorax. Mild stable cardiomegaly. Prior CABG.  The osseous structures are unremarkable.  IMPRESSION: No active cardiopulmonary disease.   Electronically Signed   By: Elige Ko   On: 01/16/2013 14:12     Examination:  General appearance: alert, cooperative and no distress  Wound Exam: clean, dry, intact   Drainage:  Scant/small amount in hemovac-D/C'd  Motor Exam: EHL, FHL, Anterior Tibial and Posterior Tibial Intact  Sensory Exam: sensation without change   Vascular Exam: pulses decreased but no change from pre op  Assessment:    1 Day Post-Op  Procedure(s) (LRB): RIGHT TOTAL KNEE ARTHROPLASTY (Right)  ADDITIONAL DIAGNOSIS:  Active Problems:   History of GI bleed/2012   CAD, CABG 1991, redo 2006, S/P DES to Left Main 04/01/12   H/O mechanical aortic valve replacement, St Jude 1991   Chronic anticoagulation with Coumadin   Dyslipidemia   HTN (hypertension)   Cardiomyopathy, ischemic, EF 35-40% 2D 03/31/12   Type 2 diabetes mellitus, uncontrolled   Cardiac arrest 04/01/12   Rheumatoid arthritis-  chronic steroids   S/P total knee replacement using cement   Left ventricular diastolic dysfunction, NYHA class 2  Acute Blood Loss Anemia-monitor   Plan: Physical Therapy as ordered Partial Weight Bearing @ 50% (PWB)  DVT Prophylaxis:  Lovenox and Coumadin  DISCHARGE PLAN: Skilled Nursing Facility/Rehab-[facility]  DISCHARGE NEEDS: HHPT, CPM, Walker and 3-in-1 comode seat    OOB with PT, foley out, hemovac out-followed by Cards and hospitalist services-stable     Valeria Batman 01/24/2013 7:35 AM

## 2013-01-24 NOTE — Progress Notes (Signed)
Orthopedic Tech Progress Note Patient Details:  [name] [birth year] [id number] Checked to see if patient wanted to go in CPM at 1500---patient already in machine Patient ID: [name], female   DOB: 07/26/1940, 73 y.o.   MRN: [id number]   Orie Rout 01/24/2013, 3:03 PM

## 2013-01-24 NOTE — Care Management Note (Signed)
CARE MANAGEMENT NOTE 01/24/2013  Patient:  [name], [name]   Account Number:  [id number]  Date Initiated:  01/24/2013  Documentation initiated by:  Vance Peper  Subjective/Objective Assessment:   73 yr old female s/p right total knee arthroplasty.     Action/Plan:   Patient preoperatively setup with [facility]. Will need shortterm rehab, plans to go to [facility]. Social Worker is aware.   Anticipated DC Date:  01/26/2013   Anticipated DC Plan:  SKILLED NURSING FACILITY  In-house referral  Clinical Social Worker      DC Planning Services  CM consult      Choice offered to / List presented to:             Status of service:  Completed, signed off Medicare Important Message given?   (If response is "NO", the following Medicare IM given date fields will be blank) Date Medicare IM given:   Date Additional Medicare IM given:    Discharge Disposition:  SKILLED NURSING FACILITY  Per UR Regulation:    If discussed at Long Length of Stay Meetings, dates discussed:    Comments:

## 2013-01-24 NOTE — Progress Notes (Signed)
Inpatient Diabetes Program Recommendations  AACE/ADA: New Consensus Statement on Inpatient Glycemic Control (2013)  Target Ranges:  Prepandial:   less than 140 mg/dL      Peak postprandial:   less than 180 mg/dL (1-2 hours)      Critically ill patients:  140 - 180 mg/dL   Reason for Visit:Results for [name], [name] (MRN [id number]) as of 01/24/2013 15:25  Ref. Range 01/23/2013 13:10  Hemoglobin A1C Latest Range: <5.7 % 8.9 (H)   Briefly spoke to patient regarding diabetes management and elevated A1C. She states that CBG's have been more difficult to control since she was in the hospital in March of 2014.  She see's Virgina Evener at [facility] for her diabetes.  Patient is now on her home diabetes regimen.  Encouraged close follow-up with PCP.    Beryl Meager, RN, BC-ADM Inpatient Diabetes Coordinator Pager [phone]

## 2013-01-24 NOTE — Progress Notes (Signed)
Patient ID: [name]  female  YQM:[id number]    DOB: 02/19/1940    DOA: 01/23/2013  PCP: Londell Moh, MD  Assessment/Plan: Active Problems:  S/P total knee replacement  -per orthopedics tream  -when to start Plavix?  HTN (hypertension)  - Currently stable, continue Toprol-XL, Cozaar  Type 2 diabetes mellitus, uncontrolled  - Patient takes Lantus 60 units in a.m. and NovoLog 7 units TID AC. Restarted on her home regimen, sliding scale  Rheumatoid arthritis- chronic immunosuppression  -on Rheumatrex pre admit but will defer to orthopedics re: resume since acutely post op   History of GI bleed/2012  -PPI for stress ulcer prevention especially with use of IV Toradol   H/O mechanical aortic valve replacement/Chronic anticoagulation (Coumadin)  - Coumadin restarted, per cardiology Dyslipidemia  CAD, CABG 1991, redo 2006, S/P DES to Left Main 04/01/12 post cardiac arrest  -per Cards  Cardiomyopathy, ischemic, EF 35-40% 2D 03/31/12/Left ventricular diastolic dysfunction, NYHA class 2  -per Cards- appears compensated   DVT Prophylaxis: On prophylactic Lovenox, Coumadin  Code Status:  Family Communication:  Disposition: PER Primary team    Subjective: Patient seen and examined, no complaints, alert and oriented and eating  Objective: Weight change:   Intake/Output Summary (Last 24 hours) at 01/24/13 1417 Last data filed at 01/24/13 1357  Gross per 24 hour  Intake   2730 ml  Output    965 ml  Net   1765 ml   Blood pressure 137/55, pulse 73, temperature 98.8 F (37.1 C), temperature source Oral, resp. rate 18, height 5\' 2"  (1.575 m), weight 79.379 kg (175 lb), SpO2 99.00%.  Physical Exam: General: Alert and awake, oriented x3, not in any acute distress. CVS: S1-S2 clear, no murmur rubs or gallops Chest: clear to auscultation bilaterally, no wheezing, rales or rhonchi Abdomen: soft nontender, nondistended, normal bowel sounds  Extremities: no cyanosis,  clubbing or edema noted bilaterally Neuro: Cranial nerves II-XII intact, no focal neurological deficits  Lab Results: Basic Metabolic Panel:  Recent Labs Lab 01/23/13 1310 01/24/13 0313  NA  --  135*  K  --  4.5  CL  --  95*  CO2  --  28  GLUCOSE  --  184*  BUN  --  22  CREATININE 1.02 1.24*  CALCIUM  --  8.4   Liver Function Tests: No results found for this basename: AST, ALT, ALKPHOS, BILITOT, PROT, ALBUMIN,  in the last 168 hours No results found for this basename: LIPASE, AMYLASE,  in the last 168 hours No results found for this basename: AMMONIA,  in the last 168 hours CBC:  Recent Labs Lab 01/23/13 1310 01/24/13 0313  WBC 10.6* 12.6*  HGB 10.3* 8.9*  HCT 32.7* 27.7*  MCV 100.9* 99.6  PLT 302 242   Cardiac Enzymes: No results found for this basename: CKTOTAL, CKMB, CKMBINDEX, TROPONINI,  in the last 168 hours BNP: No components found with this basename: POCBNP,  CBG:  Recent Labs Lab 01/23/13 1219 01/23/13 1641 01/23/13 2123 01/24/13 0630 01/24/13 1105  GLUCAP 335* 402* 293* 155* 259*     Micro Results: Recent Results (from the past 240 hour(s))  URINE CULTURE     Status: None   Collection Time    01/16/13 11:46 AM      Result Value Range Status   Specimen Description URINE, CLEAN CATCH   Final   Special Requests NONE   Final   Culture  Setup Time     Final   Value:  01/16/2013 17:56     Performed at [facility] Count     Final   Value: NO GROWTH     Performed at [facility]   Culture     Final   Value: NO GROWTH     Performed at [facility]   Report Status 01/17/2013 FINAL   Final  SURGICAL PCR SCREEN     Status: None   Collection Time    01/16/13 11:46 AM      Result Value Range Status   MRSA, PCR NEGATIVE  NEGATIVE Final   Staphylococcus aureus NEGATIVE  NEGATIVE Final   Comment:            The Xpert SA Assay (FDA     approved for NASAL specimens     in patients over 12 years of age),     is one  component of     a comprehensive surveillance     program.  Test performance has     been validated by [employer] for patients greater     than or equal to 38 year old.     It is not intended     to diagnose infection nor to     guide or monitor treatment.    Studies/Results: Chest 2 View  01/16/2013   CLINICAL DATA:  Preop chest x-ray  EXAM: CHEST  2 VIEW  COMPARISON:  04/03/2012  FINDINGS: Bilateral mild interstitial thickening likely chronic. There is no focal parenchymal opacity, pleural effusion, or pneumothorax. Mild stable cardiomegaly. Prior CABG.  The osseous structures are unremarkable.  IMPRESSION: No active cardiopulmonary disease.   Electronically Signed   By: Elige Ko   On: 01/16/2013 14:12    Medications: Scheduled Meds: . atorvastatin  40 mg Oral q1800  . calcium carbonate  1 tablet Oral BID WC  . docusate sodium  100 mg Oral BID  . enoxaparin (LOVENOX) injection  40 mg Subcutaneous Q24H  . fenofibrate  160 mg Oral Daily  . fenofibrate  160 mg Oral Daily  . folic acid  0.5 mg Oral Daily  . furosemide  40 mg Oral BID  . gabapentin  300 mg Oral TID  . insulin aspart  0-5 Units Subcutaneous QHS  . insulin aspart  0-9 Units Subcutaneous TID WC  . insulin aspart  7 Units Subcutaneous TID WC  . [START ON 01/25/2013] insulin glargine  60 Units Subcutaneous Daily  . losartan  100 mg Oral Daily  . metoprolol succinate  25 mg Oral BID  . multivitamin with minerals  1 tablet Oral Daily  . pantoprazole (PROTONIX) IV  40 mg Intravenous Q24H  . potassium chloride SA  20 mEq Oral Q M,W,F  . warfarin  4 mg Oral ONCE-1800  . Warfarin - Pharmacist Dosing Inpatient   Does not apply q1800      LOS: 1 day   Janthony Holleman M.D. [facility] 01/24/2013, 2:17 PM Pager: [phone]  If 7PM-7AM, please contact night-coverage [email or web address] Password [email or web address]

## 2013-01-24 NOTE — Progress Notes (Signed)
Patient unable to void post foley removal this morning. She does have the feeling to urinate and has some discomfort, but is unable to spontaneously void. Bladder scan performed and pt has atleast of urine in bladder. Per protocol in and out cath performed, of urine returned. Will continue to monitor q6 for spontaneous void.

## 2013-01-24 NOTE — Evaluation (Signed)
Occupational Therapy Evaluation Patient Details Name: [name] MRN: [id number] DOB: [birth year] Today's Date: 01/24/2013 Time: 4010-2725 OT Time Calculation (min): 31 min  OT Assessment / Plan / Recommendation History of present illness Pt is a 73 y/o female admitted s/p R TKA.   Clinical Impression   Pt presents w/ deficits in ability to perform LB ADL's and self care tasks s/p R TKA and pain. She will benefit from acute OT to address deficits and assist in preparation for d/c to next venue of care prior to returning home w/ PRN family assist.    OT Assessment  Patient needs continued OT Services    Follow Up Recommendations  SNF    Barriers to Discharge      Equipment Recommendations  Other (comment) (Defer to next venue)    Recommendations for Other Services    Frequency  Min 2X/week    Precautions / Restrictions Precautions Precautions: Fall;Knee Precaution Comments: Discussed towel roll under heel and NO pillow under knee Restrictions Weight Bearing Restrictions: Yes RLE Weight Bearing: Partial weight bearing RLE Partial Weight Bearing Percentage or Pounds: 50%   Pertinent Vitals/Pain Pt reports "Muscle spasms" R buttocks/hip area & requested "muscle relaxer" RN staff made aware. Pt did not otherwise rate pain. Repositioned, rest at conclusion of session.    ADL  Eating/Feeding: Performed;Independent Where Assessed - Eating/Feeding: Bed level Grooming: Performed;Wash/dry hands;Wash/dry face;Min guard;Set up Where Assessed - Grooming: Supported standing Upper Body Bathing: Performed;Chest;Right arm;Left arm;Abdomen;Minimal assistance Where Assessed - Upper Body Bathing: Supported standing;Supported sit to stand Lower Body Bathing: Performed;Moderate assistance Where Assessed - Lower Body Bathing: Supported sit to stand;Supported standing Upper Body Dressing: Performed;Minimal assistance;Set up (Secondary to IV/lines) Where Assessed - Upper Body Dressing:  Supported sitting;Supported standing Lower Body Dressing: Performed;Moderate assistance Where Assessed - Lower Body Dressing: Supported sit to stand;Supported sitting Toilet Transfer: Performed;Minimal Dentist Method: Sit to Barista: Bedside commode (3:1 in bathroom of pt's room) Toileting - Clothing Manipulation and Hygiene: Performed;Minimal assistance Where Assessed - Engineer, mining and Hygiene: Sit to stand from 3-in-1 or toilet Tub/Shower Transfer Method: Not assessed Equipment Used: Gait belt;Rolling walker Transfers/Ambulation Related to ADLs: Overall Min assist for functional mobility and transfers. Pt requires consistent vc's for safety, sequencing and PWB R LE w/ RW ADL Comments: Pt presents w/ deficits in ability to perform LB ADL's and self care tasks. She will benefit from acute OT to address deficits and assist in preparation for d/c to next venue of care. Pt was educated verbally in a/e and participated in ADL's retraining session sitting and standing at sink today. Vc's for PWB and LB dressing techniques, fatigues easily.    OT Diagnosis: Generalized weakness;Acute pain  OT Problem List: Decreased strength;Decreased activity tolerance;Decreased knowledge of use of DME or AE;Decreased knowledge of precautions;Pain;Impaired UE functional use OT Treatment Interventions: Self-care/ADL training;DME and/or AE instruction;Energy conservation;Patient/family education;Therapeutic activities   OT Goals(Current goals can be found in the care plan section) Acute Rehab OT Goals Patient Stated Goal: To return home with husband after rehab Time For Goal Achievement: 02/07/13 Potential to Achieve Goals: Good  Visit Information  Last OT Received On: 01/24/13 Assistance Needed: +1 History of Present Illness: Pt is a 73 y/o female admitted s/p R TKA.       Prior Functioning     Home Living Family/patient expects to be  discharged to:: Skilled nursing facility Living Arrangements: Spouse/significant other Prior Function Level of Independence: Independent Communication Communication: No difficulties Dominant  Hand: Left    Vision/Perception Vision - History Baseline Vision: Wears glasses all the time   Cognition  Cognition Arousal/Alertness: Awake/alert Behavior During Therapy: WFL for tasks assessed/performed Overall Cognitive Status: Within Functional Limits for tasks assessed    Extremity/Trunk Assessment Upper Extremity Assessment Upper Extremity Assessment: LUE deficits/detail LUE Deficits / Details: H/O Left UE RTC tear. Has had out pt therapy for this in the past. AROM WFL's for ADL's.  Lower Extremity Assessment Lower Extremity Assessment: Defer to PT evaluation;RLE deficits/detail RLE Deficits / Details: Decreased strength and AROM consistent with TKA RLE: Unable to fully assess due to pain Cervical / Trunk Assessment Cervical / Trunk Assessment: Kyphotic   Mobility   Min assist bed mobility. Assist w/ RUE to shift trunk and scoot to EOB w/ HOB elevated. Pt able to independently lift/move RLE noted.         Balance  Not formally assessed. WFL's for tasks performed today.   End of Session OT - End of Session Equipment Utilized During Treatment: Gait belt;Rolling walker Activity Tolerance: Patient tolerated treatment well Patient left: in chair;with call bell/phone within reach Nurse Communication: Patient requests pain meds (Pt requested "muscle relaxer") CPM Right Knee CPM Right Knee: Off  GO     Alm Bustard 01/24/2013, 8:49 AM

## 2013-01-24 NOTE — Op Note (Signed)
[name], [name] NO.:  [id number]  MEDICAL RECORD NO.:  [id number]  LOCATION:  5N02C                        FACILITY:  [facility]  PHYSICIAN:  [name], M.D.DATE OF BIRTH:  05/14/1940  DATE OF PROCEDURE:  01/23/2013 DATE OF DISCHARGE:                              OPERATIVE REPORT   PREOPERATIVE DIAGNOSIS:  End-stage osteoarthritis, right knee.  POSTOPERATIVE DIAGNOSIS:  End-stage osteoarthritis, right knee.  PROCEDURE:  Right total knee replacement.  SURGEON:  Laura Kotyk. Durward Fortes, MD  ASSISTANT:  Biagio Borg was present throughout the operative procedure to ensure its timely completion.  ANESTHESIA:  General.  COMPLICATIONS:  None.  COMPONENTS:  DePuy LCS medium femoral component, a #2.5 tibial tray, a 10-mm polyethylene bridging bearing, a 3 peg metal back rotating patella.  Components were secured with polymethyl methacrylate.  PROCEDURE:  [name] was met in the holding area with her daughter, identified the right knee as appropriate operative site and marked it accordingly.  Any questions were answered.  The patient was then transported to room #7 and placed under general anesthesia without difficulty.  Nursing staff inserted a Foley catheter. Urine was clear.  Right thigh tourniquet was then applied.  The right lower extremity was then prepped with chlorhexidine scrub and then DuraPrep from the tourniquet to the tips of the toes.  Sterile draping was performed.  Time-out was called.  With the extremity elevated, it was Esmarch exsanguinated with a proximal tourniquet at 350 mmHg.  Midline longitudinal incision was made, centered about the patella, extending from the superior pouch to tibial tubercle.  Via sharp dissection, incision was carried down to the subcutaneous tissue.  First layer of capsule was incised in midline.  A medial parapatellar incision was made with a Bovie.  The joint was then entered.  There was a clear  yellow joint effusion of approximately 30 mL in volume.  Patella was easily everted to 180 degrees.  Knee flexed to 90 degrees.  There was moderate amount of synovitis.  Synovectomy was performed.  There were large osteophytes along the medial and lateral femoral condyle which removed for sizing purposes that measured a medium femoral component.  There was complete absence of articular cartilage in the medial femoral condyle and corresponding tibial plateau.  There were small osteophytes along the medial tibial plateau, which were also removed.  First, bony cut was made transversely in the proximal tibia using the external guide, and the 7 degrees angle of declination.  Subsequent bony cuts were then made on the femur using the medial jig.  MCL and LCL remained intact throughout the operative procedure.  Lamina spreader was inserted along the medial and lateral compartment to remove medial and lateral menisci, ACL and PCL, and osteophytes in the medial and lateral femoral condyle using a 3/4-inch curved osteotome.  There was a small Baker cyst identified posteromedially.  It is visualized lining was bovied.  Final cuts were then made on the femur for tapering purposes and to obtain the center holes.  Retractors were then placed around the tibia, measured at 2.5 tibial component, this was pinned in place.  A center hole was made followed by the keeled cut.  With  the tibial jig and trial jig in place, a 10-mm bridging bearing was trialed.  Bridging bearing was then inserted as flexion and extension gaps were perfectly symmetrical throughout the case at 10 mm.  This is followed by the trial standard medium femoral condyle, the medium femoral prosthesis.  The knee was reduced through full range of motion.  There was no opening with varus or valgus stress, full extension, and no malrotation of the tibial tray.  The patella was prepared by removing 10 mm of bone, leaving 13 mm of patella  thickness.  Three holes were then made followed by the trial patella and this was reduced and through a full range of motion, it remained perfectly stable.  Trial components were removed.  The joint was then copiously irrigated with saline solution.  The final components were then impacted with polymethyl methacrylate. We initially inserted a tibial tray followed by the 10-mm bridging bearing and then the medium femoral component.  The knee was placed in full extension.  Extraneous methacrylate was removed from the periphery of the components.  The knee held in extension.  The patellar component was then impacted with polymethyl methacrylate.  While waiting for the methacrylate to mature, the joint was irrigated and then infiltrated with 0.25% Marcaine with epinephrine.  At approximately 16 minutes of methacrylate matured, the joint was explored without evidence of loose methacrylate.  The tourniquet was deflated at 63 minutes.  We had nice bleeding within the joint surface. Any gross bleeders were Bovie coagulated.  Medium Hemovac was inserted to the lateral compartment.  Deep capsule was then closed with a combination of running and interrupted #1 Ethibond.  Superficial capsule closed with running 0 Vicryl, subcu with 3-0 Monocryl.  Skin closed with skin clips.  Sterile bulky dressing was applied followed by the patient's support stocking.  The patient tolerated the procedure well without complications.     [name], M.D.     PWW/MEDQ  D:  01/23/2013  T:  01/24/2013  Job:  [id number]

## 2013-01-24 NOTE — Progress Notes (Signed)
ANTICOAGULATION CONSULT NOTE - Follow Up  Pharmacy Consult for coumadin Indication: mechanical AVR  Allergies  Allergen Reactions  . Shellfish Allergy Anaphylaxis    Not allergic to betadine  . Codeine Other (See Comments)    Feels sick   Vital Signs: Temp: 98.8 F (37.1 C) (01/07 0550) BP: 137/55 mmHg (01/07 0550) Pulse Rate: 73 (01/07 0550)  Labs:  Recent Labs  01/23/13 0555 01/23/13 1310 01/24/13 0313  HGB  --  10.3* 8.9*  HCT  --  32.7* 27.7*  PLT  --  302 242  APTT 26  --   --   LABPROT 13.9  --  14.8  INR 1.09  --  1.19  CREATININE  --  1.02 1.24*   Estimated Creatinine Clearance: 40 ml/min (by C-G formula based on Cr of 1.24).  Medical History: Past Medical History  Diagnosis Date  . High blood pressure   . Irregular heart rhythm   . High cholesterol   . Coronary artery disease   . COPD (chronic obstructive pulmonary disease)   . Anemia   . Irregular heart beat   . Myocardial infarction march 2013  . Peripheral vascular disease   . Cardiac arrest   . CHF (congestive heart failure) 01/06/12    Transthoracic Echocardiogram EF 45-50%  . Memory deficits 09/06/2012  . Polyneuropathy in diabetes(357.2)   . Degenerative arthritis   . Fibromyalgia   . Diabetes mellitus     Type II  . Heart murmur   . Angina at rest     no new symptoms since 11/08/12 (01/17/13)   Assessment: Patient is a 73 y.o F on coumadin PTA for mechanical AVR.  Anticoag-clinic's note on 1/2 indicated that her INR goal is 2.5-3.5. She's now s/p right TKA.  INR today is 1.19 after one dose of 4mg .  Her H/H is lower post-op but she has no s/s of bleeding complications.  Platelets are stable.  HOME Dose:  Warfarin 2.5 mg daily Drug/Drug Interactions: (She was on this combination PTA) Anticoagulants / Fibric Acid Derivatives  Warning: The hypoprothrombinemic effect of warfarin may be increased by fenofibrate. Bleeding may occur.  Goal of Therapy:  INR 2.5-3.5   Plan:  1) Coumadin  4mg  PO x1 today 2) Will watch for s/s of bleeding and monitor for drug/drug interactions with Warfarin. 3) Daily PT/INR  , PharmD., MS Clinical Pharmacist Pager:  [phone] Thank you for allowing pharmacy to be part of this patients care team. 01/24/2013,9:43 AM

## 2013-01-24 NOTE — Progress Notes (Signed)
Dr Isidoro Donning called regarding patients home dose of Plavix. Ortho PA  contacted and agrees to starting 75mg  PO Plavix tomorrow morning and discontinuing Lovenox after today's dose.

## 2013-01-25 DIAGNOSIS — M1711 Unilateral primary osteoarthritis, right knee: Secondary | ICD-10-CM | POA: Diagnosis present

## 2013-01-25 DIAGNOSIS — I70219 Atherosclerosis of native arteries of extremities with intermittent claudication, unspecified extremity: Secondary | ICD-10-CM

## 2013-01-25 DIAGNOSIS — J810 Acute pulmonary edema: Secondary | ICD-10-CM

## 2013-01-25 LAB — URINE MICROSCOPIC-ADD ON

## 2013-01-25 LAB — URINALYSIS, ROUTINE W REFLEX MICROSCOPIC
Bilirubin Urine: NEGATIVE
GLUCOSE, UA: NEGATIVE mg/dL
KETONES UR: NEGATIVE mg/dL
LEUKOCYTES UA: NEGATIVE
Nitrite: NEGATIVE
PH: 5 (ref 5.0–8.0)
Protein, ur: 30 mg/dL — AB
Specific Gravity, Urine: 1.011 (ref 1.005–1.030)
Urobilinogen, UA: 0.2 mg/dL (ref 0.0–1.0)

## 2013-01-25 LAB — BASIC METABOLIC PANEL
BUN: 30 mg/dL — AB (ref 6–23)
CHLORIDE: 92 meq/L — AB (ref 96–112)
CO2: 24 mEq/L (ref 19–32)
CREATININE: 1.57 mg/dL — AB (ref 0.50–1.10)
Calcium: 8.6 mg/dL (ref 8.4–10.5)
GFR calc non Af Amer: 32 mL/min — ABNORMAL LOW (ref >90)
GFR, EST AFRICAN AMERICAN: 37 mL/min — AB (ref >90)
Glucose, Bld: 166 mg/dL — ABNORMAL HIGH (ref 70–99)
Potassium: 4.6 mEq/L (ref 3.7–5.3)
Sodium: 135 mEq/L — ABNORMAL LOW (ref 137–147)

## 2013-01-25 LAB — CBC
HEMATOCRIT: 29.5 % — AB (ref 36.0–46.0)
Hemoglobin: 9.5 g/dL — ABNORMAL LOW (ref 12.0–15.0)
MCH: 32.1 pg (ref 26.0–34.0)
MCHC: 32.2 g/dL (ref 30.0–36.0)
MCV: 99.7 fL (ref 78.0–100.0)
PLATELETS: 245 10*3/uL (ref 150–400)
RBC: 2.96 MIL/uL — ABNORMAL LOW (ref 3.87–5.11)
RDW: 14.9 % (ref 11.5–15.5)
WBC: 14.6 10*3/uL — AB (ref 4.0–10.5)

## 2013-01-25 LAB — PROTIME-INR
INR: 1.67 — ABNORMAL HIGH (ref 0.00–1.49)
PROTHROMBIN TIME: 19.2 s — AB (ref 11.6–15.2)

## 2013-01-25 LAB — GLUCOSE, CAPILLARY
GLUCOSE-CAPILLARY: 147 mg/dL — AB (ref 70–99)
Glucose-Capillary: 165 mg/dL — ABNORMAL HIGH (ref 70–99)
Glucose-Capillary: 277 mg/dL — ABNORMAL HIGH (ref 70–99)
Glucose-Capillary: 367 mg/dL — ABNORMAL HIGH (ref 70–99)

## 2013-01-25 MED ORDER — HYDRALAZINE HCL 20 MG/ML IJ SOLN: 10.0000 mg | Freq: Four times a day (QID) | INTRAMUSCULAR | Status: DC | PRN

## 2013-01-25 MED ORDER — BISACODYL 10 MG RE SUPP: 10.0000 mg | Freq: Every day | RECTAL | Status: DC | PRN

## 2013-01-25 MED ORDER — WARFARIN SODIUM 2.5 MG PO TABS
2.5000 mg | ORAL_TABLET | Freq: Once | ORAL | Status: AC
  Administered 2013-01-25: 2.5 mg via ORAL
  Filled 2013-01-25: qty 1

## 2013-01-25 MED ORDER — INSULIN GLARGINE 100 UNIT/ML ~~LOC~~ SOLN: 60.0000 [IU] | Freq: Every day | SUBCUTANEOUS | Status: DC

## 2013-01-25 MED ORDER — DSS 100 MG PO CAPS: 100.0000 mg | ORAL_CAPSULE | Freq: Two times a day (BID) | ORAL | Status: AC

## 2013-01-25 MED ORDER — ACETAMINOPHEN 325 MG PO TABS: 650.0000 mg | ORAL_TABLET | Freq: Four times a day (QID) | ORAL | Status: DC

## 2013-01-25 MED ORDER — ACETAMINOPHEN 325 MG PO TABS
650.0000 mg | ORAL_TABLET | Freq: Four times a day (QID) | ORAL | Status: DC
  Administered 2013-01-25 – 2013-01-26 (×3): 650 mg via ORAL
  Filled 2013-01-25 (×2): qty 2

## 2013-01-25 MED ORDER — TRAMADOL HCL 50 MG PO TABS
25.0000 mg | ORAL_TABLET | Freq: Four times a day (QID) | ORAL | Status: DC | PRN
  Administered 2013-01-25 – 2013-01-26 (×2): 25 mg via ORAL
  Filled 2013-01-25 (×2): qty 1

## 2013-01-25 MED ORDER — TRAMADOL HCL 50 MG PO TABS: 25.0000 mg | ORAL_TABLET | Freq: Four times a day (QID) | ORAL | Status: DC | PRN

## 2013-01-25 MED ORDER — CYCLOBENZAPRINE HCL 10 MG PO TABS: 5.0000 mg | ORAL_TABLET | Freq: Three times a day (TID) | ORAL | Status: DC | PRN

## 2013-01-25 NOTE — Progress Notes (Signed)
Patient ID: [name], female   DOB: 05-May-1940, 72 y.o.   MRN: [id number] PATIENT ID: [name]        MRN:  [id number]          DOB/AGE: 1940/04/28 / 73 y.o.    [name], MD   [name], PA-C [address] [place], [place]  [address]                             [phone]   PROGRESS NOTE  Subjective:  negative for Chest Pain  negative for Shortness of Breath  negative for Nausea/Vomiting   negative for Calf Pain    Tolerating Diet: yes         Patient reports pain as mild and moderate.     Patient confused most of last night-better this pm, but somewhat "groggy"-family aware and trying to limit pain meds, hold robaxin  Objective: Vital signs in last 24 hours:   Patient Vitals for the past 24 hrs:  BP Temp Temp src Pulse Resp SpO2 Weight  01/25/13 1358 151/45 mmHg 99.1 F (37.3 C) - 75 20 97 % -  01/25/13 0650 - - - - - - 83.1 kg (183 lb 3.2 oz)  01/25/13 0552 153/49 mmHg 98.6 F (37 C) Oral 71 19 98 % -  01/25/13 0400 - - - - 18 - -  01/25/13 0000 - - - - 20 - -  01/24/13 2205 144/68 mmHg - - - - - -  01/24/13 2155 162/105 mmHg 98.1 F (36.7 C) Oral 85 19 99 % -  01/24/13 2000 - - - - 18 - -      Intake/Output from previous day:   01/07 0701 - 01/08 0700 In: 2170 [P.O.:2130; I.V.:40] Out: 1500 [Urine:1500]   Intake/Output this shift:   01/08 0701 - 01/08 1900 In: -  Out: 450 [Urine:450]   Intake/Output     01/07 0701 - 01/08 0700 01/08 0701 - 01/09 0700   P.O. 2130    I.V. (mL/kg) 40 (0.5)    IV Piggyback     Total Intake(mL/kg) 2170 (26.1)    Urine (mL/kg/hr) 1500 (0.8) 450 (0.5)   Drains     Blood     Total Output 1500 450   Net +670 -450           LABORATORY DATA:  Recent Labs  01/23/13 1310 01/24/13 0313 01/25/13 0430  WBC 10.6* 12.6* 14.6*  HGB 10.3* 8.9* 9.5*  HCT 32.7* 27.7* 29.5*  PLT 302 242 245    Recent Labs  01/23/13 1310 01/24/13 0313 01/25/13 0430  NA  --  135* 135*  K  --  4.5 4.6  CL  --  95*  92*  CO2  --  28 24  BUN  --  22 30*  CREATININE 1.02 1.24* 1.57*  GLUCOSE  --  184* 166*  CALCIUM  --  8.4 8.6   Lab Results  Component Value Date   INR 1.67* 01/25/2013   INR 1.19 01/24/2013   INR 1.09 01/23/2013    Recent Radiographic Studies :  Chest 2 View  01/16/2013   CLINICAL DATA:  Preop chest x-ray  EXAM: CHEST  2 VIEW  COMPARISON:  04/03/2012  FINDINGS: Bilateral mild interstitial thickening likely chronic. There is no focal parenchymal opacity, pleural effusion, or pneumothorax. Mild stable cardiomegaly. Prior CABG.  The osseous structures are unremarkable.  IMPRESSION: No active  cardiopulmonary disease.   Electronically Signed   By: Elige Ko   On: 01/16/2013 14:12     Examination:  General appearance: alert, distracted, mild distress and slowed mentation  Wound Exam: clean, dry, intact   Drainage:  None: wound tissue dry  Motor Exam: EHL, FHL, Anterior Tibial and Posterior Tibial Intact  Sensory Exam: Superficial Peroneal, Deep Peroneal and Tibial normal  Vascular Exam: Normal  Assessment:    2 Days Post-Op  Procedure(s) (LRB): RIGHT TOTAL KNEE ARTHROPLASTY (Right)  ADDITIONAL DIAGNOSIS:  Active Problems:   History of GI bleed/2012   CAD, CABG 1991, redo 2006, S/P DES to Left Main 04/01/12   H/O mechanical aortic valve replacement, St Jude 1991   Chronic anticoagulation with Coumadin   HTN (hypertension)   Cardiomyopathy, ischemic, EF 35-40% 2D 03/31/12   Type 2 diabetes mellitus, uncontrolled   Cardiac arrest 04/01/12   Rheumatoid arthritis- chronic steroids   S/P total knee replacement   Left ventricular diastolic dysfunction, NYHA class 2  Acute Blood Loss Anemia and Renal Insufficiency Chronic-seems stable   Plan: Physical Therapy as ordered Partial Weight Bearing @ 50% (PWB)  DVT Prophylaxis:  Lovenox and Coumadin  DISCHARGE PLAN: Skilled Nursing Facility/Rehab  DISCHARGE NEEDS: HHPT, CPM, Walker and 3-in-1 comode seat   will monitor in am  and consider rehab if cardiology and hospitalists agree, dressing changed, no change in n/v exam from pre op status D/c oxycodone, try tramadol, continue tylenol q6h      [facility] 01/25/2013 5:16 PM

## 2013-01-25 NOTE — Progress Notes (Signed)
Assisted pt to [facility], sat for 10-15 minutes, unable to spontaneously void. Two I&O cath's done so far, foley replaced at 0650 per hospital policy. of slightly cloudy, yellow urine returned, malodorous. UA sent.

## 2013-01-25 NOTE — Discharge Instructions (Addendum)
Information on my medicine - Coumadin   (Warfarin)  This medication education was reviewed with me or my healthcare representative as part of my discharge preparation.  The pharmacist that spoke with me during my hospital stay was:  Lucia Gaskins, [facility]  Why was Coumadin prescribed for you? Coumadin was prescribed for you because you have a blood clot or a medical condition that can cause an increased risk of forming blood clots. Blood clots can cause serious health problems by blocking the flow of blood to the heart, lung, or brain. Coumadin can prevent harmful blood clots from forming. As a reminder your indication for Coumadin is:   Blood Clot Prevention After Orthopedic Surgeryand mechanical valve  What test will check on my response to Coumadin? While on Coumadin (warfarin) you will need to have an INR test regularly to ensure that your dose is keeping you in the desired range. The INR (international normalized ratio) number is calculated from the result of the laboratory test called prothrombin time (PT).  If an INR APPOINTMENT HAS NOT ALREADY BEEN MADE FOR YOU please schedule an appointment to have this lab work done by your health care provider within 7 days. Your INR goal is : 2.5-3.5  Ask your health care provider during an office visit what your goal INR is.  What  do you need to  know  About  COUMADIN? Take Coumadin (warfarin) exactly as prescribed by your healthcare provider about the same time each day.  DO NOT stop taking without talking to the doctor who prescribed the medication.  Stopping without other blood clot prevention medication to take the place of Coumadin may increase your risk of developing a new clot or stroke.  Get refills before you run out.  What do you do if you miss a dose? If you miss a dose, take it as soon as you remember on the same day then continue your regularly scheduled regimen the next day.  Do not take two doses of Coumadin at the same time.  Important  Safety Information A possible side effect of Coumadin (Warfarin) is an increased risk of bleeding. You should call your healthcare provider right away if you experience any of the following:   Bleeding from an injury or your nose that does not stop.   Unusual colored urine (red or dark brown) or unusual colored stools (red or black).   Unusual bruising for unknown reasons.   A serious fall or if you hit your head (even if there is no bleeding).  Some foods or medicines interact with Coumadin (warfarin) and might alter your response to warfarin. To help avoid this:   Eat a balanced diet, maintaining a consistent amount of Vitamin K.   Notify your provider about major diet changes you plan to make.   Avoid alcohol or limit your intake to 1 drink for women and 2 drinks for men per day. (1 drink is 5 oz. wine, 12 oz. beer, or 1.5 oz. liquor.)  Make sure that ANY health care provider who prescribes medication for you knows that you are taking Coumadin (warfarin).  Also make sure the healthcare provider who is monitoring your Coumadin knows when you have started a new medication including herbals and non-prescription products.  Coumadin (Warfarin)  Major Drug Interactions  Increased Warfarin Effect Decreased Warfarin Effect  Alcohol (large quantities) Antibiotics (esp. Septra/Bactrim, Flagyl, Cipro) Amiodarone (Cordarone) Aspirin (ASA) Cimetidine (Tagamet) Megestrol (Megace) NSAIDs (ibuprofen, naproxen, etc.) Piroxicam (Feldene) Propafenone (Rythmol SR) Propranolol (Inderal)  Isoniazid (INH) Posaconazole (Noxafil) Barbiturates (Phenobarbital) Carbamazepine (Tegretol) Chlordiazepoxide (Librium) Cholestyramine (Questran) Griseofulvin Oral Contraceptives Rifampin Sucralfate (Carafate) Vitamin K   Coumadin (Warfarin) Major Herbal Interactions  Increased Warfarin Effect Decreased Warfarin Effect  Garlic Ginseng Ginkgo biloba Coenzyme Q10 Green tea St. Johns wort    Coumadin  (Warfarin) FOOD Interactions  Eat a consistent number of servings per week of foods HIGH in Vitamin K (1 serving =  cup)  Collards (cooked, or boiled & drained) Kale (cooked, or boiled & drained) Mustard greens (cooked, or boiled & drained) Parsley *serving size only =  cup Spinach (cooked, or boiled & drained) Swiss chard (cooked, or boiled & drained) Turnip greens (cooked, or boiled & drained)  Eat a consistent number of servings per week of foods MEDIUM-HIGH in Vitamin K (1 serving = 1 cup)  Asparagus (cooked, or boiled & drained) Broccoli (cooked, boiled & drained, or raw & chopped) Brussel sprouts (cooked, or boiled & drained) *serving size only =  cup Lettuce, raw (green leaf, endive, romaine) Spinach, raw Turnip greens, raw & chopped   These websites have more information on Coumadin (warfarin):  http://www.king-russell.com/; https://www.hines.net/;

## 2013-01-25 NOTE — Progress Notes (Signed)
Patient: [name] / Admit Date: 01/23/2013 / Date of Encounter: 01/25/2013, 2:05 PM   Subjective  No CP or SOB. Patient complains of severe knee pain; had somnelence earlier from pain meds per patient's daughter  Objective   Telemetry: sinus.  Physical Exam: Blood pressure 151/45, pulse 75, temperature 99.1 F (37.3 C), temperature source Oral, resp. rate 20, height 5\' 2"  (1.575 m), weight 183 lb 3.2 oz (83.1 kg), SpO2 97.00%. General: Well developed, well nourished WF in mild distress. Head: Normal Neck: supple Lungs: CTA anteriorly Heart: RRR Crisp mechanical valve sound Abdomen: Soft, non-tender, non-distended Extremities: No edema. Right knee s/p TKA Neuro: Moves all extremities spontaneously.    Intake/Output Summary (Last 24 hours) at 01/25/13 1405 Last data filed at 01/25/13 1400  Gross per 24 hour  Intake    600 ml  Output   1950 ml  Net  -1350 ml    Inpatient Medications:  . atorvastatin  40 mg Oral q1800  . calcium carbonate  1 tablet Oral BID WC  . clopidogrel  75 mg Oral Q breakfast  . docusate sodium  100 mg Oral BID  . fenofibrate  160 mg Oral Daily  . fenofibrate  160 mg Oral Daily  . folic acid  0.5 mg Oral Daily  . gabapentin  300 mg Oral TID  . insulin aspart  0-5 Units Subcutaneous QHS  . insulin aspart  0-9 Units Subcutaneous TID WC  . insulin aspart  7 Units Subcutaneous TID WC  . insulin glargine  60 Units Subcutaneous Daily  . metoprolol succinate  25 mg Oral BID  . multivitamin with minerals  1 tablet Oral Daily  . pantoprazole  40 mg Oral Q0600  . potassium chloride SA  20 mEq Oral Q M,W,F  . warfarin  2.5 mg Oral ONCE-1800  . Warfarin - Pharmacist Dosing Inpatient   Does not apply q1800   Infusions:    Labs:  Recent Labs  01/24/13 0313 01/25/13 0430  NA 135* 135*  K 4.5 4.6  CL 95* 92*  CO2 28 24  GLUCOSE 184* 166*  BUN 22 30*  CREATININE 1.24* 1.57*  CALCIUM 8.4 8.6    Recent Labs  01/24/13 0313 01/25/13 0430    WBC 12.6* 14.6*  HGB 8.9* 9.5*  HCT 27.7* 29.5*  MCV 99.6 99.7  PLT 242 245    Recent Labs  01/23/13 1310  HGBA1C 8.9*     Radiology/Studies:  Chest 2 View 01/16/2013   CLINICAL DATA:  Preop chest x-ray  EXAM: CHEST  2 VIEW  COMPARISON:  04/03/2012  FINDINGS: Bilateral mild interstitial thickening likely chronic. There is no focal parenchymal opacity, pleural effusion, or pneumothorax. Mild stable cardiomegaly. Prior CABG.  The osseous structures are unremarkable.  IMPRESSION: No active cardiopulmonary disease.   Electronically Signed   By: 04/05/2012   On: 01/16/2013 14:12     Assessment and Plan  1. OA s/p R TKA - complains of significant pain; management per orthopedic surgery. 2. Coronary disease s/p CABG 1991, redo 2006, s/p DES to [facility] 04/01/12. Patient is asymptomatic. She is hemodynamically stable. Will resume plavix 3. S/p St. Jude Mechnical AVR - Coumadin resumed.  4. Hypertension - continue home meds. 5. Diabetes mellitus type 2, uncontrolled A1C 8.9 - per primary team. 6. Hypercholesterolemia - continue statin. 7. ICM EF 35-40% by echo 03/2012, hx cardiac arrest at that time with torsades - stable, appears euvolemic. Continue home meds. 8. ABL anemia - likely related  to surgery. Follow. 9. Acute kidney injury - hold lasix for now. Cozaar also held; resume as renal function stabilizes.  Signed, Olga Millers MD

## 2013-01-25 NOTE — Progress Notes (Signed)
Patient ID: [name]  female  FMB:[id number]    DOB: July 11, 1940    DOA: 01/23/2013  PCP: Londell Moh, MD  Assessment/Plan: Active Problems:  S/P total knee replacement  -per orthopedics tream  -Patient somewhat lethargic today, discussed in detail with patient's daughter who reported that had drowsiness started after Robaxin. I have discontinued Robaxin, change to flexeril only if needed.  HTN (hypertension)  - Currently stable, continue Toprol-XL, placed on hydralazine as needed, hold Lasix and Cozaar due to worsened creatinine  Type 2 diabetes mellitus - Patient takes Lantus 60 units in a.m. and NovoLog 7 units TID AC, on her home regimen, sliding scale  Rheumatoid arthritis- chronic immunosuppression  -on Rheumatrex pre admit but will defer to orthopedics re: resume since acutely post op   History of GI bleed/2012  -PPI for stress ulcer prevention especially with use of IV Toradol   H/O mechanical aortic valve replacement/Chronic anticoagulation (Coumadin)  - Coumadin restarted, per cardiology Dyslipidemia  CAD, CABG 1991, redo 2006, S/P DES to Left Main 04/01/12 post cardiac arrest  -per Cards , restarted on Plavix Cardiomyopathy, ischemic, EF 35-40% 2D 03/31/12/Left ventricular diastolic dysfunction, NYHA class 2  -per Cards- appears compensated   DVT Prophylaxis: On prophylactic Lovenox, Coumadin  Code Status:  Family Communication: Discussed in detail with patient's daughter at the bedside  Disposition: PER Primary team    Subjective: Patient seen and examined, somewhat lethargic today, somnolent, per daughter at the bedside started after she was given Robaxin doses yesterday, had significant pain last night, hence required pain medications as well.  Objective: Weight change: 3.721 kg (8 lb 3.2 oz)  Intake/Output Summary (Last 24 hours) at 01/25/13 1248 Last data filed at 01/25/13 0650  Gross per 24 hour  Intake    960 ml  Output   1500 ml  Net    -540 ml   Blood pressure 153/49, pulse 71, temperature 98.6 F (37 C), temperature source Oral, resp. rate 19, height 5\' 2"  (1.575 m), weight 83.1 kg (183 lb 3.2 oz), SpO2 98.00%.  Physical Exam: General: Somnolent and lethargic, easily arousable CVS: S1-S2 clear, no murmur rubs or gallops Chest: clear to auscultation bilaterally, no wheezing, rales or rhonchi Abdomen: soft nontender, nondistended, normal bowel sounds  Extremities: no cyanosis, clubbing or edema noted bilaterally   Lab Results: Basic Metabolic Panel:  Recent Labs Lab 01/24/13 0313 01/25/13 0430  NA 135* 135*  K 4.5 4.6  CL 95* 92*  CO2 28 24  GLUCOSE 184* 166*  BUN 22 30*  CREATININE 1.24* 1.57*  CALCIUM 8.4 8.6   Liver Function Tests: No results found for this basename: AST, ALT, ALKPHOS, BILITOT, PROT, ALBUMIN,  in the last 168 hours No results found for this basename: LIPASE, AMYLASE,  in the last 168 hours No results found for this basename: AMMONIA,  in the last 168 hours CBC:  Recent Labs Lab 01/24/13 0313 01/25/13 0430  WBC 12.6* 14.6*  HGB 8.9* 9.5*  HCT 27.7* 29.5*  MCV 99.6 99.7  PLT 242 245   Cardiac Enzymes: No results found for this basename: CKTOTAL, CKMB, CKMBINDEX, TROPONINI,  in the last 168 hours BNP: No components found with this basename: POCBNP,  CBG:  Recent Labs Lab 01/24/13 1105 01/24/13 1635 01/24/13 2148 01/25/13 0653 01/25/13 1149  GLUCAP 259* 164* 192* 147* 165*     Micro Results: Recent Results (from the past 240 hour(s))  URINE CULTURE     Status: None   Collection Time  01/16/13 11:46 AM      Result Value Range Status   Specimen Description URINE, CLEAN CATCH   Final   Special Requests NONE   Final   Culture  Setup Time     Final   Value: 01/16/2013 17:56     Performed at [facility] Count     Final   Value: NO GROWTH     Performed at [facility]   Culture     Final   Value: NO GROWTH     Performed at [facility]   Report Status 01/17/2013 FINAL   Final  SURGICAL PCR SCREEN     Status: None   Collection Time    01/16/13 11:46 AM      Result Value Range Status   MRSA, PCR NEGATIVE  NEGATIVE Final   Staphylococcus aureus NEGATIVE  NEGATIVE Final   Comment:            The Xpert SA Assay (FDA     approved for NASAL specimens     in patients over 59 years of age),     is one component of     a comprehensive surveillance     program.  Test performance has     been validated by [employer] for patients greater     than or equal to 44 year old.     It is not intended     to diagnose infection nor to     guide or monitor treatment.    Studies/Results: Chest 2 View  01/16/2013   CLINICAL DATA:  Preop chest x-ray  EXAM: CHEST  2 VIEW  COMPARISON:  04/03/2012  FINDINGS: Bilateral mild interstitial thickening likely chronic. There is no focal parenchymal opacity, pleural effusion, or pneumothorax. Mild stable cardiomegaly. Prior CABG.  The osseous structures are unremarkable.  IMPRESSION: No active cardiopulmonary disease.   Electronically Signed   By: Elige Ko   On: 01/16/2013 14:12    Medications: Scheduled Meds: . atorvastatin  40 mg Oral q1800  . calcium carbonate  1 tablet Oral BID WC  . clopidogrel  75 mg Oral Q breakfast  . docusate sodium  100 mg Oral BID  . fenofibrate  160 mg Oral Daily  . fenofibrate  160 mg Oral Daily  . folic acid  0.5 mg Oral Daily  . gabapentin  300 mg Oral TID  . insulin aspart  0-5 Units Subcutaneous QHS  . insulin aspart  0-9 Units Subcutaneous TID WC  . insulin aspart  7 Units Subcutaneous TID WC  . insulin glargine  60 Units Subcutaneous Daily  . metoprolol succinate  25 mg Oral BID  . multivitamin with minerals  1 tablet Oral Daily  . pantoprazole  40 mg Oral Q0600  . potassium chloride SA  20 mEq Oral Q M,W,F  . warfarin  2.5 mg Oral ONCE-1800  . Warfarin - Pharmacist Dosing Inpatient   Does not apply q1800      LOS: 2 days    Jassiel Flye M.D. [facility] 01/25/2013, 12:48 PM Pager: [phone]  If 7PM-7AM, please contact night-coverage [email or web address] Password [email or web address]

## 2013-01-25 NOTE — Discharge Summary (Signed)
[name], MD   [name], PA-C [address], [place], [place]  [address]                             [phone]  PATIENT ID: [name]        MRN:  [id number]          DOB/AGE: [birth year] / 73 y.o.    DISCHARGE SUMMARY  ADMISSION DATE:    01/23/2013 DISCHARGE DATE:   01/26/2013   ADMISSION DIAGNOSIS: END STAGE RIGHT KNEE OSTEOARTHRITIS    DISCHARGE DIAGNOSIS:  END STAGE RIGHT KNEE OSTEOARTHRITIS    ADDITIONAL DIAGNOSIS: Principal Problem:   Osteoarthritis of right knee Active Problems:   History of GI bleed/2012   CAD, CABG 1991, redo 2006, S/P DES to Left Main 04/01/12   H/O mechanical aortic valve replacement, [employer] 1991   Chronic anticoagulation with Coumadin   HTN (hypertension)   Cardiomyopathy, ischemic, EF 35-40% 2D 03/31/12   Type 2 diabetes mellitus, uncontrolled   Cardiac arrest 04/01/12   Rheumatoid arthritis- chronic steroids   S/P total knee replacement   Left ventricular diastolic dysfunction, NYHA class 2  Past Medical History  Diagnosis Date  . High blood pressure   . Irregular heart rhythm   . High cholesterol   . Coronary artery disease   . COPD (chronic obstructive pulmonary disease)   . Anemia   . Irregular heart beat   . Myocardial infarction march 2013  . Peripheral vascular disease   . Cardiac arrest   . CHF (congestive heart failure) 01/06/12    Transthoracic Echocardiogram EF 45-50%  . Memory deficits 09/06/2012  . Polyneuropathy in diabetes(357.2)   . Degenerative arthritis   . Fibromyalgia   . Diabetes mellitus     Type II  . Heart murmur   . Angina at rest     no new symptoms since 11/08/12 (01/17/13)  . DM type 2 (diabetes mellitus, type 2)     PROCEDURE: Procedure(s): RIGHT TOTAL KNEE ARTHROPLASTY Right on 01/23/2013  CONSULTS:  Treatment Team:  [facility], MD Costin Otelia Sergeant, MD   HISTORY: Kitrina is a very pleasant 73 year old white female who is seen today for evaluation of her right knee.  She has had problems with her right knee for many years, unfortunately she has progressed to the point where she is having constant knee pain with difficulty with activities of daily living. She has pretty much now down to a moderately severe pain which is aching and throbbing with occasional sharpness to it. It keeps her awake at nighttime. She has had multiple injections to the knee including that of corticosteroids and viscosupplementation. They had been at least temporarily beneficial. She even has been tried with prednisone 10 mg daily for her rheumatoid arthritis and also for her knee pain. It has now come to the point where she has had progressively worsening pain and discomfort    HOSPITAL COURSE:  DORIAN RENFRO is a 73 y.o. admitted on 01/23/2013 and found to have a diagnosis of END STAGE RIGHT KNEE OSTEOARTHRITIS.  After appropriate laboratory studies were obtained  they were taken to the operating room on 01/23/2013 and underwent  Procedure(s): RIGHT TOTAL KNEE ARTHROPLASTY  Right.   They were given perioperative antibiotics:  Anti-infectives   Start     Dose/Rate Route Frequency Ordered Stop   01/23/13 1400  ceFAZolin (ANCEF) IVPB 2 g/50 mL premix  2 g 100 mL/hr over 30 Minutes Intravenous Every 6 hours 01/23/13 1135 01/23/13 2056   01/23/13 0600  ceFAZolin (ANCEF) IVPB 2 g/50 mL premix     2 g 100 mL/hr over 30 Minutes Intravenous On call to O.R. 01/22/13 1447 01/23/13 0741    .  Tolerated the procedure well.  Placed with a foley intraoperatively.    Toradol was given post op.  POD #1, allowed out of bed to a chair.  PT for ambulation and exercise program.  Foley D/C'd in morning.  IV saline locked.  O2 discontionued. Hemovac pulled.  POD #2, continued PT and ambulation.  Had some sun downing but improved with drug changes. Marland Kitchen POD #3, difficulty during night with pain mgmt.  Foley place POD #2 and d/c'd POD #3.  The remainder of the hospital course was dedicated to ambulation  and strengthening.   The patient was discharged on 3 Days Post-Op in  Stable condition.  Blood products given:none  DIAGNOSTIC STUDIES: Recent vital signs:  Patient Vitals for the past 24 hrs:  BP Temp Pulse Resp SpO2 Weight  01/26/13 0559 150/52 mmHg 97.8 F (36.6 C) 76 16 100 % -  01/26/13 0500 - - - - - 82.5 kg (181 lb 14.1 oz)  01/26/13 0400 - - - 16 - -  01/26/13 0000 - - - 18 - -  01/25/13 2113 158/47 mmHg 97.8 F (36.6 C) 77 20 91 % -  01/25/13 2000 - - - 18 - -  01/25/13 1358 151/45 mmHg 99.1 F (37.3 C) 75 20 97 % -       Recent laboratory studies:  Recent Labs  01/23/13 1310 01/24/13 0313 01/25/13 0430 01/26/13 0540  WBC 10.6* 12.6* 14.6* 11.4*  HGB 10.3* 8.9* 9.5* 9.7*  HCT 32.7* 27.7* 29.5* 30.1*  PLT 302 242 245 180    Recent Labs  01/23/13 1310 01/24/13 0313 01/25/13 0430 01/26/13 0540  NA  --  135* 135* 134*  K  --  4.5 4.6 4.4  CL  --  95* 92* 93*  CO2  --  28 24 26   BUN  --  22 30* 32*  CREATININE 1.02 1.24* 1.57* 1.31*  GLUCOSE  --  184* 166* 199*  CALCIUM  --  8.4 8.6 8.5   Lab Results  Component Value Date   INR 2.21* 01/26/2013   INR 1.67* 01/25/2013   INR 1.19 01/24/2013     Recent Radiographic Studies :  Chest 2 View  01/16/2013   CLINICAL DATA:  Preop chest x-ray  EXAM: CHEST  2 VIEW  COMPARISON:  04/03/2012  FINDINGS: Bilateral mild interstitial thickening likely chronic. There is no focal parenchymal opacity, pleural effusion, or pneumothorax. Mild stable cardiomegaly. Prior CABG.  The osseous structures are unremarkable.  IMPRESSION: No active cardiopulmonary disease.   Electronically Signed   By: Elige Ko   On: 01/16/2013 14:12    DISCHARGE INSTRUCTIONS:     Discharge Orders   Future Appointments Provider Department Dept Phone   03/28/2013 10:00 AM York Spaniel, MD [facility] [phone]   Future Orders Complete By Expires   Call MD / Call 911  As directed    Comments:     If you experience chest  pain or shortness of breath, CALL 911 and be transported to the hospital emergency room.  If you develope a fever above 101 F, pus (white drainage) or increased drainage or redness at the wound, or calf pain, call your  surgeon's office.   Change dressing  As directed    Comments:     Change dressing on SUNDAY, then change the dressing daily with sterile 4 x 4 inch gauze dressing and apply TED hose.  You may clean the incision with alcohol prior to redressing.   Constipation Prevention  As directed    Comments:     Drink plenty of fluids.  Prune juice and/or coffee may be helpful.  You may use a stool softener, such as Colace (over the counter) 100 mg twice a day.  Use MiraLax (over the counter) for constipation as needed but this may take several days to work.  Mag Citrate --OR-- Milk of Magnesia --OR -- Dulcolax pills/suppositories may also be used but follow directions on the label.   CPM  As directed    Comments:     Continuous passive motion machine (CPM):      Use the CPM from 0 to 60 for 6-8 hours per day.      You may increase by 5-10 per day.  You may break it up into 2 or 3 sessions per day.      Use CPM for 3-4 weeks or until you are told to stop.   Diet general  As directed    Discharge instructions  As directed    Comments:     YOU WERE GIVEN A DEVICE CALLED AN INCENTIVE SPIROMETER TO HELP YOU TAKE DEEP BREATHS.  PLEASE USE THIS AT LEAST TEN (10) TIMES EVERY 1-2 HOURS EVERY DAY TO PREVENT PNEUMONIA.   Do not put a pillow under the knee. Place it under the heel.  As directed    Driving restrictions  As directed    Comments:     No driving for 6 weeks   Increase activity slowly as tolerated  As directed    Lifting restrictions  As directed    Comments:     No lifting for 6 weeks   Partial weight bearing  As directed    Comments:     50  % WEIGHT BEARING AS TAUGHT IN PHYSICAL THERAPY   Questions:     % Body Weight:     Laterality:     Extremity:     Patient may shower  As  directed    Comments:     You may shower over the brown dressing.  Once the dressing is removed you may shower without a dressing once there is no drainage.  Do not wash over the wound.  If drainage remains, cover wound with plastic wrap and then shower.   TED hose  As directed    Comments:     Use stockings (TED hose) for 2 weeks on operative leg(s).  You may remove them at night for sleeping.      DISCHARGE MEDICATIONS:     Medication List    STOP taking these medications       diclofenac sodium 1 % Gel  Commonly known as:  VOLTAREN     HYDROcodone-acetaminophen 7.5-325 MG per tablet  Commonly known as:  NORCO     methotrexate 2.5 MG tablet  Commonly known as:  RHEUMATREX     PENNSAID 1.5 % Soln  Generic drug:  Diclofenac Sodium     temazepam 30 MG capsule  Commonly known as:  RESTORIL      TAKE these medications       acetaminophen 325 MG tablet  Commonly known as:  TYLENOL  Take 2 tablets (650  mg total) by mouth every 6 (six) hours.     atorvastatin 40 MG tablet  Commonly known as:  LIPITOR  Take 40 mg by mouth daily.     bisacodyl 10 MG suppository  Commonly known as:  DULCOLAX  Place 1 suppository (10 mg total) rectally daily as needed for moderate constipation.     calcium carbonate 600 MG Tabs tablet  Commonly known as:  OS-CAL  Take 600 mg by mouth 2 (two) times daily.     clopidogrel 75 MG tablet  Commonly known as:  PLAVIX  Take 1 tablet (75 mg total) by mouth daily with breakfast.     DSS 100 MG Caps  Take 100 mg by mouth 2 (two) times daily.     fenofibrate 160 MG tablet  Take 160 mg by mouth daily.     folic acid 400 MCG tablet  Commonly known as:  FOLVITE  Take 400 mcg by mouth daily.     furosemide 40 MG tablet  Commonly known as:  LASIX  Take 1 tablet (40 mg total) by mouth 2 (two) times daily.     gabapentin 300 MG capsule  Commonly known as:  NEURONTIN  Take 300 mg by mouth 3 (three) times daily.     insulin glargine 100  UNIT/ML injection  Commonly known as:  LANTUS  Inject 0.6 mLs (60 Units total) into the skin daily with breakfast.     insulin glargine 100 UNIT/ML injection  Commonly known as:  LANTUS  Inject 0.6 mLs (60 Units total) into the skin daily.     insulin lispro 100 UNIT/ML injection  Commonly known as:  HUMALOG  Inject 7 Units into the skin 3 (three) times daily with meals.     losartan 100 MG tablet  Commonly known as:  COZAAR  Take 100 mg by mouth daily.     metoprolol succinate 25 MG 24 hr tablet  Commonly known as:  TOPROL-XL  Take 1 tablet (25 mg total) by mouth 2 (two) times daily.     multivitamin capsule  Take 1 capsule by mouth daily.     nitroGLYCERIN 0.4 MG SL tablet  Commonly known as:  NITROSTAT  Place 0.4 mg under the tongue every 5 (five) minutes as needed for chest pain.     potassium chloride SA 20 MEQ tablet  Commonly known as:  K-DUR,KLOR-CON  Take 20 mEq by mouth every Monday, Wednesday, and Friday.     traMADol 50 MG tablet  Commonly known as:  ULTRAM  Take 0.5 tablets (25 mg total) by mouth every 6 (six) hours as needed for moderate pain or severe pain.     vitamin B-12 1000 MCG tablet  Commonly known as:  CYANOCOBALAMIN  Take 1,000 mcg by mouth 2 (two) times daily.     warfarin 5 MG tablet  Commonly known as:  COUMADIN  Take 5 mg by mouth daily.        FOLLOW UP VISIT:   Follow-up Information   Follow up with [facility], Claude Manges, MD. Schedule an appointment as soon as possible for a visit on 02/05/2013.   Specialty:  Orthopedic Surgery   Contact information:   [address] [place] [address] [phone]       DISPOSITION:   Skilled Nursing Facility/Rehab  CONDITION:  Stable   PETRARCA,BRIAN 01/26/2013, 8:13 AM

## 2013-01-25 NOTE — Progress Notes (Signed)
Physical Therapy Treatment Patient Details Name: [name] MRN: [id number] DOB: July 03, 1940 Today's Date: 01/25/2013 Time: 5170-0174 PT Time Calculation (min): 18 min  PT Assessment / Plan / Recommendation  History of Present Illness Pt is a 73 y/o female admitted s/p R TKA.   PT Comments   Pt very lethargic today & required increased assist for all mobility.  Only performed bed>recliner transfer due to pt unsafe to attempt ambulation at this time.     Follow Up Recommendations  SNF     Does the patient have the potential to tolerate intense rehabilitation     Barriers to Discharge        Equipment Recommendations       Recommendations for Other Services    Frequency 7X/week   Progress towards PT Goals Progress towards PT goals: Not progressing toward goals - comment (due to lethargy)  Plan Current plan remains appropriate    Precautions / Restrictions Precautions Precautions: Fall;Knee Restrictions RLE Weight Bearing: Partial weight bearing RLE Partial Weight Bearing Percentage or Pounds: 50       Mobility  Bed Mobility Overal bed mobility: Needs Assistance Bed Mobility: Supine to Sit Supine to sit: Mod assist;HOB elevated General bed mobility comments: Pt very drowsy.  HOB elevated.  Max directional cues.  (A) to lift shoulders/trunk to sitting upright & use of draw pad to pivot hips around to EOB & scoot closer to EOB>   Transfers Overall transfer level: Needs assistance Transfers: Sit to/from Stand;Stand Pivot Transfers Sit to Stand: Mod assist Stand pivot transfers: Mod assist General transfer comment: max directional cues & to keep eyes open.  Pt remained in flexed posture & had significant difficulty moving LLE.  (A) to achieve standing, balance, placement of hands on RW, rotation of hips, & controlled descent.       PT Goals (current goals can now be found in the care plan section) Acute Rehab PT Goals Patient Stated Goal: To return home with husband  after rehab PT Goal Formulation: With patient Time For Goal Achievement: 02/06/13 Potential to Achieve Goals: Good  Visit Information  Last PT Received On: 01/25/13 Assistance Needed: +1 History of Present Illness: Pt is a 73 y/o female admitted s/p R TKA.    Subjective Data  Patient Stated Goal: To return home with husband after rehab   Cognition  Cognition Arousal/Alertness: Lethargic;Suspect due to medications Overall Cognitive Status: Impaired/Different from baseline Area of Impairment: Following commands;Safety/judgement;Attention;Problem solving Current Attention Level: Sustained Following Commands: Follows one step commands with increased time Problem Solving: Slow processing;Decreased initiation;Difficulty sequencing;Requires verbal cues;Requires tactile cues General Comments: Pt very drowsy/lethargic.  Keeping eyes closed majority of PT session.      Balance     End of Session PT - End of Session Activity Tolerance: Patient limited by lethargy Patient left: in chair;with call bell/phone within reach;with family/visitor present Nurse Communication: Mobility status CPM Right Knee CPM Right Knee: On   GP     Lara Mulch 01/25/2013, 11:54 AM   Verdell Face, PTA [phone] 01/25/2013

## 2013-01-25 NOTE — Clinical Documentation Improvement (Signed)
THIS DOCUMENT IS NOT A PERMANENT PART OF THE MEDICAL RECORD  Please update your documentation with the medical record to reflect your response to this query. If you need help knowing how to do this please call [phone].  01/25/13   Dear Ronie Spies PA,   Note 1/7 states: "Acute renal insufficiency - consider holding PM Lasix dose and following". In the Coding world "acute renal insufficiency" is nonspecific and low weighted. Please clarify this term to show severity of illness and risk of mortality. Thank you.  Possible Clinical Conditions? - AKI  - Acute renal failure  - Other Condition (please specify)   Supporting Information: - BUN/Cr:  1/6: no result/1.02,  1/7: 22/1.24,  1/8: 30/1.57 - GFR:  1/6: 54/62,  1/7: 42/49,  1/8: 32/37   You may use possible, probable, or suspect with inpatient documentation. possible, probable, suspected diagnoses MUST be documented at the time of discharge  Reviewed: additional documentation in the medical record  Thank You,  Beverley Fiedler  Clinical Documentation Specialist: [phone] Health Information Management Advance

## 2013-01-25 NOTE — Progress Notes (Signed)
At beginning of shift pt very drowsy. Day RN reported that after pt had received Robaxin she became drowsy and "loopy". Pt had 2 doses of Robaxin during the day. Robaxin was the only medication different than what she received during the previous night. A&Ox4, doses off during conversation. States she is in pain "all over". Unable to give exact description of where pain is. When questioning pt on specific origins of pain she stated the only pain was her knee. Robaxin not given to pt again. She has tolerated oxycodone well with no adverse effects. Pt very uncomfortable in bed, states she sleeps in recliner at home. Assisted pt from bed to chair multiple times in attempt to get her comfortable. Pt currently in recliner with legs elevated, R knee straight, sleeping. Pt is more alert than previously and resting comfortably. Family at bedside.  Pt currently is due to void. Second I&O cath done at 2230 with of amber, clear urine out. Encouraged pt to increase fluid intake, pt understood and began drinking more water. Will continue to monitor.

## 2013-01-25 NOTE — Progress Notes (Signed)
ANTICOAGULATION CONSULT NOTE - Follow Up Consult  Pharmacy Consult for coumadin Indication: mechanical AVR  Allergies  Allergen Reactions  . Shellfish Allergy Anaphylaxis    Not allergic to betadine  . Codeine Other (See Comments)    Feels sick    Patient Measurements: Height: 5\' 2"  (157.5 cm) Weight: 183 lb 3.2 oz (83.1 kg) IBW/kg (Calculated) : 50.1   Vital Signs: Temp: 98.6 F (37 C) (01/08 0552) Temp src: Oral (01/08 0552) BP: 153/49 mmHg (01/08 0552) Pulse Rate: 71 (01/08 0552)  Labs:  Recent Labs  01/23/13 0555  01/23/13 1310 01/24/13 0313 01/25/13 0430  HGB  --   < > 10.3* 8.9* 9.5*  HCT  --   --  32.7* 27.7* 29.5*  PLT  --   --  302 242 245  APTT 26  --   --   --   --   LABPROT 13.9  --   --  14.8 19.2*  INR 1.09  --   --  1.19 1.67*  CREATININE  --   --  1.02 1.24* 1.57*  < > = values in this interval not displayed.  Estimated Creatinine Clearance: 32.4 ml/min (by C-G formula based on Cr of 1.57).  Assessment: Patient is a 73 y.o F s/p TKR and was on coumadin PTA for mechanical AVR.  INR is subtherapeutic today but increased noticeably from 1.19 to 1.67.  CBC stable, no bleeding documented  Goal of Therapy:  INR 2.5-3.5 (per documentation from Anticoag clinic)    Plan:  1) coumadin 2.5mg  PO x1 today 2) Notify pharmacy if heparin is needed for bridging  Abbagale Goguen P 01/25/2013,8:13 AM

## 2013-01-25 NOTE — Progress Notes (Signed)
This note serves to clarify coding documentation of acute renal insufficiency to AKI on the 1/7 rounding note. Dayna Dunn PA-C

## 2013-01-25 NOTE — Clinical Social Work Placement (Addendum)
Clinical Social Work Department  CLINICAL SOCIAL WORK PLACEMENT NOTE  Patient: ANICE WILSHIRE Account Number: [id number] Admit date: 01/24/12  Clinical Social Worker: Sabino Niemann LCSWA Date/time: 01/25/2013 11:30 AM  Clinical Social Work is seeking post-discharge placement for this patient at the following level of care: SKILLED NURSING (*CSW will update this form in Epic as items are completed)  01/25/2013 Patient/family provided with [facility] Department of Clinical Social Work's list of facilities offering this level of care within the geographic area requested by the patient (or if unable, by the patient's family).  01/25/2013 Patient/family informed of their freedom to choose among providers that offer the needed level of care, that participate in Medicare, Medicaid or managed care program needed by the patient, have an available bed and are willing to accept the patient.  1/8/2015Patient/family informed of [facility]' ownership interest in [facility], as well as of the fact that they are under no obligation to receive care at this facility.  PASARR submitted to EDS on 01/26/2013 PASARR number received from EDS on 01/26/2013 FL2 transmitted to all facilities in geographic area requested by pt/family on 01/25/2013  FL2 transmitted to all facilities within larger geographic area on  Patient informed that his/her managed care company has contracts with or will negotiate with certain facilities, including the following:  Patient/family informed of bed offers received:  Patient chooses bed at [facility] Physician recommends and patient chooses bed at  Patient to be transferred to [facility] on 01/27/13- Jetta Lout, LCSWA  Patient to be transferred to facility by EMS (PTAR)- Jetta Lout, LCSWA    The following physician request were entered in Epic:  Additional Comments:

## 2013-01-26 DIAGNOSIS — N179 Acute kidney failure, unspecified: Secondary | ICD-10-CM

## 2013-01-26 DIAGNOSIS — I2510 Atherosclerotic heart disease of native coronary artery without angina pectoris: Secondary | ICD-10-CM

## 2013-01-26 DIAGNOSIS — J810 Acute pulmonary edema: Secondary | ICD-10-CM

## 2013-01-26 DIAGNOSIS — I70219 Atherosclerosis of native arteries of extremities with intermittent claudication, unspecified extremity: Secondary | ICD-10-CM

## 2013-01-26 LAB — URINE MICROSCOPIC-ADD ON

## 2013-01-26 LAB — BASIC METABOLIC PANEL
BUN: 32 mg/dL — ABNORMAL HIGH (ref 6–23)
CALCIUM: 8.5 mg/dL (ref 8.4–10.5)
CO2: 26 mEq/L (ref 19–32)
CREATININE: 1.31 mg/dL — AB (ref 0.50–1.10)
Chloride: 93 mEq/L — ABNORMAL LOW (ref 96–112)
GFR calc Af Amer: 46 mL/min — ABNORMAL LOW (ref >90)
GFR calc non Af Amer: 40 mL/min — ABNORMAL LOW (ref >90)
GLUCOSE: 199 mg/dL — AB (ref 70–99)
Potassium: 4.4 mEq/L (ref 3.7–5.3)
Sodium: 134 mEq/L — ABNORMAL LOW (ref 137–147)

## 2013-01-26 LAB — URINALYSIS, ROUTINE W REFLEX MICROSCOPIC
BILIRUBIN URINE: NEGATIVE
Glucose, UA: 250 mg/dL — AB
Ketones, ur: NEGATIVE mg/dL
Nitrite: NEGATIVE
Protein, ur: 30 mg/dL — AB
SPECIFIC GRAVITY, URINE: 1.017 (ref 1.005–1.030)
Urobilinogen, UA: 1 mg/dL (ref 0.0–1.0)
pH: 5 (ref 5.0–8.0)

## 2013-01-26 LAB — CBC
HCT: 30.1 % — ABNORMAL LOW (ref 36.0–46.0)
HEMOGLOBIN: 9.7 g/dL — AB (ref 12.0–15.0)
MCH: 32.1 pg (ref 26.0–34.0)
MCHC: 32.2 g/dL (ref 30.0–36.0)
MCV: 99.7 fL (ref 78.0–100.0)
Platelets: 180 10*3/uL (ref 150–400)
RBC: 3.02 MIL/uL — ABNORMAL LOW (ref 3.87–5.11)
RDW: 15.2 % (ref 11.5–15.5)
WBC: 11.4 10*3/uL — ABNORMAL HIGH (ref 4.0–10.5)

## 2013-01-26 LAB — GLUCOSE, CAPILLARY
GLUCOSE-CAPILLARY: 383 mg/dL — AB (ref 70–99)
GLUCOSE-CAPILLARY: 303 mg/dL — AB (ref 70–99)
Glucose-Capillary: 181 mg/dL — ABNORMAL HIGH (ref 70–99)
Glucose-Capillary: 367 mg/dL — ABNORMAL HIGH (ref 70–99)
Glucose-Capillary: 171 mg/dL — ABNORMAL HIGH (ref 70–99)

## 2013-01-26 LAB — PROTIME-INR
INR: 2.21 — ABNORMAL HIGH (ref 0.00–1.49)
Prothrombin Time: 23.8 seconds — ABNORMAL HIGH (ref 11.6–15.2)

## 2013-01-26 MED ORDER — WARFARIN SODIUM 2 MG PO TABS
2.0000 mg | ORAL_TABLET | Freq: Once | ORAL | Status: AC
  Administered 2013-01-26: 2 mg via ORAL
  Filled 2013-01-26 (×2): qty 1

## 2013-01-26 MED ORDER — HYDROCODONE-ACETAMINOPHEN 7.5-325 MG PO TABS: 1.0000 | ORAL_TABLET | ORAL | Status: DC | PRN

## 2013-01-26 MED ORDER — CEPHALEXIN 500 MG PO CAPS
500.0000 mg | ORAL_CAPSULE | Freq: Three times a day (TID) | ORAL | Status: DC
  Administered 2013-01-26 – 2013-01-27 (×3): 500 mg via ORAL
  Filled 2013-01-26 (×6): qty 1

## 2013-01-26 MED ORDER — HYDROCODONE-ACETAMINOPHEN 7.5-325 MG PO TABS
1.0000 | ORAL_TABLET | ORAL | Status: DC | PRN
  Administered 2013-01-26: 1 via ORAL
  Filled 2013-01-26: qty 1

## 2013-01-26 MED ORDER — HYDROCODONE-ACETAMINOPHEN 7.5-325 MG PO TABS
1.0000 | ORAL_TABLET | ORAL | Status: DC | PRN
  Administered 2013-01-26: 2 via ORAL
  Administered 2013-01-26: 1 via ORAL
  Administered 2013-01-26: 2 via ORAL
  Administered 2013-01-27: 1 via ORAL
  Administered 2013-01-27: 2 via ORAL
  Filled 2013-01-26 (×2): qty 2
  Filled 2013-01-26 (×2): qty 1
  Filled 2013-01-26: qty 2

## 2013-01-26 NOTE — Progress Notes (Signed)
Occupational Therapy Treatment Patient Details Name: ADALEY KIENE MRN: [id number] DOB: 11/07/1940 Today's Date: 01/26/2013 Time: 5790-3833 OT Time Calculation (min): 21 min  OT Assessment / Plan / Recommendation  History of present illness Pt is a 73 y/o female admitted s/p R TKA.   OT comments  Grimacing, moaning, and yelling throughout session secondary to c/o severe R knee pain.  Assisted with sim. Toilet transfer for pt. oob to recliner.  Requires inst. Cues throughout session for ue use and tech. For pivotal steps and transitioning from sit/stand, stand/sit.  All aspects of mobility min/mod a.   Follow Up Recommendations  SNF                  Recommendations for Other Services    Frequency Min 2X/week   Progress towards OT Goals Progress towards OT goals: Progressing toward goals  Plan Discharge plan remains appropriate    Precautions / Restrictions Precautions Precautions: Knee;Fall   Pertinent Vitals/Pain 5/10 before movement, 8/10 with movement-rn notified and will assist with ice pack placement    ADL  Toilet Transfer: Simulated;Minimal assistance Toilet Transfer Method: Sit to stand;Stand pivot Toileting - Clothing Manipulation and Hygiene: Simulated;Minimal assistance Transfers/Ambulation Related to ADLs: min a for functional mobility, inst. cues for safety, sequencing and hand placement.  max cues to keep eyes open as she tends to close them when feeling pain ADL Comments: sim. toileting with pivotal steps to recliner min a with mod cues for task completion.  dtr present and was active throughout session.         OT Goals(current goals can now be found in the care plan section)    Visit Information  Last OT Received On: 01/26/13 History of Present Illness: Pt is a 73 y/o female admitted s/p R TKA.                 Cognition  Cognition Arousal/Alertness: Awake/alert Behavior During Therapy: WFL for tasks assessed/performed Overall Cognitive Status:  Impaired/Different from baseline Area of Impairment: Safety/judgement;Following commands;Problem solving;Attention Current Attention Level: Sustained Following Commands: Follows one step commands with increased time Problem Solving: Slow processing;Decreased initiation;Difficulty sequencing;Requires verbal cues;Requires tactile cues    Mobility  Bed Mobility General bed mobility comments: mod a to bring RLE out of bed with increased time as pt. c/o pain with any movement Transfers Overall transfer level: Needs assistance Equipment used: Rolling walker (2 wheeled) Transfers: Sit to/from [employer] Sit to Stand: Min assist Stand pivot transfers: Min assist General transfer comment: cues for pushing through waker with b ues while transitioning from sit/stand, also cues for reaching for arm rests, sliding le forward and maintaining controled descent               End of Session OT - End of Session Equipment Utilized During Treatment: Rolling walker Activity Tolerance: Patient tolerated treatment well Patient left: in chair;with call bell/phone within reach;with family/visitor present Nurse Communication: Other (comment) (need for ice pack)       Susanne Baumgarner, Earvin Hansen, COTA/L 01/26/2013, 8:25 AM

## 2013-01-26 NOTE — Clinical Social Work Psychosocial (Signed)
Clinical Social Work Department  BRIEF PSYCHOSOCIAL ASSESSMENT  Patient: [name]  Account Number: [id number]   Admit date:  01/23/2013 Clinical Social Worker Rhea Pink, MSW Date/Time: 01/26/12 Referred by: Physician Date Referred: 01/24/12 Referred for   SNF Placement   Other Referral:  Interview type: Patient  Other interview type: PSYCHOSOCIAL DATA  Living Status:Husband Admitted from facility:  Level of care:  Primary support name: [name] Primary support relationship to patient: Husband Degree of support available:  Strong and vested  CURRENT CONCERNS  Current Concerns   Post-Acute Placement   Other Concerns:  SOCIAL WORK ASSESSMENT / PLAN  CSW met with pt re: PT recommendation for SNF.   Pt lives with her husband  CSW explained placement process and answered questions.   Pt reports [name]  as her preference    CSW completed FL2 and initiated SNF search.     Assessment/plan status: Information/Referral to [employer]  Other assessment/ plan:  Information/referral to community resources:  SNF   PTAR  PATIENT'S/FAMILY'S RESPONSE TO PLAN OF CARE:  Pt  reports she is agreeable to ST SNF in order to increase strength and independence with mobility prior to returning home  Pt verbalized understanding of placement process and appreciation for CSW assist.   Rhea Pink, MSW, [place]

## 2013-01-26 NOTE — Progress Notes (Signed)
Patient Name: [name] Date of Encounter: 01/26/2013   Principal Problem:   Osteoarthritis of right knee Active Problems:   S/P total knee replacement   CAD, CABG 1991, redo 2006, S/P DES to Left Main 04/01/12   H/O mechanical aortic valve replacement, St Jude 1991   Chronic anticoagulation with Coumadin   Cardiomyopathy, ischemic, EF 35-40% 2D 03/31/12   Type 2 diabetes mellitus, uncontrolled   Rheumatoid arthritis- chronic steroids   History of GI bleed/2012   HTN (hypertension)   SUBJECTIVE  Pt c/o severe right knee pain.  No chest pain or sob.  CURRENT MEDS . atorvastatin  40 mg Oral q1800  . calcium carbonate  1 tablet Oral BID WC  . clopidogrel  75 mg Oral Q breakfast  . docusate sodium  100 mg Oral BID  . fenofibrate  160 mg Oral Daily  . fenofibrate  160 mg Oral Daily  . folic acid  0.5 mg Oral Daily  . gabapentin  300 mg Oral TID  . insulin aspart  0-5 Units Subcutaneous QHS  . insulin aspart  0-9 Units Subcutaneous TID WC  . insulin aspart  7 Units Subcutaneous TID WC  . insulin glargine  60 Units Subcutaneous Daily  . metoprolol succinate  25 mg Oral BID  . multivitamin with minerals  1 tablet Oral Daily  . pantoprazole  40 mg Oral Q0600  . potassium chloride SA  20 mEq Oral Q M,W,F  . Warfarin - Pharmacist Dosing Inpatient   Does not apply q1800    OBJECTIVE  Filed Vitals:   01/26/13 0000 01/26/13 0400 01/26/13 0500 01/26/13 0559  BP:    150/52  Pulse:    76  Temp:    97.8 F (36.6 C)  TempSrc:      Resp: 18 16  16   Height:      Weight:   181 lb 14.1 oz (82.5 kg)   SpO2:    100%    Intake/Output Summary (Last 24 hours) at 01/26/13 1029 Last data filed at 01/26/13 0829  Gross per 24 hour  Intake      0 ml  Output   1900 ml  Net  -1900 ml   Filed Weights   01/24/13 0000 01/25/13 0650 01/26/13 0500  Weight: 175 lb (79.379 kg) 183 lb 3.2 oz (83.1 kg) 181 lb 14.1 oz (82.5 kg)    PHYSICAL EXAM  General: Pleasant, visibly in pain w/o  distress. Neuro: Alert and oriented X 3. Moves all extremities spontaneously. Psych: Normal affect. HEENT:  Normal  Neck: Supple without bruits or JVD. Lungs:  Resp regular and unlabored, CTA. Heart: RRR no s3, s4, or murmurs. Abdomen: Soft, non-tender, non-distended, BS + x 4.  Extremities: No clubbing, cyanosis.  Trace r LE edema and tenderness. DP/PT/Radials 1+ and equal bilaterally.  Accessory Clinical Findings  CBC  Recent Labs  01/25/13 0430 01/26/13 0540  WBC 14.6* 11.4*  HGB 9.5* 9.7*  HCT 29.5* 30.1*  MCV 99.7 99.7  PLT 245 180   Basic Metabolic Panel  Recent Labs  01/25/13 0430 01/26/13 0540  NA 135* 134*  K 4.6 4.4  CL 92* 93*  CO2 24 26  GLUCOSE 166* 199*  BUN 30* 32*  CREATININE 1.57* 1.31*  CALCIUM 8.6 8.5   Hemoglobin A1C  Recent Labs  01/23/13 1310  HGBA1C 8.9*   Lab Results  Component Value Date   INR 2.21* 01/26/2013   INR 1.67* 01/25/2013   INR 1.19 01/24/2013  TELE  Rsr, occas pvc's.  Radiology/Studies  Chest 2 View  01/16/2013   CLINICAL DATA:  Preop chest x-ray  EXAM: CHEST  2 VIEW  COMPARISON:  04/03/2012  FINDINGS: Bilateral mild interstitial thickening likely chronic. There is no focal parenchymal opacity, pleural effusion, or pneumothorax. Mild stable cardiomegaly. Prior CABG.  The osseous structures are unremarkable.  IMPRESSION: No active cardiopulmonary disease.   Electronically Signed   By: Elige Ko   On: 01/16/2013 14:12   ASSESSMENT AND PLAN  1. OA s/p R TKA - complains of significant pain; management per orthopedic surgery.   2. Coronary disease s/p CABG 1991, redo 2006, s/p DES to [facility] 04/01/12. Patient is asymptomatic. She is hemodynamically stable. Cont statin/plavix/bb. No ASA in setting of plavix/coumadin.  3. S/p St. Jude Mechnical AVR - Coumadin resumed and INR therapeutic this morning.  4. Hypertension - BP elevated this AM in setting of ongoing pain.  Continue BB.  Pain mgmt.  ARB on hold in setting of mild  renal insuff, which is improving.  5. Diabetes mellitus type 2, uncontrolled A1C 8.9 - per primary team.  ARB on hold.  6. Hypercholesterolemia - continue statin.   7. ICM EF 35-40% by echo 03/2012, hx cardiac arrest at that time with torsades - stable, euvolemic. Continue bb.  ARB on hold in setting of elevated creat.  Now trending down - 1.31.  Follow and resume ARB when appropriate.  8. ABL anemia - likely related to surgery. Stable.  9. Acute kidney injury - ARB/lasix remain on hold.  Creat sl better.  Cont to follow and resume as renal function stabilizes.   Signed, Nicolasa Ducking NP As above; patient seen and examined; no chest pain or dyspnea; continue plavix and coumadin; Renal function slightly better; would resume ARB and lasix later as renal function normalizes. Olga Millers

## 2013-01-26 NOTE — Progress Notes (Signed)
Patient ID: [name], female   DOB: 23-Mar-1940, 73 y.o.   MRN: [id number] PATIENT ID: [name]        MRN:  [id number]          DOB/AGE: 1940/03/12 / 73 y.o.    Laura Campbell, MD   [name], PA-C [address] [place], [place]  [address]                             [phone]   PROGRESS NOTE  Subjective:  negative for Chest Pain  negative for Shortness of Breath  negative for Nausea/Vomiting   negative for Calf Pain    Tolerating Diet: yes         Patient reports pain as moderate and severe.     Had a difficult time with pain management last night.  Tramadol short time  Benefit only.  Spent most of the night in recliner   Objective: Vital signs in last 24 hours:   Patient Vitals for the past 24 hrs:  BP Temp Pulse Resp SpO2 Weight  01/26/13 0559 150/52 mmHg 97.8 F (36.6 C) 76 16 100 % -  01/26/13 0500 - - - - - 82.5 kg (181 lb 14.1 oz)  01/26/13 0400 - - - 16 - -  01/26/13 0000 - - - 18 - -  01/25/13 2113 158/47 mmHg 97.8 F (36.6 C) 77 20 91 % -  01/25/13 2000 - - - 18 - -  01/25/13 1358 151/45 mmHg 99.1 F (37.3 C) 75 20 97 % -      Intake/Output from previous day:   01/08 0701 - 01/09 0700 In: -  Out: 1550 [Urine:1550]   Intake/Output this shift:       Intake/Output     01/08 0701 - 01/09 0700 01/09 0701 - 01/10 0700   P.O.     I.V. (mL/kg)     Total Intake(mL/kg)     Urine (mL/kg/hr) 1550 (0.8)    Total Output 1550     Net -1550             LABORATORY DATA:  Recent Labs  01/23/13 1310 01/24/13 0313 01/25/13 0430 01/26/13 0540  WBC 10.6* 12.6* 14.6* 11.4*  HGB 10.3* 8.9* 9.5* 9.7*  HCT 32.7* 27.7* 29.5* 30.1*  PLT 302 242 245 180    Recent Labs  01/23/13 1310 01/24/13 0313 01/25/13 0430 01/26/13 0540  NA  --  135* 135* 134*  K  --  4.5 4.6 4.4  CL  --  95* 92* 93*  CO2  --  28 24 26   BUN  --  22 30* 32*  CREATININE 1.02 1.24* 1.57* 1.31*  GLUCOSE  --  184* 166* 199*  CALCIUM  --  8.4 8.6 8.5   Lab  Results  Component Value Date   INR 2.21* 01/26/2013   INR 1.67* 01/25/2013   INR 1.19 01/24/2013    Recent Radiographic Studies :  Chest 2 View  01/16/2013   CLINICAL DATA:  Preop chest x-ray  EXAM: CHEST  2 VIEW  COMPARISON:  04/03/2012  FINDINGS: Bilateral mild interstitial thickening likely chronic. There is no focal parenchymal opacity, pleural effusion, or pneumothorax. Mild stable cardiomegaly. Prior CABG.  The osseous structures are unremarkable.  IMPRESSION: No active cardiopulmonary disease.   Electronically Signed   By: 04/05/2012   On: 01/16/2013 14:12     Examination:  General appearance: alert,  moderate distress and moderately obese  Wound Exam: clean, dry, intact   Drainage:  None: wound tissue dry  Motor Exam: EHL, FHL, Anterior Tibial and Posterior Tibial Intact  Sensory Exam: Superficial Peroneal, Deep Peroneal and Tibial normal  Vascular Exam: Right dorsalis pedis artery has trace pulse  Assessment:    3 Days Post-Op  Procedure(s) (LRB): RIGHT TOTAL KNEE ARTHROPLASTY (Right)  ADDITIONAL DIAGNOSIS:  Principal Problem:   Osteoarthritis of right knee Active Problems:   History of GI bleed/2012   CAD, CABG 1991, redo 2006, S/P DES to Left Main 04/01/12   H/O mechanical aortic valve replacement, St Jude 1991   Chronic anticoagulation with Coumadin   HTN (hypertension)   Cardiomyopathy, ischemic, EF 35-40% 2D 03/31/12   Type 2 diabetes mellitus, uncontrolled   Cardiac arrest 04/01/12   Rheumatoid arthritis- chronic steroids   S/P total knee replacement   Left ventricular diastolic dysfunction, NYHA class 2  Acute Blood Loss Anemia and Renal Insufficiency Chronic   Plan: Physical Therapy as ordered Partial Weight Bearing @ 50% (PWB)  DVT Prophylaxis:  Coumadin and plavix  DISCHARGE PLAN: Skilled Nursing Facility/Rehab if new pain regimen works   DISCHARGE NEEDS: HHPT, CPM, Walker and 3-in-1 comode seat         Chayse Gracey 01/26/2013 8:03 AM

## 2013-01-26 NOTE — Progress Notes (Signed)
Patient ID: [name]  female  MLY:[id number]    DOB: 1940/07/05    DOA: 01/23/2013  PCP: Londell Moh, MD  Assessment/Plan: Active Problems:  S/P total knee replacement  -per orthopedics team  - Patient is having significant pain issues today, daughter at the bedside, states that the pain medications are not working. Also reports that she had difficult time whole night with pain, sitting in the recliner, moaning.  HTN (hypertension)  - Somewhat elevated due to pain, continue Toprol-XL, placed on hydralazine as needed, hold Lasix and Cozaar due to worsened creatinine  Type 2 diabetes mellitus: Somewhat elevated due to stress, surgery, pain, patient is erratically eating - Continue Lantus 60 units in a.m., recommended the nurse to give her 10 units of NovoLog now  - Continue NovoLog 7 units TID AC if she eats more than 50%, sliding scale  Rheumatoid arthritis- chronic immunosuppression  -on Rheumatrex pre admit but will defer to orthopedics re: resume since acutely post op   History of GI bleed/2012  -PPI for stress ulcer prevention especially with use of IV Toradol   H/O mechanical aortic valve replacement/Chronic anticoagulation (Coumadin)  - Coumadin restarted, per cardiology Dyslipidemia  CAD, CABG 1991, redo 2006, S/P DES to Left Main 04/01/12 post cardiac arrest  -per Cards , restarted on Plavix Cardiomyopathy, ischemic, EF 35-40% 2D 03/31/12/Left ventricular diastolic dysfunction, NYHA class 2  -per Cards- appears compensated   DVT Prophylaxis: On prophylactic Lovenox, Coumadin  Code Status:  Family Communication:   Disposition: PER Primary team    Subjective: Moaning with pain, daughter at the bedside, states having difficult time working with physical therapy due to pain   Objective: Weight change: -0.6 kg (-1 lb 5.2 oz)  Intake/Output Summary (Last 24 hours) at 01/26/13 1211 Last data filed at 01/26/13 0829  Gross per 24 hour  Intake      0 ml   Output   1900 ml  Net  -1900 ml   Blood pressure 144/49, pulse 69, temperature 97.8 F (36.6 C), temperature source Oral, resp. rate 16, height 5\' 2"  (1.575 m), weight 82.5 kg (181 lb 14.1 oz), SpO2 100.00%.  Physical Exam: General: Moaning and groaning with pain CVS: S1-S2 clear, no murmur rubs or gallops Chest:CTAB Abdomen: soft NT, ND, NBS  Extremities: no c/C/E Bilaterally   Lab Results: Basic Metabolic Panel:  Recent Labs Lab 01/25/13 0430 01/26/13 0540  NA 135* 134*  K 4.6 4.4  CL 92* 93*  CO2 24 26  GLUCOSE 166* 199*  BUN 30* 32*  CREATININE 1.57* 1.31*  CALCIUM 8.6 8.5   Liver Function Tests: No results found for this basename: AST, ALT, ALKPHOS, BILITOT, PROT, ALBUMIN,  in the last 168 hours No results found for this basename: LIPASE, AMYLASE,  in the last 168 hours No results found for this basename: AMMONIA,  in the last 168 hours CBC:  Recent Labs Lab 01/25/13 0430 01/26/13 0540  WBC 14.6* 11.4*  HGB 9.5* 9.7*  HCT 29.5* 30.1*  MCV 99.7 99.7  PLT 245 180   Cardiac Enzymes: No results found for this basename: CKTOTAL, CKMB, CKMBINDEX, TROPONINI,  in the last 168 hours BNP: No components found with this basename: POCBNP,  CBG:  Recent Labs Lab 01/25/13 1655 01/25/13 2126 01/26/13 0633 01/26/13 1037 01/26/13 1203  GLUCAP 277* 367* 181* 383* 367*     Micro Results: No results found for this or any previous visit (from the past 240 hour(s)).  Studies/Results: Chest 2 View  01/16/2013   CLINICAL DATA:  Preop chest x-ray  EXAM: CHEST  2 VIEW  COMPARISON:  04/03/2012  FINDINGS: Bilateral mild interstitial thickening likely chronic. There is no focal parenchymal opacity, pleural effusion, or pneumothorax. Mild stable cardiomegaly. Prior CABG.  The osseous structures are unremarkable.  IMPRESSION: No active cardiopulmonary disease.   Electronically Signed   By: Elige Ko   On: 01/16/2013 14:12    Medications: Scheduled Meds: .  atorvastatin  40 mg Oral q1800  . calcium carbonate  1 tablet Oral BID WC  . cephALEXin  500 mg Oral Q8H  . clopidogrel  75 mg Oral Q breakfast  . docusate sodium  100 mg Oral BID  . fenofibrate  160 mg Oral Daily  . fenofibrate  160 mg Oral Daily  . folic acid  0.5 mg Oral Daily  . gabapentin  300 mg Oral TID  . insulin aspart  0-5 Units Subcutaneous QHS  . insulin aspart  0-9 Units Subcutaneous TID WC  . insulin aspart  7 Units Subcutaneous TID WC  . insulin glargine  60 Units Subcutaneous Daily  . metoprolol succinate  25 mg Oral BID  . multivitamin with minerals  1 tablet Oral Daily  . pantoprazole  40 mg Oral Q0600  . potassium chloride SA  20 mEq Oral Q M,W,F  . Warfarin - Pharmacist Dosing Inpatient   Does not apply q1800      LOS: 3 days   Sindhu Nguyen M.D. [facility] 01/26/2013, 12:11 PM Pager: [phone]  If 7PM-7AM, please contact night-coverage [email or web address] Password [email or web address]

## 2013-01-26 NOTE — Progress Notes (Signed)
Orthopedic Tech Progress Note Patient Details:  [name] 17-Nov-1940 [id number]  Patient ID: [name], female   DOB: Aug 07, 1940, 73 y.o.   MRN: [id number] Placed pt's rle in cpm @0 -40 degrees @1745   01/26/2013, 7:44 PM

## 2013-01-26 NOTE — Progress Notes (Signed)
Physical Therapy Treatment Patient Details Name: [name] MRN: [id number] DOB: 12/14/1940 Today's Date: 01/26/2013 Time: 1321-1400 PT Time Calculation (min): 39 min  PT Assessment / Plan / Recommendation  History of Present Illness Pt is a 73 y/o female admitted s/p R TKA.   PT Comments   Pt cont's to be lethargic/drowsy today but better than yesterday.  Moaning & c/o pain entire session.  Did not perform LE there-ex due to pain.     Follow Up Recommendations  SNF     Does the patient have the potential to tolerate intense rehabilitation     Barriers to Discharge        Equipment Recommendations       Recommendations for Other Services    Frequency 7X/week   Progress towards PT Goals Progress towards PT goals: Progressing toward goals (since last PT session )  Plan Current plan remains appropriate    Precautions / Restrictions Precautions Precautions: Knee;Fall Restrictions RLE Weight Bearing: Partial weight bearing RLE Partial Weight Bearing Percentage or Pounds: 50%   Pertinent Vitals/Pain "I don't know. It just hurts!" when asking if she could rate it.  Appeared to be fairly comfortable with repositioning in bed on CPM at end of session.       Mobility  Bed Mobility Overal bed mobility: Needs Assistance Bed Mobility: Sit to Supine Sit to supine: Max assist General bed mobility comments: (A) to lift LE's back into bed.  Step-by-step cueing for technique.   Transfers Overall transfer level: Needs assistance Equipment used: Rolling walker (2 wheeled) Transfers: Sit to/from [employer] Sit to Stand: +2 physical assistance;Mod assist Stand pivot transfers: Min assist General transfer comment: cues for hand placement & technique.  (A) to power up to standing, shifting weight anteriorly, & controlled descent.   Ambulation/Gait Ambulation/Gait assistance: Min assist Ambulation Distance (Feet): 5 Feet Assistive device: Rolling walker (2  wheeled) Gait Pattern/deviations: Step-to pattern;Trunk flexed;Shuffle (decreased floor clearance) General Gait Details: max cues for pushing down through UE's on RW to advance LE's, posture, use of walker.  Pt moaning entire time due to pain & having difficulty advancing LE's.        PT Goals (current goals can now be found in the care plan section) Acute Rehab PT Goals PT Goal Formulation: With patient Time For Goal Achievement: 02/06/13 Potential to Achieve Goals: Good  Visit Information  Last PT Received On: 01/26/13 Assistance Needed: +2 (safety) History of Present Illness: Pt is a 73 y/o female admitted s/p R TKA.    Subjective Data      Cognition  Cognition Arousal/Alertness: Lethargic Behavior During Therapy: WFL for tasks assessed/performed Overall Cognitive Status: Impaired/Different from baseline Area of Impairment: Safety/judgement;Problem solving;Attention Current Attention Level: Sustained Following Commands: Follows one step commands with increased time Problem Solving: Slow processing;Decreased initiation;Difficulty sequencing;Requires verbal cues;Requires tactile cues    Balance     End of Session PT - End of Session Equipment Utilized During Treatment: Gait belt Activity Tolerance: Patient limited by lethargy;Patient limited by pain Patient left: in bed;in CPM;with call bell/phone within reach;with family/visitor present Nurse Communication: Mobility status   GP     Lara Mulch 01/26/2013, 2:08 PM  Verdell Face, PTA [phone] 01/26/2013

## 2013-01-26 NOTE — Progress Notes (Signed)
ANTICOAGULATION CONSULT NOTE - Follow Up Consult  Pharmacy Consult for Coumadin Indication: Mechanical AVR  Allergies  Allergen Reactions  . Shellfish Allergy Anaphylaxis    Not allergic to betadine  . Codeine Other (See Comments)    Feels sick    Patient Measurements: Height: 5\' 2"  (157.5 cm) Weight: 181 lb 14.1 oz (82.5 kg) IBW/kg (Calculated) : 50.1 Heparin Dosing Weight:   Vital Signs: Temp: 97.8 F (36.6 C) (01/09 0559) BP: 144/49 mmHg (01/09 1052) Pulse Rate: 69 (01/09 1052)  Labs:  Recent Labs  01/24/13 0313 01/25/13 0430 01/26/13 0540  HGB 8.9* 9.5* 9.7*  HCT 27.7* 29.5* 30.1*  PLT 242 245 180  LABPROT 14.8 19.2* 23.8*  INR 1.19 1.67* 2.21*  CREATININE 1.24* 1.57* 1.31*    Estimated Creatinine Clearance: 38.7 ml/min (by C-G formula based on Cr of 1.31).  Assessment: 72yof on chronic Coumadin for mechanical AVR. INR has trended up to 2.21. Goal INR is listed by [facility]in Clinic is 2.5-3.5. However, the recommended INR goal for mechanical AVR is 2-3 - Cardiology, can you please clarify INR goal.  - PTA regimen: 2.5mg  daily - H/H improving, Plts decreased  Goal of Therapy:  INR 2.5-3.5? (typical recommended goal of 2-3)   Plan:  1. Coumadin 2mg  po x 1 today 2. Follow-up AM INR 3. Cardiology - can you please clarify INR goal for patient  03/26/13 01/26/2013,2:29 PM

## 2013-01-26 NOTE — Progress Notes (Signed)
Inpatient Diabetes Program Recommendations  AACE/ADA: New Consensus Statement on Inpatient Glycemic Control (2013)  Target Ranges:  Prepandial:   less than 140 mg/dL      Peak postprandial:   less than 180 mg/dL (1-2 hours)      Critically ill patients:  140 - 180 mg/dL   Reason for Visit: Results for [name], [name] (MRN [id number]) as of 01/26/2013 14:52  Ref. Range 01/25/2013 16:55 01/25/2013 21:26 01/26/2013 06:33 01/26/2013 10:37 01/26/2013 12:03  Glucose-Capillary Latest Range: 70-99 mg/dL 193 (H) 790 (H) 240 (H) 383 (H) 367 (H)   Patient has not been meeting criteria for meal coverage Novolog, due to eating less than 50%.  May consider increasing Novolog correction to resistant.    Thanks, Beryl Meager, RN, BC-ADM Inpatient Diabetes Coordinator Pager [phone]

## 2013-01-26 NOTE — Progress Notes (Signed)
Patient with Foley cath d/c this am, fluids encouraged. Patient assisted to use BSC and bedpan without spontaneous void.  Bladder scan showed 475 ml of urine, I+O cath performed to produce of urine.  Fluids continue to be encouraged. Nursing will continue to monitor.

## 2013-01-27 LAB — PROTIME-INR
INR: 2.52 — AB (ref 0.00–1.49)
Prothrombin Time: 26.3 seconds — ABNORMAL HIGH (ref 11.6–15.2)

## 2013-01-27 LAB — GLUCOSE, CAPILLARY
GLUCOSE-CAPILLARY: 177 mg/dL — AB (ref 70–99)
Glucose-Capillary: 162 mg/dL — ABNORMAL HIGH (ref 70–99)

## 2013-01-27 LAB — URINE CULTURE
COLONY COUNT: NO GROWTH
Culture: NO GROWTH

## 2013-01-27 MED ORDER — WARFARIN SODIUM 2 MG PO TABS: 1.0000 mg | ORAL_TABLET | Freq: Every day | ORAL | Status: DC

## 2013-01-27 MED ORDER — HYDROMORPHONE HCL PF 1 MG/ML IJ SOLN
1.0000 mg | Freq: Once | INTRAMUSCULAR | Status: DC
Start: 2013-01-27 — End: 2013-01-27

## 2013-01-27 MED ORDER — HYDROCODONE-ACETAMINOPHEN 5-325 MG PO TABS: 1.0000 | ORAL_TABLET | Freq: Four times a day (QID) | ORAL | Status: DC | PRN

## 2013-01-27 MED ORDER — HALOPERIDOL LACTATE 5 MG/ML IJ SOLN
INTRAMUSCULAR | Status: AC
  Administered 2013-01-27: 2.5 mg
  Filled 2013-01-27: qty 1

## 2013-01-27 MED ORDER — FUROSEMIDE 40 MG PO TABS
40.0000 mg | ORAL_TABLET | Freq: Once | ORAL | Status: DC
  Filled 2013-01-27: qty 1

## 2013-01-27 MED ORDER — HALOPERIDOL LACTATE 5 MG/ML IJ SOLN: 2.5000 mg | Freq: Once | INTRAMUSCULAR | Status: DC

## 2013-01-27 MED ORDER — TAMSULOSIN HCL 0.4 MG PO CAPS
0.4000 mg | ORAL_CAPSULE | Freq: Every day | ORAL | Status: DC
  Administered 2013-01-27: 0.4 mg via ORAL
  Filled 2013-01-27 (×2): qty 1

## 2013-01-27 MED ORDER — HYDROMORPHONE HCL PF 1 MG/ML IJ SOLN
INTRAMUSCULAR | Status: AC
  Administered 2013-01-27: 1 mg
  Filled 2013-01-27: qty 1

## 2013-01-27 MED ORDER — WARFARIN SODIUM 2 MG PO TABS
2.0000 mg | ORAL_TABLET | Freq: Once | ORAL | Status: DC
  Filled 2013-01-27: qty 1

## 2013-01-27 NOTE — Progress Notes (Addendum)
[facility] hospitalist consult note     Patient ID: [name]  female  AUQ:[id number]    DOB: [birth year]    DOA: 01/23/2013  PCP: Londell Moh, MD  Assessment/Plan: Active Problems:    S/P total knee replacement  -per orthopedics team  - Patient is having significant pain issues today, daughter at the bedside, states that the pain medications are not working. Also reports that she had difficult time whole night with pain, sitting in the recliner, moaning.     Urinary retention with possible UTI  Started on Flomax, in and out catheter use for now, if continues to retain will place Foley catheter and discharge with Foley catheter in Flomax, outpatient urology followup post discharge. Updated daughter in detail.   Continue with Keflex for now. Follow urine cultures be     HTN (hypertension)  - Somewhat elevated due to pain, continue Toprol-XL, placed on hydralazine as needed, hold Lasix and Cozaar due to worsened creatinine, creat better now, will resume Lasix in am ACE upon DC.     Type 2 diabetes mellitus: Somewhat elevated due to stress, surgery, pain, patient is erratically eating - Continue Lantus 60 units in a.m., recommended the nurse to give her 10 units of NovoLog now  - Continue NovoLog 7 units TID AC if she eats more than 50%, sliding scale  Lab Results  Component Value Date   HGBA1C 8.9* 01/23/2013    CBG (last 3)   Recent Labs  01/26/13 2249 01/27/13 0438 01/27/13 0806  GLUCAP 171* 177* 162*       Rheumatoid arthritis- chronic immunosuppression  -on Rheumatrex pre admit but will defer to orthopedics re: resume since acutely post op     History of GI bleed/2012  -PPI for stress ulcer prevention     H/O mechanical aortic valve replacement/Chronic anticoagulation (Coumadin)   - Coumadin restarted, INR goal 2.5 per cardiology  Lab Results  Component Value Date   INR 2.52* 01/27/2013   INR 2.21* 01/26/2013   INR 1.67* 01/25/2013      Dyslipidemia  On statin    CAD, CABG 1991, redo 2006, S/P DES to Left Main 04/01/12 post cardiac arrest  -per Cards , restarted on Plavix    Cardiomyopathy, ischemic, EF 35-40% 2D 03/31/12/Left ventricular diastolic dysfunction, NYHA class 2  -per Cards- appears compensated       DVT Prophylaxis: On Coumadin      Code Status:full  Family Communication: daughter  Disposition: PER Primary team    Subjective: Moaning with pain, daughter at the bedside, states having difficult time sitting up due to pain   Objective: Weight change: 0.7 kg (1 lb 8.7 oz)  Intake/Output Summary (Last 24 hours) at 01/27/13 0941 Last data filed at 01/27/13 0400  Gross per 24 hour  Intake    360 ml  Output   1500 ml  Net  -1140 ml   Blood pressure 152/50, pulse 65, temperature 98.7 F (37.1 C), temperature source Oral, resp. rate 18, height 5\' 2"  (1.575 m), weight 83.2 kg (183 lb 6.8 oz), SpO2 99.00%.  Physical Exam: General: Moaning and groaning , better once in bed CVS: S1-S2 clear, no murmur rubs or gallops Chest:CTAB Abdomen: soft NT, ND, NBS  Extremities: no c/C/E Bilaterally   Lab Results: Basic Metabolic Panel:  Recent Labs Lab 01/25/13 0430 01/26/13 0540  NA 135* 134*  K 4.6 4.4  CL 92* 93*  CO2 24 26  GLUCOSE 166* 199*  BUN 30* 32*  CREATININE  1.57* 1.31*  CALCIUM 8.6 8.5   Liver Function Tests: No results found for this basename: AST, ALT, ALKPHOS, BILITOT, PROT, ALBUMIN,  in the last 168 hours No results found for this basename: LIPASE, AMYLASE,  in the last 168 hours No results found for this basename: AMMONIA,  in the last 168 hours CBC:  Recent Labs Lab 01/25/13 0430 01/26/13 0540  WBC 14.6* 11.4*  HGB 9.5* 9.7*  HCT 29.5* 30.1*  MCV 99.7 99.7  PLT 245 180   Cardiac Enzymes: No results found for this basename: CKTOTAL, CKMB, CKMBINDEX, TROPONINI,  in the last 168 hours BNP: No components found with this basename: POCBNP,   CBG:  Recent Labs Lab 01/26/13 1203 01/26/13 1630 01/26/13 2249 01/27/13 0438 01/27/13 0806  GLUCAP 367* 303* 171* 177* 162*     Micro Results: No results found for this or any previous visit (from the past 240 hour(s)).  Studies/Results: Chest 2 View  01/16/2013   CLINICAL DATA:  Preop chest x-ray  EXAM: CHEST  2 VIEW  COMPARISON:  04/03/2012  FINDINGS: Bilateral mild interstitial thickening likely chronic. There is no focal parenchymal opacity, pleural effusion, or pneumothorax. Mild stable cardiomegaly. Prior CABG.  The osseous structures are unremarkable.  IMPRESSION: No active cardiopulmonary disease.   Electronically Signed   By: Elige Ko   On: 01/16/2013 14:12    Medications: Scheduled Meds: . atorvastatin  40 mg Oral q1800  . calcium carbonate  1 tablet Oral BID WC  . cephALEXin  500 mg Oral Q8H  . clopidogrel  75 mg Oral Q breakfast  . docusate sodium  100 mg Oral BID  . fenofibrate  160 mg Oral Daily  . folic acid  0.5 mg Oral Daily  . gabapentin  300 mg Oral TID  . haloperidol lactate      . HYDROmorphone      . insulin aspart  0-5 Units Subcutaneous QHS  . insulin aspart  0-9 Units Subcutaneous TID WC  . insulin aspart  7 Units Subcutaneous TID WC  . insulin glargine  60 Units Subcutaneous Daily  . metoprolol succinate  25 mg Oral BID  . multivitamin with minerals  1 tablet Oral Daily  . pantoprazole  40 mg Oral Q0600  . potassium chloride SA  20 mEq Oral Q M,W,F  . tamsulosin  0.4 mg Oral QPC breakfast  . Warfarin - Pharmacist Dosing Inpatient   Does not apply q1800      LOS: 4 days   Leroy Sea M.D. [facility] 01/27/2013, 9:41 AM Pager: [phone]  If 7PM-7AM, please contact night-coverage [email or web address] Password [email or web address]

## 2013-01-27 NOTE — Progress Notes (Signed)
Patient voided 150cc clear, yellow urine on BSC at 2300.    At 4am patient unable to void and complaining of discomfort to "bottom."  Bladder scan earlier revealed 177cc.  In and cath performed at 0400 and 400cc clear, yellow urine obtained.  Patient tolerated well.  Large varicose vein noted to left labia.    Continue to monitor for adequate spontaneous void.

## 2013-01-27 NOTE — Progress Notes (Addendum)
Ortho tech placed patient in CPM per MD order.  Patient unable to tolerate, crying in pain and CPM had to be removed immediately.  Patient had received 2 Norco 7.5 tabs at 1830 and was sleeping quietly prior to CPM application.  Patient then requested to get out of bed, "I gotta get up, I gotta get up.'  Pt requires moderate 2+ assist to transfer to chair with maximum cueing.  Pt attempts to follow commands but sometimes gets mixed up with direction (tell her to move backwards and she tries to step forward.)  Patient did settled down and fell back to sleep after placed in recliner with pressure redistribution pad in place.  Skin intact.  Some bruising is present to left hip and lower abdomen (on coumadin).  Daughter at bedside and supportive with care.

## 2013-01-27 NOTE — Progress Notes (Signed)
Patient is medically stable for D/C to [facility]. Clinical Child psychotherapist (CSW) arranged D/C packet and non-emergency EMS ([facility]) for transport. Crystal Multimedia programmer at [employer] confirmed that everything is set up and ready for the patient to come today. CSW faxed D/C summary to Crystal at [facility]. Patient's daughter Lolita Cram was at bedside and is aware of above. Nursing is aware of above. Please reconsult if further social work needs arise. CSW signing off.   Jetta Lout, LCSWA Weekend CSW [phone]

## 2013-01-27 NOTE — Discharge Summary (Signed)
Discharge to skilled nursing facility today in stable condition. Status post right total knee arthroplasty for arthritis right knee. Plan to followup in the office in 2 weeks. Prescriptions written for Vicodin for pain and Coumadin for DVT prophylaxis. Weightbearing as tolerated no weight restrictions.

## 2013-01-27 NOTE — Progress Notes (Addendum)
Patient had episodes this past shift of restlessness and moaning/complaining of generalized pain all over which is not new behavior from previous shifts.  Patient has been receiving Norco 7.5mg  every 4-5 hours, alternating between 1-2 tablets per daughter's request.  Patient either sits/lies resting quietly with eyes closed or when she wakes up, she is crying out in discomfort, restlessness, needing to reposition, stand up.  Sometimes waves arms in the air when feels need to reposition.  Patient remains oriented X3.  She is slow to follow directions at times (for example, when eating or taking pills, have to tell her what to do).  She has trouble at times getting the right words out.  Supportive daughter remains at bedside and notes that "there are glimspes of mom's usual self when she snaps at people."  Continue to monitor for safety, pain, and cognitive status.

## 2013-01-27 NOTE — Progress Notes (Signed)
Physical Therapy Treatment Patient Details Name: [name] MRN: [id number] DOB: 02/19/1940 Today's Date: 01/27/2013 Time: 4008-6761 PT Time Calculation (min): 25 min  PT Assessment / Plan / Recommendation  History of Present Illness Pt is a 73 y/o female admitted s/p R TKA.   PT Comments   Pt making progress today. Still complains of very high pain levels in knee with mobility. Does better if you allow her time to move leg herself with cues and no physical touching of leg.   Follow Up Recommendations  SNF     Equipment Recommendations  None recommended by PT    Frequency 7X/week   Progress towards PT Goals Progress towards PT goals: Progressing toward goals  Plan Current plan remains appropriate    Precautions / Restrictions Precautions Precautions: Knee;Fall Restrictions RLE Weight Bearing: Partial weight bearing RLE Partial Weight Bearing Percentage or Pounds: 50%    Mobility  Bed Mobility Overal bed mobility: Needs Assistance Supine to sit: Mod assist;HOB elevated Sit to supine: +2 for physical assistance;Mod assist General bed mobility comments: requires step by step cues and increased time. moaning with all right leg movements, less when she move her leg herselft. assist to lift legs back into bed and to lower trunk controled onto bed surface. Once lying down pt was able to self adjust her hips and shoudlers with cues to achieve good postion. Transfers Overall transfer level: Needs assistance Equipment used: Rolling walker (2 wheeled) Transfers: Sit to/from Stand Sit to Stand: Mod assist General transfer comment: cues for hand placement and technique. cues for ant weight shift to achieve standing and to control descent. second person for safety, however not needed to assist physically today. Ambulation/Gait Ambulation/Gait assistance: Min assist;+2 physical assistance Ambulation Distance (Feet): 12 Feet (5 feet. 7 feet) Assistive device: Rolling walker (2  wheeled) Gait Pattern/deviations: Step-to pattern;Narrow base of support;Trunk flexed;Decreased stance time - right;Decreased step length - right;Decreased step length - left Gait velocity: Decreased General Gait Details: cues on posture, sequence and technique with gait with RW. Pt able to self advance right leg. no buckling noted. intermittent moaning with gait. pt wanting to prop on RW with fatique. cues needed for safety throughout gait with second person  there for safety/chair follow.    Exercises Total Joint Exercises Ankle Circles/Pumps: AROM;Both;10 reps;Supine Quad Sets: AROM;Strengthening;Both;10 reps Heel Slides: AAROM;Strengthening;Right;10 reps;Supine Hip ABduction/ADduction: AAROM;Strengthening;Right;5 reps;Supine Straight Leg Raises: AAROM;Strengthening;Right;5 reps;Supine     PT Goals (current goals can now be found in the care plan section) Acute Rehab PT Goals Patient Stated Goal: To return home with husband after rehab PT Goal Formulation: With patient Time For Goal Achievement: 02/06/13 Potential to Achieve Goals: Good  Visit Information  Last PT Received On: 01/27/13 Assistance Needed: +2 (for safety) History of Present Illness: Pt is a 73 y/o female admitted s/p R TKA.    Subjective Data  Patient Stated Goal: To return home with husband after rehab   Cognition  Cognition Arousal/Alertness: Lethargic Behavior During Therapy: WFL for tasks assessed/performed Overall Cognitive Status: Impaired/Different from baseline Area of Impairment: Safety/judgement;Problem solving;Attention Current Attention Level: Sustained Following Commands: Follows one step commands with increased time Problem Solving: Slow processing;Decreased initiation;Difficulty sequencing;Requires verbal cues;Requires tactile cues General Comments: Pt very drowsy/lethargic. Preferred to keep eyes closed, would open with cues.    End of Session PT - End of Session Equipment Utilized During  Treatment: Gait belt Activity Tolerance: Patient limited by pain Nurse Communication: Mobility status;Patient requests pain meds   GP  Sallyanne Kuster 01/27/2013, 12:53 PM  Sallyanne Kuster, PTA Office- [phone]

## 2013-01-27 NOTE — Progress Notes (Signed)
ANTICOAGULATION CONSULT NOTE - Follow Up Consult  Pharmacy Consult for Coumadin Indication: Mechanical AVR  Allergies  Allergen Reactions  . Shellfish Allergy Anaphylaxis    Not allergic to betadine  . Codeine Other (See Comments)    Feels sick    Patient Measurements: Height: 5\' 2"  (157.5 cm) Weight: 183 lb 6.8 oz (83.2 kg) IBW/kg (Calculated) : 50.1  Vital Signs: Temp: 98.7 F (37.1 C) (01/10 0430) BP: 152/50 mmHg (01/10 0430) Pulse Rate: 65 (01/10 0430)  Labs:  Recent Labs  01/25/13 0430 01/26/13 0540 01/27/13 0450  HGB 9.5* 9.7*  --   HCT 29.5* 30.1*  --   PLT 245 180  --   LABPROT 19.2* 23.8* 26.3*  INR 1.67* 2.21* 2.52*  CREATININE 1.57* 1.31*  --     Estimated Creatinine Clearance: 38.8 ml/min (by C-G formula based on Cr of 1.31).  Assessment: 73 yo F on chronic Coumadin for mechanical AVR. INR has trended up to 2.52. Goal INR as listed by Coumadin Clinic is 2.5-3.5. However, the recommended INR goal for mechanical AVR is 2-3 - Cardiology, can you please clarify INR goal?  - PTA regimen: 2.5mg  daily - H/H improving, Plts decreased  Goal of Therapy:  INR 2.5-3.5? (typical recommended goal of 2-3)   Plan:  1. Coumadin 2mg  po x 1 today 2. Follow-up AM INR 3. Cardiology - can you please clarify INR goal for patient  61, Pharm.D., BCPS Clinical Pharmacist Pager [phone] 01/27/2013 11:24 AM

## 2013-01-27 NOTE — Progress Notes (Signed)
Patient ID: REESA GOTSCHALL, female   DOB: 01/26/1940, 73 y.o.   MRN: [id number]     Primary cardiologist:  Subjective:   Hip pain this morning. No chest pain or SOB.    Objective:   Temp:  [97.8 F (36.6 C)-98.8 F (37.1 C)] 98.7 F (37.1 C) (01/10 0430) Pulse Rate:  [62-74] 65 (01/10 0430) Resp:  [16-18] 18 (01/10 0430) BP: (118-153)/(41-50) 152/50 mmHg (01/10 0430) SpO2:  [99 %-100 %] 99 % (01/10 0430) Weight:  [183 lb 6.8 oz (83.2 kg)] 183 lb 6.8 oz (83.2 kg) (01/10 0500) Last BM Date: 01/22/13  Filed Weights   01/25/13 0650 01/26/13 0500 01/27/13 0500  Weight: 183 lb 3.2 oz (83.1 kg) 181 lb 14.1 oz (82.5 kg) 183 lb 6.8 oz (83.2 kg)    Intake/Output Summary (Last 24 hours) at 01/27/13 1005 Last data filed at 01/27/13 0400  Gross per 24 hour  Intake    360 ml  Output   1500 ml  Net  -1140 ml    Telemetry:sinus rhythm, PVCs.  Exam:  General: NAD  Resp: CTAB  Cardiac: RRR, 2/6 systolic murmur RUSB, mechanical S2  WF:UXNATFT soft, NT, ND  DDU:[id number] warm, no edema  Neuro: no focal deficits    Lab Results:  Basic Metabolic Panel:  Recent Labs Lab 01/24/13 0313 01/25/13 0430 01/26/13 0540  NA 135* 135* 134*  K 4.5 4.6 4.4  CL 95* 92* 93*  CO2 28 24 26   GLUCOSE 184* 166* 199*  BUN 22 30* 32*  CREATININE 1.24* 1.57* 1.31*  CALCIUM 8.4 8.6 8.5    Liver Function Tests: No results found for this basename: AST, ALT, ALKPHOS, BILITOT, PROT, ALBUMIN,  in the last 168 hours  CBC:  Recent Labs Lab 01/24/13 0313 01/25/13 0430 01/26/13 0540  WBC 12.6* 14.6* 11.4*  HGB 8.9* 9.5* 9.7*  HCT 27.7* 29.5* 30.1*  MCV 99.6 99.7 99.7  PLT 242 245 180    Cardiac Enzymes: No results found for this basename: CKTOTAL, CKMB, CKMBINDEX, TROPONINI,  in the last 168 hours  BNP:  Recent Labs  03/30/12 2116  PROBNP 2717.0*    Coagulation:  Recent Labs Lab 01/25/13 0430 01/26/13 0540 01/27/13 0450  INR 1.67* 2.21* 2.52*     ECG:   Medications:   Scheduled Medications: . atorvastatin  40 mg Oral q1800  . calcium carbonate  1 tablet Oral BID WC  . cephALEXin  500 mg Oral Q8H  . clopidogrel  75 mg Oral Q breakfast  . docusate sodium  100 mg Oral BID  . fenofibrate  160 mg Oral Daily  . folic acid  0.5 mg Oral Daily  . [START ON 01/28/2013] furosemide  40 mg Oral Once  . gabapentin  300 mg Oral TID  . haloperidol lactate      . HYDROmorphone      . insulin aspart  0-5 Units Subcutaneous QHS  . insulin aspart  0-9 Units Subcutaneous TID WC  . insulin aspart  7 Units Subcutaneous TID WC  . insulin glargine  60 Units Subcutaneous Daily  . metoprolol succinate  25 mg Oral BID  . multivitamin with minerals  1 tablet Oral Daily  . pantoprazole  40 mg Oral Q0600  . potassium chloride SA  20 mEq Oral Q M,W,F  . tamsulosin  0.4 mg Oral QPC breakfast  . Warfarin - Pharmacist Dosing Inpatient   Does not apply q1800     Infusions:     PRN Medications:  alum & mag hydroxide-simeth, bisacodyl, hydrALAZINE, HYDROcodone-acetaminophen, magnesium hydroxide, nitroGLYCERIN, ondansetron (ZOFRAN) IV, ondansetron, temazepam     Assessment/Plan   73 yo female history of CAD with prior CABG in 1991 and redo 2006, prior DES to left main 03/2012, history of mechanical AVR, ischemic CM LVEF 35-40%, we are assisting with management of chronic cardiac problems in setting of recent knee replacement.   1. CAD/ICM - LVEF 35-40% by echo 02/2012 - ARB on hold due to AKI, will continue to follow Cr before resuming. Continue Toprol XL - she is back on plavix, not on ASA because she is on coumadin as well for her history of AVR, avoiding high bleeding risks with triple therapy.   2. AKI - ARB and lasix on hold, Cr has been trending down. Resume medications when renal function stable. - ordered BMET for tomorrow AM  3. Hx of AVR - coumadin resumed postoperatively, INR is therapeutic.     Dina Rich, M.D.,  F.A.C.C.

## 2013-01-29 ENCOUNTER — Other Ambulatory Visit: Payer: Self-pay | Admitting: *Deleted

## 2013-01-29 LAB — GLUCOSE, CAPILLARY: Glucose-Capillary: 220 mg/dL — ABNORMAL HIGH (ref 70–99)

## 2013-01-29 MED ORDER — TRAMADOL HCL 50 MG PO TABS: ORAL_TABLET | ORAL | Status: DC

## 2013-01-30 ENCOUNTER — Encounter: Payer: Self-pay | Admitting: Adult Health

## 2013-01-30 ENCOUNTER — Non-Acute Institutional Stay (SKILLED_NURSING_FACILITY): Payer: Medicare Other | Admitting: Adult Health

## 2013-01-30 DIAGNOSIS — F3289 Other specified depressive episodes: Secondary | ICD-10-CM

## 2013-01-30 DIAGNOSIS — IMO0002 Reserved for concepts with insufficient information to code with codable children: Secondary | ICD-10-CM

## 2013-01-30 DIAGNOSIS — K5900 Constipation, unspecified: Secondary | ICD-10-CM

## 2013-01-30 DIAGNOSIS — Z96659 Presence of unspecified artificial knee joint: Secondary | ICD-10-CM

## 2013-01-30 DIAGNOSIS — F32A Depression, unspecified: Secondary | ICD-10-CM

## 2013-01-30 DIAGNOSIS — I5043 Acute on chronic combined systolic (congestive) and diastolic (congestive) heart failure: Secondary | ICD-10-CM

## 2013-01-30 DIAGNOSIS — Z7901 Long term (current) use of anticoagulants: Secondary | ICD-10-CM

## 2013-01-30 DIAGNOSIS — I2510 Atherosclerotic heart disease of native coronary artery without angina pectoris: Secondary | ICD-10-CM

## 2013-01-30 DIAGNOSIS — Z954 Presence of other heart-valve replacement: Secondary | ICD-10-CM

## 2013-01-30 DIAGNOSIS — M1710 Unilateral primary osteoarthritis, unspecified knee: Secondary | ICD-10-CM

## 2013-01-30 DIAGNOSIS — F329 Major depressive disorder, single episode, unspecified: Secondary | ICD-10-CM

## 2013-01-30 DIAGNOSIS — Z952 Presence of prosthetic heart valve: Secondary | ICD-10-CM

## 2013-01-30 DIAGNOSIS — E785 Hyperlipidemia, unspecified: Secondary | ICD-10-CM

## 2013-01-30 DIAGNOSIS — M1711 Unilateral primary osteoarthritis, right knee: Secondary | ICD-10-CM

## 2013-01-30 DIAGNOSIS — E1159 Type 2 diabetes mellitus with other circulatory complications: Secondary | ICD-10-CM | POA: Insufficient documentation

## 2013-01-30 MED ORDER — HYDROCODONE-ACETAMINOPHEN 10-325 MG PO TABS: 1.0000 | ORAL_TABLET | Freq: Four times a day (QID) | ORAL | Status: AC

## 2013-01-30 MED ORDER — POLYETHYLENE GLYCOL 3350 17 GM/SCOOP PO POWD: 17.0000 g | Freq: Every day | ORAL | Status: AC

## 2013-01-30 NOTE — Progress Notes (Signed)
Patient ID: [name], female   DOB: 05-14-1940, 73 y.o.   MRN: [id number]     [facility]  Allergies  Allergen Reactions  . Shellfish Allergy Anaphylaxis    Not allergic to betadine  . Codeine Other (See Comments)    Feels sick     Chief Complaint  Patient presents with  . Hospitalization Follow-up    HPI:  She has been hospitalized for a right knee replacement and here for short term rehab. She is having increased knee pain which is not presently being relieved.  Her goal at this time is to return back home.    Past Medical History  Diagnosis Date  . High blood pressure   . Irregular heart rhythm   . High cholesterol   . Coronary artery disease   . COPD (chronic obstructive pulmonary disease)   . Anemia   . Irregular heart beat   . Myocardial infarction march 2013  . Peripheral vascular disease   . Cardiac arrest   . CHF (congestive heart failure) 01/06/12    Transthoracic Echocardiogram EF 45-50%  . Memory deficits 09/06/2012  . Polyneuropathy in diabetes(357.2)   . Degenerative arthritis   . Fibromyalgia   . Diabetes mellitus     Type II  . Heart murmur   . Angina at rest     no new symptoms since 11/08/12 (01/17/13)  . DM type 2 (diabetes mellitus, type 2)     Past Surgical History  Procedure Laterality Date  . Gallbadder  2003  . Cardiac valve replacement  1991  . Heart bypass  1991/2005  . Angioplasty    . Coronary artery bypass graft    . Aortic valve replacement    . Cholecystectomy    . Esophagogastroduodenoscopy  03/31/2011    Procedure: ESOPHAGOGASTRODUODENOSCOPY (EGD);  Surgeon: Beverley Fiedler, MD;  Location: [facility] ENDOSCOPY;  Service: Gastroenterology;  Laterality: N/A;  . Bypass graft  1991 and 2005  . Toenail avulsion Left   . Total knee arthroplasty Right 01/23/2013    Procedure: RIGHT TOTAL KNEE ARTHROPLASTY;  Surgeon: Valeria Batman, MD;  Location: [facility] OR;  Service: Orthopedics;  Laterality: Right;    VITAL SIGNS BP 127/54  Pulse  64  Ht 5\' 2"  (1.575 m)  Wt 183 lb (83.008 kg)  BMI 33.46 kg/m2   Patient's Medications  New Prescriptions   No medications on file  Previous Medications   ACETAMINOPHEN (TYLENOL) 325 MG TABLET    Take 650 mg by mouth 3 (three) times daily.   ATORVASTATIN (LIPITOR) 40 MG TABLET    Take 40 mg by mouth daily.   BISACODYL (DULCOLAX) 10 MG SUPPOSITORY    Place 1 suppository (10 mg total) rectally daily as needed for moderate constipation.   CALCIUM CARBONATE (OS-CAL) 600 MG TABS    Take 600 mg by mouth 2 (two) times daily.     CLOPIDOGREL (PLAVIX) 75 MG TABLET    Take 1 tablet (75 mg total) by mouth daily with breakfast.   DOCUSATE SODIUM 100 MG CAPS    Take 100 mg by mouth 2 (two) times daily.   DULOXETINE (CYMBALTA) 60 MG CAPSULE    Take 60 mg by mouth daily.   FENOFIBRATE 160 MG TABLET    Take 160 mg by mouth daily.   FOLIC ACID (FOLVITE) 400 MCG TABLET    Take 400 mcg by mouth daily.   FUROSEMIDE (LASIX) 40 MG TABLET    Take 1 tablet (40 mg total)  by mouth 2 (two) times daily.   GABAPENTIN (NEURONTIN) 300 MG CAPSULE    Take 300 mg by mouth 3 (three) times daily.    HYDROCODONE-ACETAMINOPHEN (NORCO) 5-325 MG PER TABLET    Take 1 tablet by mouth every 6 (six) hours as needed.   INSULIN GLARGINE (LANTUS) 100 UNIT/ML INJECTION    Inject 0.6 mLs (60 Units total) into the skin daily.   INSULIN LISPRO (HUMALOG) 100 UNIT/ML INJECTION    Inject 7 Units into the skin 3 (three) times daily with meals.   LOSARTAN (COZAAR) 100 MG TABLET    Take 100 mg by mouth daily.   METOPROLOL SUCCINATE (TOPROL-XL) 25 MG 24 HR TABLET    Take 1 tablet (25 mg total) by mouth 2 (two) times daily.   MULTIPLE VITAMIN (MULTIVITAMIN) CAPSULE    Take 1 capsule by mouth daily.   NITROGLYCERIN (NITROSTAT) 0.4 MG SL TABLET    Place 0.4 mg under the tongue every 5 (five) minutes as needed for chest pain.   POTASSIUM CHLORIDE SA (K-DUR,KLOR-CON) 20 MEQ TABLET    Take 20 mEq by mouth every Monday, Wednesday, and Friday.    TRAMADOL (ULTRAM) 50 MG TABLET    Take 1/2 tablet by mouth every 6 hours as needed for moderate to severe pain   VITAMIN B-12 (CYANOCOBALAMIN) 1000 MCG TABLET    Take 1,000 mcg by mouth 2 (two) times daily.     WARFARIN (COUMADIN) 5 MG TABLET    Take 1.5 mg by mouth daily.   Modified Medications   No medications on file  Discontinued Medications   HYDROCODONE-ACETAMINOPHEN (NORCO) 7.5-325 MG PER TABLET    Take 1 tablet by mouth every 4 (four) hours as needed for moderate pain or severe pain.   INSULIN GLARGINE (LANTUS) 100 UNIT/ML INJECTION    Inject 0.6 mLs (60 Units total) into the skin daily with breakfast.   WARFARIN (COUMADIN) 2 MG TABLET    Take 0.5 tablets (1 mg total) by mouth daily.    SIGNIFICANT DIAGNOSTIC EXAMS   01-16-13: chest x-ray: No active cardiopulmonary disease.   LABS REVIEWED  01-23-13: wbc 10.6; hgb 10.3; hct 37.2; mcv 100.9; plt 302; hgb a1c 8.9 01-24-13: wbc 12.6; hgb 8.9; hct 27.7; mcv 99.6; plt 242; glucose 184; bun 22; creat 1.24; k+4.5; na++135 01-26-13: urine culture: no growth      Review of Systems  Constitutional: Negative for malaise/fatigue.  Eyes: Negative for blurred vision.  Respiratory: Negative for cough and shortness of breath.   Cardiovascular: Negative for chest pain, palpitations and leg swelling.  Gastrointestinal: Positive for constipation. Negative for heartburn.  Musculoskeletal: Positive for joint pain. Negative for myalgias.  Skin: Negative.   Neurological: Negative for headaches.  Psychiatric/Behavioral: Negative for depression. The patient does not have insomnia.     Physical Exam  Constitutional: She is oriented to person, place, and time. She appears well-developed and well-nourished. No distress.  overweight  Neck: Neck supple. No JVD present.  Cardiovascular: Normal rate, regular rhythm and intact distal pulses.   Respiratory: Effort normal and breath sounds normal. No respiratory distress. She has no wheezes.  GI: Soft.  Bowel sounds are normal. She exhibits no distension. There is no tenderness.  Musculoskeletal: She exhibits no edema.  Has limited range of motion in the right knee. There is slight swelling present. Is able to move all other extremities.   Neurological: She is alert and oriented to person, place, and time.  Skin: Skin is warm and dry. She  is not diaphoretic.  Right knee incision line without signs of infection   Psychiatric: She has a normal mood and affect.    ASSESSMENT/ PLAN:  1. Osteoarthritis of right knee status post right knee replacement: will continue therapy as directed; will continue cymbalta 60 mg daily for pain management and will continue neurontin 300 mg three times daily  will change vicodin to 10/325 mg every 6 hours routinely; will begin robaxin 500 mg every 6 hours as needed; will stop the routine tylenol and ultram and will monitor   2. Constipation: is worse will continue colace twice daily and will begin miralax daily   3. Dyslipidemia: will continue lipitor 40 mg daily and fenofibrate 160 mg daily   4. Cad is stable no complaints of chest pain will continue plavix 75 mg daily and ntg prn will monitor  5. Hypertension: is stable will continue cozaar 100 mg daily toprol xl 25 mg twice daily   6. Heart failure: is stable will continue lasix 40 mg twice daily and k+ 20 meq daily will monitor  7. Diabetes: is presently stable will continue lantus 60 units daily and will continue humalog 7 units prior to meals and will monitor   8. Mechanical valve replacement on coumadin therapy: on 01-29-13 inr was 2.5 she was started on 1.5 mg daily   9. Depression she is emotionally stable will continue cymbalta 60 mg daily she also takes this medication for pain management.   Will check cbc and bmp in one week.

## 2013-02-01 ENCOUNTER — Encounter: Payer: Self-pay | Admitting: Internal Medicine

## 2013-02-01 ENCOUNTER — Non-Acute Institutional Stay (SKILLED_NURSING_FACILITY): Payer: Medicare Other | Admitting: Internal Medicine

## 2013-02-01 DIAGNOSIS — L03119 Cellulitis of unspecified part of limb: Secondary | ICD-10-CM

## 2013-02-01 DIAGNOSIS — E1159 Type 2 diabetes mellitus with other circulatory complications: Secondary | ICD-10-CM

## 2013-02-01 DIAGNOSIS — N390 Urinary tract infection, site not specified: Secondary | ICD-10-CM

## 2013-02-01 DIAGNOSIS — F32A Depression, unspecified: Secondary | ICD-10-CM

## 2013-02-01 DIAGNOSIS — L02419 Cutaneous abscess of limb, unspecified: Secondary | ICD-10-CM

## 2013-02-01 DIAGNOSIS — I10 Essential (primary) hypertension: Secondary | ICD-10-CM

## 2013-02-01 DIAGNOSIS — M1710 Unilateral primary osteoarthritis, unspecified knee: Secondary | ICD-10-CM

## 2013-02-01 DIAGNOSIS — Z954 Presence of other heart-valve replacement: Secondary | ICD-10-CM

## 2013-02-01 DIAGNOSIS — K5900 Constipation, unspecified: Secondary | ICD-10-CM

## 2013-02-01 DIAGNOSIS — L03115 Cellulitis of right lower limb: Secondary | ICD-10-CM

## 2013-02-01 DIAGNOSIS — I2510 Atherosclerotic heart disease of native coronary artery without angina pectoris: Secondary | ICD-10-CM

## 2013-02-01 DIAGNOSIS — M1711 Unilateral primary osteoarthritis, right knee: Secondary | ICD-10-CM

## 2013-02-01 DIAGNOSIS — Z7901 Long term (current) use of anticoagulants: Secondary | ICD-10-CM

## 2013-02-01 DIAGNOSIS — Z952 Presence of prosthetic heart valve: Secondary | ICD-10-CM

## 2013-02-01 DIAGNOSIS — IMO0002 Reserved for concepts with insufficient information to code with codable children: Secondary | ICD-10-CM

## 2013-02-01 DIAGNOSIS — F3289 Other specified depressive episodes: Secondary | ICD-10-CM

## 2013-02-01 DIAGNOSIS — I5189 Other ill-defined heart diseases: Secondary | ICD-10-CM

## 2013-02-01 DIAGNOSIS — F329 Major depressive disorder, single episode, unspecified: Secondary | ICD-10-CM

## 2013-02-01 DIAGNOSIS — I519 Heart disease, unspecified: Secondary | ICD-10-CM

## 2013-02-01 NOTE — Progress Notes (Signed)
Patient ID: [name], female   DOB: 27-Jun-1940, 73 y.o.   MRN: [id number]    [facility]    PCP: Londell Moh, MD  Code Status: full code  Allergies  Allergen Reactions  . Shellfish Allergy Anaphylaxis    Not allergic to betadine  . Codeine Other (See Comments)    Feels sick    Chief Complaint: new admit  HPI:  73 y/o female patient is here for STR after right total knee arthroplasty. She is working with therapy team and has made progress. She has regular bowel movement. Her pain is under control. Denies any complaints this visit. No concern from staff  Review of Systems  Constitutional: Negative for fever, chills, weight loss, malaise/fatigue and diaphoresis.  HENT: Negative for congestion, hearing loss and sore throat.   Eyes: Negative for blurred vision, double vision and discharge.  Respiratory: Negative for cough, sputum production, shortness of breath and wheezing.   Cardiovascular: Negative for chest pain, palpitations, orthopnea and leg swelling.  Gastrointestinal: Negative for heartburn, nausea, vomiting, abdominal pain, diarrhea and constipation.  Genitourinary: Negative for dysuria, urgency, frequency and flank pain.  Musculoskeletal: Negative for back pain, falls and myalgias.  Skin: Negative for itching and rash.  Neurological: Negative for dizziness, tingling, focal weakness and headaches.  Psychiatric/Behavioral: Negative for depression and memory loss. The patient is not nervous/anxious.     Past Medical History  Diagnosis Date  . High blood pressure   . Irregular heart rhythm   . High cholesterol   . Coronary artery disease   . COPD (chronic obstructive pulmonary disease)   . Anemia   . Irregular heart beat   . Myocardial infarction march 2013  . Peripheral vascular disease   . Cardiac arrest   . CHF (congestive heart failure) 01/06/12    Transthoracic Echocardiogram EF 45-50%  . Memory deficits 09/06/2012  .  Polyneuropathy in diabetes(357.2)   . Degenerative arthritis   . Fibromyalgia   . Diabetes mellitus     Type II  . Heart murmur   . Angina at rest     no new symptoms since 11/08/12 (01/17/13)  . DM type 2 (diabetes mellitus, type 2)    Past Surgical History  Procedure Laterality Date  . Gallbadder  2003  . Cardiac valve replacement  1991  . Heart bypass  1991/2005  . Angioplasty    . Coronary artery bypass graft    . Aortic valve replacement    . Cholecystectomy    . Esophagogastroduodenoscopy  03/31/2011    Procedure: ESOPHAGOGASTRODUODENOSCOPY (EGD);  Surgeon: Beverley Fiedler, MD;  Location: [facility] ENDOSCOPY;  Service: Gastroenterology;  Laterality: N/A;  . Bypass graft  1991 and 2005  . Toenail avulsion Left   . Total knee arthroplasty Right 01/23/2013    Procedure: RIGHT TOTAL KNEE ARTHROPLASTY;  Surgeon: Valeria Batman, MD;  Location: [facility] OR;  Service: Orthopedics;  Laterality: Right;   Social History:   reports that she quit smoking about 40 years ago. Her smoking use included Cigarettes. She has a 7.5 pack-year smoking history. She has never used smokeless tobacco. She reports that she does not drink alcohol or use illicit drugs.  Family History  Problem Relation Age of Onset  . Rheum arthritis Mother   . Cervical cancer Mother   . Stomach cancer Mother   . Cancer Mother   . Hyperlipidemia Mother   . Hypertension Mother   . Rheum arthritis Father   . Rheum arthritis  Paternal Aunt   . Anesthesia problems Neg Hx   . Deep vein thrombosis Brother   . Hypertension Brother   . Hypertension Daughter     Medications: Patient's Medications  New Prescriptions   No medications on file  Previous Medications   ATORVASTATIN (LIPITOR) 40 MG TABLET    Take 40 mg by mouth daily.   BISACODYL (DULCOLAX) 10 MG SUPPOSITORY    Place 1 suppository (10 mg total) rectally daily as needed for moderate constipation.   CALCIUM CARBONATE (OS-CAL) 600 MG TABS    Take 600 mg by mouth 2 (two)  times daily.     CLOPIDOGREL (PLAVIX) 75 MG TABLET    Take 1 tablet (75 mg total) by mouth daily with breakfast.   DOCUSATE SODIUM 100 MG CAPS    Take 100 mg by mouth 2 (two) times daily.   DULOXETINE (CYMBALTA) 60 MG CAPSULE    Take 60 mg by mouth daily.   FENOFIBRATE 160 MG TABLET    Take 160 mg by mouth daily.   FOLIC ACID (FOLVITE) 400 MCG TABLET    Take 400 mcg by mouth daily.   FUROSEMIDE (LASIX) 40 MG TABLET    Take 1 tablet (40 mg total) by mouth 2 (two) times daily.   GABAPENTIN (NEURONTIN) 300 MG CAPSULE    Take 300 mg by mouth 3 (three) times daily.    HYDROCODONE-ACETAMINOPHEN (NORCO) 10-325 MG PER TABLET    Take 1 tablet by mouth every 6 (six) hours.   INSULIN GLARGINE (LANTUS) 100 UNIT/ML INJECTION    Inject 0.6 mLs (60 Units total) into the skin daily.   INSULIN LISPRO (HUMALOG) 100 UNIT/ML INJECTION    Inject 7 Units into the skin 3 (three) times daily with meals.   LOSARTAN (COZAAR) 100 MG TABLET    Take 100 mg by mouth daily.   METOPROLOL SUCCINATE (TOPROL-XL) 25 MG 24 HR TABLET    Take 1 tablet (25 mg total) by mouth 2 (two) times daily.   MULTIPLE VITAMIN (MULTIVITAMIN) CAPSULE    Take 1 capsule by mouth daily.   NITROGLYCERIN (NITROSTAT) 0.4 MG SL TABLET    Place 0.4 mg under the tongue every 5 (five) minutes as needed for chest pain.   POLYETHYLENE GLYCOL POWDER (GLYCOLAX/MIRALAX) POWDER    Take 17 g by mouth daily.   POTASSIUM CHLORIDE SA (K-DUR,KLOR-CON) 20 MEQ TABLET    Take 20 mEq by mouth every Monday, Wednesday, and Friday.   VITAMIN B-12 (CYANOCOBALAMIN) 1000 MCG TABLET    Take 1,000 mcg by mouth 2 (two) times daily.     WARFARIN (COUMADIN) 5 MG TABLET    Take 1.5 mg by mouth daily.   Modified Medications   No medications on file  Discontinued Medications   No medications on file     Physical Exam: Filed Vitals:   02/01/13 1656  BP: 123/68  Pulse: 66  Temp: 98.4 F (36.9 C)  Resp: 16  SpO2: 96%   General- elderly female in no acute distress Head-  atraumatic, normocephalic Eyes- PERRLA, EOMI, no pallor, no icterus, no discharge Neck- no lymphadenopathy, no thyromegaly, no jugular vein distension, no carotid bruit Cardiovascular- normal s1,s2, no murmurs/ rubs/ gallops Respiratory- bilateral clear to auscultation, no wheeze, no rhonchi, no crackles Abdomen- bowel sounds present, soft, non tender Musculoskeletal- able to move all 4 extremities, right knee incision site tender to tough with serous drainage, staples in place, warm to touch, good dorsalis pedis , trace edema in right leg Neurological-  no focal deficit Psychiatry- alert and oriented to person, place and time, normal mood and affect   Labs reviewed: Basic Metabolic Panel:  Recent Labs  16/60/60 0500 04/01/12 0108  01/24/13 0313 01/25/13 0430 01/26/13 0540  NA 140 133*  < > 135* 135* 134*  K 4.1 3.9  < > 4.5 4.6 4.4  CL 99 92*  < > 95* 92* 93*  CO2 29 26  < > 28 24 26   GLUCOSE 158* 228*  < > 184* 166* 199*  BUN 40* 39*  < > 22 30* 32*  CREATININE 1.26* 1.35*  < > 1.24* 1.57* 1.31*  CALCIUM 9.3 9.8  < > 8.4 8.6 8.5  MG  --  2.2  --   --   --   --   PHOS  --  3.2  --   --   --   --   < > = values in this interval not displayed. Liver Function Tests:  Recent Labs  03/30/12 2116 03/31/12 0500 01/16/13 1147  AST 45* 54* 23  ALT 23 22 12   ALKPHOS 54 48 45  BILITOT 0.6 0.5 0.7  PROT 7.2 6.5 7.5  ALBUMIN 3.7 3.4* 3.8   No results found for this basename: LIPASE, AMYLASE,  in the last 8760 hours No results found for this basename: AMMONIA,  in the last 8760 hours CBC:  Recent Labs  01/16/13 1147  01/24/13 0313 01/25/13 0430 01/26/13 0540  WBC 9.7  < > 12.6* 14.6* 11.4*  NEUTROABS 7.1  --   --   --   --   HGB 12.9  < > 8.9* 9.5* 9.7*  HCT 39.2  < > 27.7* 29.5* 30.1*  MCV 98.7  < > 99.6 99.7 99.7  PLT 409*  < > 242 245 180  < > = values in this interval not displayed. Cardiac Enzymes:  Recent Labs  03/30/12 2116 03/31/12 0500 04/01/12 0104    TROPONINI 1.11* 7.82* 2.73*   BNP: No components found with this basename: POCBNP,  CBG:  Recent Labs  01/27/13 0438 01/27/13 0806 01/27/13 1148  GLUCAP 177* 162* 220*    Radiological Exams: Chest 2 View  01/16/2013   CLINICAL DATA:  Preop chest x-ray  EXAM: CHEST  2 VIEW  COMPARISON:  04/03/2012  FINDINGS: Bilateral mild interstitial thickening likely chronic. There is no focal parenchymal opacity, pleural effusion, or pneumothorax. Mild stable cardiomegaly. Prior CABG.  The osseous structures are unremarkable.  IMPRESSION: No active cardiopulmonary disease.   Electronically Signed   By: Elige Ko   On: 01/16/2013 14:12     Assessment/Plan  Right knee OA- s/p arthroplasty, to work with PT and OT, fall precautions, continue vicodin and prn robaxin.also to continue her cymbalta and neurontin for now. Continue coumadin for dvt prophylaxis and monitor inr for goal 2.5-3.5 with hx of MVR  Cellulitis- concerns for incision site infection with skin infection as well. Will have her started on bactrim DS 1tab q12h for a week and will follow with orthopedic next Monday. Will also have her on florastor. Monitor fever and wbc curve  Constipation- has regular bowel movement. Will not change her current bowel regimen  UTI- symptom free. Finished her keflex on 01/29/13. Encouraged hydration  Hyperlipidemia- continue lipitor 40 mg daily and fenofibrate 160 mg daily, monitor her  HTN- stable. Continue toprol xl 25 mg bid and cozaar 100 mg daily  CAD- remains chest pain free. Continue toprol, plavix, cozaar, prn NTG and lipitor  Mechanical valve replacement on coumadin therapy- stable, continue coumadin 2 mg daily, follow inr  Depression- mood is stable this visit. Continue cymbalta 60 mg daily   Heart failure- euvolemic at present on b blocker, cozaar, statin, lasix and kcl supplement. Continue bmp check. Monitor weight  Diabetes-   Lab Results  Component Value Date   HGBA1C 8.9*  01/23/2013   continue lantus 60 units daily and humalog 7 units prior to meals and will monitor cbg, keep glucose under control to help with wound healing as well   Family/ staff Communication: reviewed care plan with patient and nursing supervisor  Goals of care: STR   Labs/tests ordered: cbc with diff, bmp

## 2013-02-04 ENCOUNTER — Encounter (HOSPITAL_COMMUNITY): Payer: Self-pay | Admitting: Emergency Medicine

## 2013-02-04 ENCOUNTER — Inpatient Hospital Stay (HOSPITAL_COMMUNITY)
Admission: EM | Admit: 2013-02-04 | Discharge: 2013-02-09 | DRG: 377 | Disposition: A | Discharge status: Skilled Nursing Facility | Payer: Medicare Other | Attending: Pulmonary Disease | Admitting: Pulmonary Disease

## 2013-02-04 DIAGNOSIS — Z79899 Other long term (current) drug therapy: Secondary | ICD-10-CM

## 2013-02-04 DIAGNOSIS — J440 Chronic obstructive pulmonary disease with acute lower respiratory infection: Secondary | ICD-10-CM | POA: Diagnosis not present

## 2013-02-04 DIAGNOSIS — R451 Restlessness and agitation: Secondary | ICD-10-CM

## 2013-02-04 DIAGNOSIS — Z8249 Family history of ischemic heart disease and other diseases of the circulatory system: Secondary | ICD-10-CM

## 2013-02-04 DIAGNOSIS — Z8674 Personal history of sudden cardiac arrest: Secondary | ICD-10-CM

## 2013-02-04 DIAGNOSIS — R040 Epistaxis: Secondary | ICD-10-CM | POA: Diagnosis present

## 2013-02-04 DIAGNOSIS — I519 Heart disease, unspecified: Secondary | ICD-10-CM

## 2013-02-04 DIAGNOSIS — K259 Gastric ulcer, unspecified as acute or chronic, without hemorrhage or perforation: Secondary | ICD-10-CM

## 2013-02-04 DIAGNOSIS — Z800 Family history of malignant neoplasm of digestive organs: Secondary | ICD-10-CM

## 2013-02-04 DIAGNOSIS — G9349 Other encephalopathy: Secondary | ICD-10-CM | POA: Diagnosis not present

## 2013-02-04 DIAGNOSIS — IMO0001 Reserved for inherently not codable concepts without codable children: Secondary | ICD-10-CM | POA: Diagnosis present

## 2013-02-04 DIAGNOSIS — I255 Ischemic cardiomyopathy: Secondary | ICD-10-CM

## 2013-02-04 DIAGNOSIS — I509 Heart failure, unspecified: Secondary | ICD-10-CM | POA: Diagnosis present

## 2013-02-04 DIAGNOSIS — I472 Ventricular tachycardia: Secondary | ICD-10-CM

## 2013-02-04 DIAGNOSIS — E7800 Pure hypercholesterolemia, unspecified: Secondary | ICD-10-CM | POA: Diagnosis present

## 2013-02-04 DIAGNOSIS — I214 Non-ST elevation (NSTEMI) myocardial infarction: Secondary | ICD-10-CM

## 2013-02-04 DIAGNOSIS — Z9089 Acquired absence of other organs: Secondary | ICD-10-CM

## 2013-02-04 DIAGNOSIS — E1310 Other specified diabetes mellitus with ketoacidosis without coma: Secondary | ICD-10-CM | POA: Diagnosis present

## 2013-02-04 DIAGNOSIS — J9601 Acute respiratory failure with hypoxia: Secondary | ICD-10-CM

## 2013-02-04 DIAGNOSIS — Z951 Presence of aortocoronary bypass graft: Secondary | ICD-10-CM

## 2013-02-04 DIAGNOSIS — Z9861 Coronary angioplasty status: Secondary | ICD-10-CM

## 2013-02-04 DIAGNOSIS — K254 Chronic or unspecified gastric ulcer with hemorrhage: Principal | ICD-10-CM | POA: Diagnosis present

## 2013-02-04 DIAGNOSIS — Z8261 Family history of arthritis: Secondary | ICD-10-CM

## 2013-02-04 DIAGNOSIS — D62 Acute posthemorrhagic anemia: Secondary | ICD-10-CM | POA: Diagnosis present

## 2013-02-04 DIAGNOSIS — J40 Bronchitis, not specified as acute or chronic: Secondary | ICD-10-CM

## 2013-02-04 DIAGNOSIS — F32A Depression, unspecified: Secondary | ICD-10-CM

## 2013-02-04 DIAGNOSIS — Z87891 Personal history of nicotine dependence: Secondary | ICD-10-CM

## 2013-02-04 DIAGNOSIS — Q393 Congenital stenosis and stricture of esophagus: Secondary | ICD-10-CM

## 2013-02-04 DIAGNOSIS — N179 Acute kidney failure, unspecified: Secondary | ICD-10-CM

## 2013-02-04 DIAGNOSIS — I252 Old myocardial infarction: Secondary | ICD-10-CM

## 2013-02-04 DIAGNOSIS — J810 Acute pulmonary edema: Secondary | ICD-10-CM

## 2013-02-04 DIAGNOSIS — R578 Other shock: Secondary | ICD-10-CM

## 2013-02-04 DIAGNOSIS — I5189 Other ill-defined heart diseases: Secondary | ICD-10-CM

## 2013-02-04 DIAGNOSIS — Z794 Long term (current) use of insulin: Secondary | ICD-10-CM

## 2013-02-04 DIAGNOSIS — I4891 Unspecified atrial fibrillation: Secondary | ICD-10-CM | POA: Diagnosis not present

## 2013-02-04 DIAGNOSIS — D649 Anemia, unspecified: Secondary | ICD-10-CM

## 2013-02-04 DIAGNOSIS — Z954 Presence of other heart-valve replacement: Secondary | ICD-10-CM

## 2013-02-04 DIAGNOSIS — Z7902 Long term (current) use of antithrombotics/antiplatelets: Secondary | ICD-10-CM

## 2013-02-04 DIAGNOSIS — F329 Major depressive disorder, single episode, unspecified: Secondary | ICD-10-CM

## 2013-02-04 DIAGNOSIS — I4721 Torsades de pointes: Secondary | ICD-10-CM

## 2013-02-04 DIAGNOSIS — E1142 Type 2 diabetes mellitus with diabetic polyneuropathy: Secondary | ICD-10-CM | POA: Diagnosis present

## 2013-02-04 DIAGNOSIS — I248 Other forms of acute ischemic heart disease: Secondary | ICD-10-CM | POA: Diagnosis not present

## 2013-02-04 DIAGNOSIS — B9689 Other specified bacterial agents as the cause of diseases classified elsewhere: Secondary | ICD-10-CM

## 2013-02-04 DIAGNOSIS — E871 Hypo-osmolality and hyponatremia: Secondary | ICD-10-CM | POA: Diagnosis not present

## 2013-02-04 DIAGNOSIS — E1159 Type 2 diabetes mellitus with other circulatory complications: Secondary | ICD-10-CM

## 2013-02-04 DIAGNOSIS — R110 Nausea: Secondary | ICD-10-CM | POA: Diagnosis not present

## 2013-02-04 DIAGNOSIS — I5042 Chronic combined systolic (congestive) and diastolic (congestive) heart failure: Secondary | ICD-10-CM | POA: Diagnosis present

## 2013-02-04 DIAGNOSIS — Z96659 Presence of unspecified artificial knee joint: Secondary | ICD-10-CM

## 2013-02-04 DIAGNOSIS — R579 Shock, unspecified: Secondary | ICD-10-CM | POA: Diagnosis not present

## 2013-02-04 DIAGNOSIS — IMO0002 Reserved for concepts with insufficient information to code with codable children: Secondary | ICD-10-CM | POA: Diagnosis not present

## 2013-02-04 DIAGNOSIS — Z952 Presence of prosthetic heart valve: Secondary | ICD-10-CM

## 2013-02-04 DIAGNOSIS — I129 Hypertensive chronic kidney disease with stage 1 through stage 4 chronic kidney disease, or unspecified chronic kidney disease: Secondary | ICD-10-CM | POA: Diagnosis present

## 2013-02-04 DIAGNOSIS — G9340 Encephalopathy, unspecified: Secondary | ICD-10-CM

## 2013-02-04 DIAGNOSIS — Z8049 Family history of malignant neoplasm of other genital organs: Secondary | ICD-10-CM

## 2013-02-04 DIAGNOSIS — I739 Peripheral vascular disease, unspecified: Secondary | ICD-10-CM | POA: Diagnosis present

## 2013-02-04 DIAGNOSIS — Z7901 Long term (current) use of anticoagulants: Secondary | ICD-10-CM

## 2013-02-04 DIAGNOSIS — I5043 Acute on chronic combined systolic (congestive) and diastolic (congestive) heart failure: Secondary | ICD-10-CM

## 2013-02-04 DIAGNOSIS — N189 Chronic kidney disease, unspecified: Secondary | ICD-10-CM | POA: Diagnosis present

## 2013-02-04 DIAGNOSIS — J9600 Acute respiratory failure, unspecified whether with hypoxia or hypercapnia: Secondary | ICD-10-CM | POA: Diagnosis not present

## 2013-02-04 DIAGNOSIS — K922 Gastrointestinal hemorrhage, unspecified: Secondary | ICD-10-CM

## 2013-02-04 DIAGNOSIS — Z91013 Allergy to seafood: Secondary | ICD-10-CM

## 2013-02-04 DIAGNOSIS — I959 Hypotension, unspecified: Secondary | ICD-10-CM

## 2013-02-04 DIAGNOSIS — E1149 Type 2 diabetes mellitus with other diabetic neurological complication: Secondary | ICD-10-CM | POA: Diagnosis present

## 2013-02-04 DIAGNOSIS — E860 Dehydration: Secondary | ICD-10-CM

## 2013-02-04 DIAGNOSIS — I10 Essential (primary) hypertension: Secondary | ICD-10-CM

## 2013-02-04 DIAGNOSIS — I2489 Other forms of acute ischemic heart disease: Secondary | ICD-10-CM | POA: Diagnosis not present

## 2013-02-04 DIAGNOSIS — I2510 Atherosclerotic heart disease of native coronary artery without angina pectoris: Secondary | ICD-10-CM

## 2013-02-04 DIAGNOSIS — R739 Hyperglycemia, unspecified: Secondary | ICD-10-CM

## 2013-02-04 DIAGNOSIS — M1990 Unspecified osteoarthritis, unspecified site: Secondary | ICD-10-CM | POA: Diagnosis present

## 2013-02-04 DIAGNOSIS — Q391 Atresia of esophagus with tracheo-esophageal fistula: Secondary | ICD-10-CM

## 2013-02-04 DIAGNOSIS — J209 Acute bronchitis, unspecified: Secondary | ICD-10-CM | POA: Diagnosis not present

## 2013-02-04 DIAGNOSIS — K648 Other hemorrhoids: Secondary | ICD-10-CM | POA: Diagnosis present

## 2013-02-04 LAB — POCT I-STAT 3, VENOUS BLOOD GAS (G3P V)
Acid-base deficit: 2 mmol/L (ref 0.0–2.0)
BICARBONATE: 21.8 meq/L (ref 20.0–24.0)
O2 SAT: 25 %
PH VEN: 7.431 — AB (ref 7.250–7.300)
TCO2: 23 mmol/L (ref 0–100)
pCO2, Ven: 32.6 mmHg — ABNORMAL LOW (ref 45.0–50.0)
pO2, Ven: 16 mmHg — CL (ref 30.0–45.0)

## 2013-02-04 LAB — OCCULT BLOOD, POC DEVICE: FECAL OCCULT BLD: POSITIVE — AB

## 2013-02-04 LAB — CBC WITH DIFFERENTIAL/PLATELET
BASOS ABS: 0 10*3/uL (ref 0.0–0.1)
BASOS PCT: 0 % (ref 0–1)
Eosinophils Absolute: 0 10*3/uL (ref 0.0–0.7)
Eosinophils Relative: 0 % (ref 0–5)
HCT: 21.2 % — ABNORMAL LOW (ref 36.0–46.0)
HEMOGLOBIN: 7.2 g/dL — AB (ref 12.0–15.0)
Lymphocytes Relative: 11 % — ABNORMAL LOW (ref 12–46)
Lymphs Abs: 0.5 10*3/uL — ABNORMAL LOW (ref 0.7–4.0)
MCH: 31.7 pg (ref 26.0–34.0)
MCHC: 34 g/dL (ref 30.0–36.0)
MCV: 93.4 fL (ref 78.0–100.0)
MONOS PCT: 6 % (ref 3–12)
Monocytes Absolute: 0.2 10*3/uL (ref 0.1–1.0)
NEUTROS ABS: 3.4 10*3/uL (ref 1.7–7.7)
NEUTROS PCT: 83 % — AB (ref 43–77)
Platelets: 421 10*3/uL — ABNORMAL HIGH (ref 150–400)
RBC: 2.27 MIL/uL — ABNORMAL LOW (ref 3.87–5.11)
RDW: 14.7 % (ref 11.5–15.5)
WBC: 4.1 10*3/uL (ref 4.0–10.5)

## 2013-02-04 LAB — BASIC METABOLIC PANEL
BUN: 79 mg/dL — ABNORMAL HIGH (ref 6–23)
CHLORIDE: 90 meq/L — AB (ref 96–112)
CO2: 19 mEq/L (ref 19–32)
Calcium: 8.9 mg/dL (ref 8.4–10.5)
Creatinine, Ser: 1.36 mg/dL — ABNORMAL HIGH (ref 0.50–1.10)
GFR calc Af Amer: 44 mL/min — ABNORMAL LOW (ref >90)
GFR calc non Af Amer: 38 mL/min — ABNORMAL LOW (ref >90)
Glucose, Bld: 477 mg/dL — ABNORMAL HIGH (ref 70–99)
POTASSIUM: 5 meq/L (ref 3.7–5.3)
Sodium: 128 mEq/L — ABNORMAL LOW (ref 137–147)

## 2013-02-04 LAB — PREPARE RBC (CROSSMATCH)

## 2013-02-04 LAB — POCT I-STAT, CHEM 8
BUN: 84 mg/dL — AB (ref 6–23)
CREATININE: 1.5 mg/dL — AB (ref 0.50–1.10)
Calcium, Ion: 1.2 mmol/L (ref 1.13–1.30)
Chloride: 94 mEq/L — ABNORMAL LOW (ref 96–112)
Glucose, Bld: 475 mg/dL — ABNORMAL HIGH (ref 70–99)
HCT: 19 % — ABNORMAL LOW (ref 36.0–46.0)
Hemoglobin: 6.5 g/dL — CL (ref 12.0–15.0)
Potassium: 4.7 mEq/L (ref 3.7–5.3)
SODIUM: 127 meq/L — AB (ref 137–147)
TCO2: 21 mmol/L (ref 0–100)

## 2013-02-04 LAB — CG4 I-STAT (LACTIC ACID): Lactic Acid, Venous: 3.5 mmol/L — ABNORMAL HIGH (ref 0.5–2.2)

## 2013-02-04 LAB — GLUCOSE, CAPILLARY
GLUCOSE-CAPILLARY: 393 mg/dL — AB (ref 70–99)
Glucose-Capillary: 424 mg/dL — ABNORMAL HIGH (ref 70–99)

## 2013-02-04 LAB — PROTIME-INR
INR: 2.05 — ABNORMAL HIGH (ref 0.00–1.49)
PROTHROMBIN TIME: 22.5 s — AB (ref 11.6–15.2)

## 2013-02-04 MED ORDER — INSULIN REGULAR BOLUS VIA INFUSION
0.0000 [IU] | Freq: Three times a day (TID) | INTRAVENOUS | Status: DC
  Filled 2013-02-04: qty 10

## 2013-02-04 MED ORDER — DEXTROSE 50 % IV SOLN
25.0000 mL | INTRAVENOUS | Status: DC | PRN
  Administered 2013-02-05: 25 mL via INTRAVENOUS
  Filled 2013-02-04: qty 50

## 2013-02-04 MED ORDER — SODIUM CHLORIDE 0.9 % IV SOLN
8.0000 mg/h | INTRAVENOUS | Status: DC
  Administered 2013-02-04: 8 mg/h via INTRAVENOUS
  Filled 2013-02-04 (×4): qty 80

## 2013-02-04 MED ORDER — ATORVASTATIN CALCIUM 40 MG PO TABS
40.0000 mg | ORAL_TABLET | Freq: Every day | ORAL | Status: DC
  Administered 2013-02-04: 40 mg via ORAL
  Filled 2013-02-04: qty 1

## 2013-02-04 MED ORDER — SODIUM CHLORIDE 0.9 % IV BOLUS (SEPSIS)
1000.0000 mL | Freq: Once | INTRAVENOUS | Status: AC
  Administered 2013-02-04: 1000 mL via INTRAVENOUS

## 2013-02-04 MED ORDER — SODIUM CHLORIDE 0.9 % IV SOLN
INTRAVENOUS | Status: DC
  Administered 2013-02-04: 3.3 [IU]/h via INTRAVENOUS
  Administered 2013-02-06: 08:00:00 via INTRAVENOUS
  Filled 2013-02-04 (×3): qty 1

## 2013-02-04 MED ORDER — DEXTROSE-NACL 5-0.45 % IV SOLN
INTRAVENOUS | Status: DC
  Administered 2013-02-05: 150 mL via INTRAVENOUS
  Administered 2013-02-05: 01:00:00 via INTRAVENOUS

## 2013-02-04 MED ORDER — ONDANSETRON HCL 4 MG/2ML IJ SOLN
4.0000 mg | Freq: Once | INTRAMUSCULAR | Status: AC
  Administered 2013-02-04: 4 mg via INTRAVENOUS
  Filled 2013-02-04: qty 2

## 2013-02-04 MED ORDER — MORPHINE SULFATE 2 MG/ML IJ SOLN
2.0000 mg | Freq: Once | INTRAMUSCULAR | Status: AC
  Administered 2013-02-04: 2 mg via INTRAVENOUS
  Filled 2013-02-04: qty 1

## 2013-02-04 MED ORDER — SODIUM CHLORIDE 0.9 % IV SOLN
INTRAVENOUS | Status: DC
  Administered 2013-02-04: 150 mL/h via INTRAVENOUS

## 2013-02-04 MED ORDER — HYDROMORPHONE HCL PF 1 MG/ML IJ SOLN
1.0000 mg | Freq: Once | INTRAMUSCULAR | Status: AC
  Administered 2013-02-04: 1 mg via INTRAVENOUS
  Filled 2013-02-04: qty 1

## 2013-02-04 MED ORDER — GABAPENTIN 300 MG PO CAPS: 300.0000 mg | ORAL_CAPSULE | Freq: Three times a day (TID) | ORAL | Status: DC | PRN

## 2013-02-04 MED ORDER — SODIUM CHLORIDE 0.9 % IV SOLN
1000.0000 mL | Freq: Once | INTRAVENOUS | Status: AC
  Administered 2013-02-04: 1000 mL via INTRAVENOUS

## 2013-02-04 NOTE — ED Notes (Signed)
For 3-5 days having off and on nosebleeds, yesterday no issues. Today about 0330 started having diarrhea with bright red bloody clots in it. Weak, did not feeling like walking. Continued throughout the day and feeling worse throughout the day. Currently, feels tremendously weaker than she did yesterday or this morning. Family reports increased bruising also over the last week. Is on coumadin and plavix, and reports they have been checking INR, but she doesn't know the results. At [facility] for rehab following right total knee.

## 2013-02-04 NOTE — ED Provider Notes (Signed)
CSN: [id number]     Arrival date & time 02/04/13  1917 History   First MD Initiated Contact with Patient 02/04/13 2005     Chief Complaint  Patient presents with  . Rectal Bleeding   (Consider location/radiation/quality/duration/timing/severity/associated sxs/prior Treatment) HPI Comments: Patient is 73 year old female s/p right knee replacement 23 Jan 2013, who presents to the ED with a 12 hour history of lower GI bleed consisting intially of dark red diarrhea, but now with gelatinous clots and dark maroon stools.  She is on coumadin for a [employer] heart valve replacement about 20 years ago for which she is [facility].  Her daughter states that she initially had nose bleeds over the past 3 days, that we episodic and resolved spontaneously.  She does not know what her INR is.  She reports generalized weakness and shortness of breath today without dizziness, near syncope or syncope.  She denies chest pain, abdominal pain, nausea or vomiting.  She is followed for previous GIB in 2012 by [facility] which she states was due to gastric ulcer.   Patient is a 73 y.o. female presenting with hematochezia. The history is provided by the patient, a relative, the nursing home and medical records. No language interpreter was used.  Rectal Bleeding Quality:  Maroon Amount:  Moderate Duration:  12 hours Timing:  Constant Progression:  Worsening Chronicity:  Recurrent Context: diarrhea and spontaneously   Context: not anal fissures, not anal penetration, not constipation, not foreign body, not hemorrhoids, not rectal injury and not rectal pain   Similar prior episodes: yes   Relieved by:  Nothing Worsened by:  Nothing tried Ineffective treatments:  None tried Associated symptoms: dizziness, epistaxis and light-headedness   Associated symptoms: no abdominal pain, no fever, no hematemesis, no loss of consciousness and no vomiting   Risk factors: anticoagulant use     Past Medical History   Diagnosis Date  . High blood pressure   . Irregular heart rhythm   . High cholesterol   . Coronary artery disease   . COPD (chronic obstructive pulmonary disease)   . Anemia   . Irregular heart beat   . Myocardial infarction march 2013  . Peripheral vascular disease   . Cardiac arrest   . CHF (congestive heart failure) 01/06/12    Transthoracic Echocardiogram EF 45-50%  . Memory deficits 09/06/2012  . Polyneuropathy in diabetes(357.2)   . Degenerative arthritis   . Fibromyalgia   . Diabetes mellitus     Type II  . Heart murmur   . Angina at rest     no new symptoms since 11/08/12 (01/17/13)  . DM type 2 (diabetes mellitus, type 2)    Past Surgical History  Procedure Laterality Date  . [place]  2003  . Cardiac valve replacement  1991  . Heart bypass  1991/2005  . Angioplasty    . Coronary artery bypass graft    . Aortic valve replacement    . Cholecystectomy    . Esophagogastroduodenoscopy  03/31/2011    Procedure: ESOPHAGOGASTRODUODENOSCOPY (EGD);  Surgeon: Beverley FiedlerJay M Pyrtle, MD;  [facility];  Service: Gastroenterology;  Laterality: N/A;  . Bypass graft  1991 and 2005  . Toenail avulsion Left   . Total knee arthroplasty Right 01/23/2013    Procedure: RIGHT TOTAL KNEE ARTHROPLASTY;  Surgeon: Valeria BatmanPeter W Whitfield, MD;  [facility];  Service: Orthopedics;  Laterality: Right;   Family History  Problem Relation Age of Onset  .  Rheum arthritis Mother   . Cervical cancer Mother   . Stomach cancer Mother   . Cancer Mother   . Hyperlipidemia Mother   . Hypertension Mother   . Rheum arthritis Father   . Rheum arthritis Paternal Aunt   . Anesthesia problems Neg Hx   . Deep vein thrombosis Brother   . Hypertension Brother   . Hypertension Daughter    History  Substance Use Topics  . Smoking status: Former Smoker -- 0.50 packs/day for 15 years    Types: Cigarettes    Quit date: 01/18/1973  . Smokeless tobacco: Never Used  . Alcohol Use: No   OB History    Grav Para Term Preterm Abortions TAB SAB Ect Mult Living                 Review of Systems  Constitutional: Negative for fever.  HENT: Positive for nosebleeds.   Gastrointestinal: Positive for hematochezia. Negative for vomiting, abdominal pain and hematemesis.  Neurological: Positive for dizziness and light-headedness. Negative for loss of consciousness.  All other systems reviewed and are negative.    Allergies  Shellfish allergy and Codeine  Home Medications   Current Outpatient Rx  Name  Route  Sig  Dispense  Refill  . atorvastatin (LIPITOR) 40 MG tablet   Oral   Take 40 mg by mouth at bedtime. 9pm         . calcium carbonate (OS-CAL) 600 MG TABS   Oral   Take 600 mg by mouth 2 (two) times daily.          . clopidogrel (PLAVIX) 75 MG tablet   Oral   Take 1 tablet (75 mg total) by mouth daily with breakfast.   90 tablet   3   . docusate sodium 100 MG CAPS   Oral   Take 100 mg by mouth 2 (two) times daily.   10 capsule   0   . DULoxetine (CYMBALTA) 60 MG capsule   Oral   Take 60 mg by mouth daily at 12 noon.          . folic acid (FOLVITE) 400 MCG tablet   Oral   Take 400 mcg by mouth every evening. 5pm         . furosemide (LASIX) 40 MG tablet   Oral   Take 1 tablet (40 mg total) by mouth 2 (two) times daily.   60 tablet   3   . gabapentin (NEURONTIN) 300 MG capsule   Oral   Take 300 mg by mouth 3 (three) times daily.          Marland Kitchen HYDROcodone-acetaminophen (NORCO) 10-325 MG per tablet   Oral   Take 1 tablet by mouth every 6 (six) hours.   30 tablet   0   . insulin glargine (LANTUS) 100 UNIT/ML injection   Subcutaneous   Inject 60 Units into the skin daily with breakfast.         . insulin lispro (HUMALOG) 100 UNIT/ML injection   Subcutaneous   Inject 7 Units into the skin 3 (three) times daily with meals.         Marland Kitchen losartan (COZAAR) 100 MG tablet   Oral   Take 100 mg by mouth daily.         . metoprolol succinate (TOPROL-XL)  25 MG 24 hr tablet   Oral   Take 1 tablet (25 mg total) by mouth 2 (two) times daily.   180 tablet  3   . Multiple Vitamin (MULTIVITAMIN WITH MINERALS) TABS tablet   Oral   Take 1 tablet by mouth daily.         . nitroGLYCERIN (NITROSTAT) 0.4 MG SL tablet   Sublingual   Place 0.4 mg under the tongue every 5 (five) minutes as needed for chest pain.         . polyethylene glycol powder (GLYCOLAX/MIRALAX) powder   Oral   Take 17 g by mouth daily.   3350 g   1   . potassium chloride SA (K-DUR,KLOR-CON) 20 MEQ tablet   Oral   Take 20 mEq by mouth 3 (three) times a week. Monday, Wednesday, Friday - at 9am         . saccharomyces boulardii (FLORASTOR) 250 MG capsule   Oral   Take 250 mg by mouth 2 (two) times daily. Take for 2 weeks starting 02/02/13         . sulfamethoxazole-trimethoprim (BACTRIM DS) 800-160 MG per tablet   Oral   Take 1 tablet by mouth every 12 (twelve) hours. 7 day course started 02/03/13 (for cellulitis)         . vitamin B-12 (CYANOCOBALAMIN) 1000 MCG tablet   Oral   Take 1,000 mcg by mouth 2 (two) times daily.          Marland Kitchen warfarin (COUMADIN) 2 MG tablet   Oral   Take 2 mg by mouth at bedtime. 9pm         . enoxaparin (LOVENOX) 80 MG/0.8ML injection               . insulin glargine (LANTUS) 100 UNIT/ML injection   Subcutaneous   Inject 0.6 mLs (60 Units total) into the skin daily.   10 mL   11    BP 114/42  Temp(Src) 98.5 F (36.9 C) (Oral)  Resp 18  Ht 5\' 3"  (1.6 m)  SpO2 100% Physical Exam  Nursing note and vitals reviewed. Constitutional: She is oriented to person, place, and time. She appears well-developed and well-nourished.  Pale and sickly in appearance  HENT:  Head: Normocephalic and atraumatic.  Right Ear: External ear normal.  Left Ear: External ear normal.  Nose: Nose normal.  Mouth/Throat: No oropharyngeal exudate.  Dry oral mucosa  Eyes: Pupils are equal, round, and reactive to light. No scleral icterus.   Pale conjunctiva  Neck: Normal range of motion. Neck supple.  Cardiovascular: Normal rate, regular rhythm and normal heart sounds.  Exam reveals no gallop and no friction rub.   No murmur heard. Pulmonary/Chest: Effort normal and breath sounds normal. No respiratory distress. She has no wheezes. She has no rales. She exhibits no tenderness.  Abdominal: Soft. Bowel sounds are normal. She exhibits no distension. There is tenderness. There is no rebound and no guarding.  Mild lower abdominal tenderness to palpation - multiple bruises noted to lower abdomen.  Musculoskeletal: Normal range of motion. She exhibits tenderness. She exhibits no edema.  Right knee with intact staples, wound without erythema, drainage.  Lymphadenopathy:    She has no cervical adenopathy.  Neurological: She is alert and oriented to person, place, and time. She exhibits normal muscle tone. Coordination normal.  Skin: Skin is warm and dry. No rash noted. No erythema. There is pallor.  Psychiatric: She has a normal mood and affect. Her behavior is normal. Judgment and thought content normal.    ED Course  Procedures (including critical care time) Labs Review Labs Reviewed  GLUCOSE, CAPILLARY -  Abnormal; Notable for the following:    Glucose-Capillary 424 (*)    All other components within normal limits  CBC WITH DIFFERENTIAL - Abnormal; Notable for the following:    RBC 2.27 (*)    Hemoglobin 7.2 (*)    HCT 21.2 (*)    Platelets 421 (*)    Neutrophils Relative % 83 (*)    Lymphocytes Relative 11 (*)    Lymphs Abs 0.5 (*)    All other components within normal limits  POCT I-STAT, CHEM 8 - Abnormal; Notable for the following:    Sodium 127 (*)    Chloride 94 (*)    BUN 84 (*)    Creatinine, Ser 1.50 (*)    Glucose, Bld 475 (*)    Hemoglobin 6.5 (*)    HCT 19.0 (*)    All other components within normal limits  BASIC METABOLIC PANEL  PROTIME-INR  BLOOD GAS, VENOUS  TYPE AND SCREEN   Imaging Review No  results found.  EKG Interpretation   None      Results for orders placed during the hospital encounter of 02/04/13  GLUCOSE, CAPILLARY      Result Value Range   Glucose-Capillary 424 (*) 70 - 99 mg/dL  CBC WITH DIFFERENTIAL      Result Value Range   WBC 4.1  4.0 - 10.5 K/uL   RBC 2.27 (*) 3.87 - 5.11 MIL/uL   Hemoglobin 7.2 (*) 12.0 - 15.0 g/dL   HCT 36.6 (*) 29.4 - 76.5 %   MCV 93.4  78.0 - 100.0 fL   MCH 31.7  26.0 - 34.0 pg   MCHC 34.0  30.0 - 36.0 g/dL   RDW 46.5  03.5 - 46.5 %   Platelets 421 (*) 150 - 400 K/uL   Neutrophils Relative % 83 (*) 43 - 77 %   Neutro Abs 3.4  1.7 - 7.7 K/uL   Lymphocytes Relative 11 (*) 12 - 46 %   Lymphs Abs 0.5 (*) 0.7 - 4.0 K/uL   Monocytes Relative 6  3 - 12 %   Monocytes Absolute 0.2  0.1 - 1.0 K/uL   Eosinophils Relative 0  0 - 5 %   Eosinophils Absolute 0.0  0.0 - 0.7 K/uL   Basophils Relative 0  0 - 1 %   Basophils Absolute 0.0  0.0 - 0.1 K/uL  BASIC METABOLIC PANEL      Result Value Range   Sodium 128 (*) 137 - 147 mEq/L   Potassium 5.0  3.7 - 5.3 mEq/L   Chloride 90 (*) 96 - 112 mEq/L   CO2 19  19 - 32 mEq/L   Glucose, Bld 477 (*) 70 - 99 mg/dL   BUN 79 (*) 6 - 23 mg/dL   Creatinine, Ser 6.81 (*) 0.50 - 1.10 mg/dL   Calcium 8.9  8.4 - 27.5 mg/dL   GFR calc non Af Amer 38 (*) >90 mL/min   GFR calc Af Amer 44 (*) >90 mL/min  PROTIME-INR      Result Value Range   Prothrombin Time 22.5 (*) 11.6 - 15.2 seconds   INR 2.05 (*) 0.00 - 1.49  POCT I-STAT, CHEM 8      Result Value Range   Sodium 127 (*) 137 - 147 mEq/L   Potassium 4.7  3.7 - 5.3 mEq/L   Chloride 94 (*) 96 - 112 mEq/L   BUN 84 (*) 6 - 23 mg/dL   Creatinine, Ser 1.70 (*) 0.50 - 1.10 mg/dL  Glucose, Bld 475 (*) 70 - 99 mg/dL   Calcium, Ion 8.27  0.78 - 1.30 mmol/L   TCO2 21  0 - 100 mmol/L   Hemoglobin 6.5 (*) 12.0 - 15.0 g/dL   HCT 67.5 (*) 44.9 - 20.1 %   Comment NOTIFIED PHYSICIAN    OCCULT BLOOD, POC DEVICE      Result Value Range   Fecal Occult Bld  POSITIVE (*) NEGATIVE  POCT I-STAT 3, BLOOD GAS (G3P V)      Result Value Range   pH, Ven 7.431 (*) 7.250 - 7.300   pCO2, Ven 32.6 (*) 45.0 - 50.0 mmHg   pO2, Ven 16.0 (*) 30.0 - 45.0 mmHg   Bicarbonate 21.8  20.0 - 24.0 mEq/L   TCO2 23  0 - 100 mmol/L   O2 Saturation 25.0     Acid-base deficit 2.0  0.0 - 2.0 mmol/L   Patient temperature 97.7 F     Sample type VENOUS     Comment NOTIFIED PHYSICIAN    CG4 I-STAT (LACTIC ACID)      Result Value Range   Lactic Acid, Venous 3.50 (*) 0.5 - 2.2 mmol/L   Chest 2 View  01/16/2013   CLINICAL DATA:  Preop chest x-ray  EXAM: CHEST  2 VIEW  COMPARISON:  04/03/2012  FINDINGS: Bilateral mild interstitial thickening likely chronic. There is no focal parenchymal opacity, pleural effusion, or pneumothorax. Mild stable cardiomegaly. Prior CABG.  The osseous structures are unremarkable.  IMPRESSION: No active cardiopulmonary disease.   Electronically Signed   By: Elige Ko   On: 01/16/2013 14:12   CRITICAL CARE Performed by: Patrecia Pour. Total critical care time: 45 Critical care time was exclusive of separately billable procedures and treating other patients. Critical care was necessary to treat or prevent imminent or life-threatening deterioration. Critical care was time spent personally by me on the following activities: development of treatment plan with patient and/or surrogate as well as nursing, discussions with consultants, evaluation of patient's response to treatment, examination of patient, obtaining history from patient or surrogate, ordering and performing treatments and interventions, ordering and review of laboratory studies, ordering and review of radiographic studies, pulse oximetry and re-evaluation of patient's condition.    MDM  Lower GI Bleed Hyperglycemia Anemia  Patient is 73 year old female s/p knee replacement on coumadin and plavix who presents with a day history of lower GI bleed.  I have spoken with Dr. Christella Hartigan with  GI and Dr. Manus Gunning has spoken with PCCM about this patient and we will get her admitted to them.  Patient is noted with HGB of 7.2, CBG of 424, not acidotic but with lactate of 3.5.  She is also noted to be above her baseline in terms of BUN and Cr.   We have ordered a liter of fluids and blood transfusions, have started the patient on protonix drip and may also begin the glucose stabilizer as well.     Izola Price Marisue Humble, [place] 02/04/13 2104

## 2013-02-04 NOTE — ED Notes (Signed)
Dr Manus Gunning given a copy of lactic acid results 3.50 and venous blood gas

## 2013-02-04 NOTE — H&P (Signed)
Name: [name] MRN: [id number] DOB: 03-02-1940    ADMISSION DATE:  02/04/2013  REFERRING MD :  EDP PRIMARY SERVICE: PCCM  CHIEF COMPLAINT:  GI bleed  BRIEF PATIENT DESCRIPTION: 73 yo woman, history of mechanical heart valve on coumadin, prior upper GI bleed 2012, presents with a 1-day history of black, tarry stools, which progressed to BRBPR, admitted for acute GI bleed.  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  Around 3:30 AM on the day of admission, the patient started having bowel movements, initially with dark, tarry stools, which progressed to BRBPR with dark clots, with about 7 BM's over the course of the day.  The patient notes associated generalized weakness, and mild SOB.  The patient is s/p R knee replacement 01/23/13, currently taking tylenol, tramadol, and hydrocodone for pain.  She denies NSAID usage, alcohol usage, or tobacco use.  She notes no recent heartburn, abdominal pain, nausea, vomiting, diarrhea, constipation, chest pain.  Of note, the patient was also found to be hyperglycemic on admission, with CBG's in the upper 400's, and there is some debate between the patient and family regarding the details of her insulin regimen.  No blurry vision, polyuria, polydipsia.  PAST MEDICAL HISTORY :  Past Medical History  Diagnosis Date  . High blood pressure   . Irregular heart rhythm   . High cholesterol   . Coronary artery disease   . COPD (chronic obstructive pulmonary disease)   . Anemia   . Irregular heart beat   . Myocardial infarction march 2013  . Peripheral vascular disease   . Cardiac arrest   . CHF (congestive heart  failure) 01/06/12    Transthoracic Echocardiogram EF 45-50%  . Memory deficits 09/06/2012  . Polyneuropathy in diabetes(357.2)   . Degenerative arthritis   . Fibromyalgia   . Diabetes mellitus     Type II  . Heart murmur   . Angina at rest     no new symptoms since 11/08/12 (01/17/13)  . DM type 2 (diabetes mellitus, type 2)    Past Surgical History  Procedure Laterality Date  . Gallbadder  2003  . Cardiac valve replacement  1991  . Heart bypass  1991/2005  . Angioplasty    . Coronary artery bypass graft    . Aortic valve replacement    . Cholecystectomy    . Esophagogastroduodenoscopy  03/31/2011    Procedure: ESOPHAGOGASTRODUODENOSCOPY (EGD);  Surgeon: Beverley Fiedler, MD;  Location: [facility] ENDOSCOPY;  Service: Gastroenterology;  Laterality: N/A;  . Bypass graft  1991 and 2005  . Toenail avulsion Left   . Total knee arthroplasty Right 01/23/2013    Procedure: RIGHT TOTAL KNEE ARTHROPLASTY;  Surgeon: Valeria Batman, MD;  Location: [facility] OR;  Service: Orthopedics;  Laterality: Right;   Prior to Admission medications   Medication Sig Start Date End Date Taking? Authorizing Provider  atorvastatin (LIPITOR) 40 MG tablet Take 40 mg by mouth at bedtime. 9pm   Yes Historical Provider, MD  calcium carbonate (OS-CAL) 600 MG TABS Take 600 mg by mouth 2 (two) times daily.    Yes Historical Provider, MD  clopidogrel (  PLAVIX) 75 MG tablet Take 1 tablet (75 mg total) by mouth daily with breakfast. 07/24/12  Yes Mihai Croitoru, MD  docusate sodium 100 MG CAPS Take 100 mg by mouth 2 (two) times daily. 01/25/13  Yes [name], PA-C  DULoxetine (CYMBALTA) 60 MG capsule Take 60 mg by mouth daily at 12 noon.    Yes Historical Provider, MD  folic acid (FOLVITE) 400 MCG tablet Take 400 mcg by mouth every evening. 5pm   Yes Historical Provider, MD  furosemide (LASIX) 40 MG tablet Take 1 tablet (40 mg total) by mouth 2 (two) times daily. 06/14/12  Yes Wilburt Finlay, PA-C  gabapentin (NEURONTIN) 300 MG capsule Take  300 mg by mouth 3 (three) times daily.    Yes Historical Provider, MD  HYDROcodone-acetaminophen (NORCO) 10-325 MG per tablet Take 1 tablet by mouth every 6 (six) hours. 01/30/13  Yes Sharee Holster, NP  insulin glargine (LANTUS) 100 UNIT/ML injection Inject 60 Units into the skin daily with breakfast.   Yes Historical Provider, MD  insulin lispro (HUMALOG) 100 UNIT/ML injection Inject 7 Units into the skin 3 (three) times daily with meals.   Yes Historical Provider, MD  losartan (COZAAR) 100 MG tablet Take 100 mg by mouth daily.   Yes Historical Provider, MD  metoprolol succinate (TOPROL-XL) 25 MG 24 hr tablet Take 1 tablet (25 mg total) by mouth 2 (two) times daily. 07/24/12  Yes Mihai Croitoru, MD  Multiple Vitamin (MULTIVITAMIN WITH MINERALS) TABS tablet Take 1 tablet by mouth daily.   Yes Historical Provider, MD  nitroGLYCERIN (NITROSTAT) 0.4 MG SL tablet Place 0.4 mg under the tongue every 5 (five) minutes as needed for chest pain.   Yes Historical Provider, MD  polyethylene glycol powder (GLYCOLAX/MIRALAX) powder Take 17 g by mouth daily. 01/30/13  Yes Sharee Holster, NP  potassium chloride SA (K-DUR,KLOR-CON) 20 MEQ tablet Take 20 mEq by mouth 3 (three) times a week. Monday, Wednesday, Friday - at 9am   Yes Historical Provider, MD  saccharomyces boulardii (FLORASTOR) 250 MG capsule Take 250 mg by mouth 2 (two) times daily. Take for 2 weeks starting 02/02/13   Yes Historical Provider, MD  sulfamethoxazole-trimethoprim (BACTRIM DS) 800-160 MG per tablet Take 1 tablet by mouth every 12 (twelve) hours. 7 day course started 02/03/13 (for cellulitis)   Yes Historical Provider, MD  vitamin B-12 (CYANOCOBALAMIN) 1000 MCG tablet Take 1,000 mcg by mouth 2 (two) times daily.    Yes Historical Provider, MD  warfarin (COUMADIN) 2 MG tablet Take 2 mg by mouth at bedtime. 9pm   Yes Historical Provider, MD   Allergies  Allergen Reactions  . Shellfish Allergy Anaphylaxis    Not allergic to betadine  . Codeine  Other (See Comments)    Feels sick    FAMILY HISTORY:  Family History  Problem Relation Age of Onset  . Rheum arthritis Mother   . Cervical cancer Mother   . Stomach cancer Mother   . Cancer Mother   . Hyperlipidemia Mother   . Hypertension Mother   . Rheum arthritis Father   . Rheum arthritis Paternal Aunt   . Anesthesia problems Neg Hx   . Deep vein thrombosis Brother   . Hypertension Brother   . Hypertension Daughter    SOCIAL HISTORY:  reports that she quit smoking about 40 years ago. Her smoking use included Cigarettes. She has a 7.5 pack-year smoking history. She has never used smokeless tobacco. She reports that she does not drink alcohol or  use illicit drugs.  REVIEW OF SYSTEMS:   General: no fevers, chills, changes in weight, changes in appetite Skin: R knee wound clean/dry HEENT: no blurry vision, hearing changes, sore throat Pulm: no coughing, wheezing CV: no chest pain, palpitations Abd: see HPI GU: no dysuria, hematuria, polyuria Ext: +recent right knee surgery Neuro: no weakness, numbness, or tingling  SUBJECTIVE:   VITAL SIGNS: Temp:  [98 F (36.7 C)-98.6 F (37 C)] 98 F (36.7 C) (01/18 2128) Pulse Rate:  [76-85] 76 (01/18 2158) Resp:  [16-18] 16 (01/18 2158) BP: (92-114)/(36-42) 100/36 mmHg (01/18 2158) SpO2:  [97 %-100 %] 97 % (01/18 2158) HEMODYNAMICS:   VENTILATOR SETTINGS:   INTAKE / OUTPUT: Intake/Output     01/18 0701 - 01/19 0700   Blood 325.5   Total Intake 325.5   Net +325.5         PHYSICAL EXAMINATION: General:  Lying in bed, appears fatigued, but able to answer questions and follow commands Neuro:  A&O x3, drowsy but arouseable, moves all 4 extremities spontaneously HEENT:  PERRL, EOMI, oropharynx non-erythematous Cardiovascular:  Borderline tachycardic, regular rhythm, +murmur Lungs:  CTAB, no wheezes or ronchi Abdomen:  Soft, mild suprapubic ttp, non-distended, +bs, no rebound or guarding Musculoskeletal:  R knee  clean/dry/intact without significant erythema Skin:  intact  LABS:  CBC  Recent Labs Lab 02/04/13 1955 02/04/13 2011  WBC 4.1  --   HGB 7.2* 6.5*  HCT 21.2* 19.0*  PLT 421*  --    Coag's  Recent Labs Lab 02/04/13 1955  INR 2.05*   BMET  Recent Labs Lab 02/04/13 1955 02/04/13 2011  NA 128* 127*  K 5.0 4.7  CL 90* 94*  CO2 19  --   BUN 79* 84*  CREATININE 1.36* 1.50*  GLUCOSE 477* 475*   Electrolytes  Recent Labs Lab 02/04/13 1955  CALCIUM 8.9   Sepsis Markers  Recent Labs Lab 02/04/13 2044  LATICACIDVEN 3.50*   ABG No results found for this basename: PHART, PCO2ART, PO2ART,  in the last 168 hours Liver Enzymes No results found for this basename: AST, ALT, ALKPHOS, BILITOT, ALBUMIN,  in the last 168 hours Cardiac Enzymes No results found for this basename: TROPONINI, PROBNP,  in the last 168 hours Glucose  Recent Labs Lab 02/04/13 1933  GLUCAP 424*    Imaging No results found.   CXR: none  ASSESSMENT / PLAN:  PULMONARY A: History of COPD - currently satting well on room air P:   -continue to monitor O2 sats, given high volumes of IVF given and history of CHF  CARDIOVASCULAR A: CHF - EF 35-40% HTN - BP currently low/normal CAD - MI in 03/2012, still on plavix, coumadin St Jude Valve - on chronic coumadin P:  -hold metoprolol, losartan, lasix, given low/normal BP and acute bleed -hold plavix, given acute bleed -hold coumadin until GI bleed stabilized -give 2 U FFP to reverse coumadin.  Avoid vit K -recheck INR in AM  RENAL A:  CKD - CR = 1.36, around baseline Elevated AG metabolic acidosis - likely combination of DKA and lactic acidosis P:   -repeat lactic acid in AM -give IVF, blood transfusion -start insulin drip  GASTROINTESTINAL A:  Acute GI bleed - unclear upper vs lower, but given dark -> bright stools and history of UGI bleed, favor UGI source P:   -GI consulted, pt may need emergent EGD/colonoscopy overnight -NPO  in case of procedure  HEMATOLOGIC A:  Acute blood loss anemia - s/p 2  U pRBC's Anticoagulation - on coumadin for mechanical heart valve P:  -ordered 2 units pRBC's, 2 units FFP -SCD's for DVT ppx  INFECTIOUS A:  ?R knee infection - reportedly R knee was erythematous on the day prior to admission.  Started Bactrim at [facility] on 1/17.  Knee currently looks great, with no leukocytosis. P:   -continue bactrim for full course of treatment  ENDOCRINE A:  Diabetes Mellitus type 2 P:   -insulin drip for DKA -CBG's q1hr and BMET q4 while on insulin drip for DKA -restart Lantus and mealtime insulin once off drip  NEUROLOGIC A: Mildly drowsy P:   -continue to monitor  TODAY'S SUMMARY: Admitted for acute GI bleed, getting 2 U pRBC's and 2 units FFP, GI consult pending.  Janalyn Harder, PGY3 Pgr. [phone] 02/04/2013, 10:32 PM   I have personally obtained a history, examined the patient, evaluated laboratory and imaging results, formulated the assessment and plan and placed orders. CRITICAL CARE: The patient is critically ill with multiple organ systems failure and requires high complexity decision making for assessment and support, frequent evaluation and titration of therapies, application of advanced monitoring technologies and extensive interpretation of multiple databases. Critical Care Time devoted to patient care services described in this note is 45 minutes.   Overton Mam, M.D. Pulmonary and Critical Care Medicine [facility] Pager: [phone]

## 2013-02-04 NOTE — ED Notes (Signed)
Patient placed on o2@ 2lpm

## 2013-02-05 ENCOUNTER — Encounter (HOSPITAL_COMMUNITY)
Admission: EM | Disposition: A | Discharge status: Skilled Nursing Facility | Payer: Self-pay | Source: Home / Self Care | Attending: Pulmonary Disease

## 2013-02-05 ENCOUNTER — Encounter (HOSPITAL_COMMUNITY): Payer: Self-pay

## 2013-02-05 ENCOUNTER — Inpatient Hospital Stay (HOSPITAL_COMMUNITY): Payer: Medicare Other

## 2013-02-05 DIAGNOSIS — Z7901 Long term (current) use of anticoagulants: Secondary | ICD-10-CM

## 2013-02-05 DIAGNOSIS — G9340 Encephalopathy, unspecified: Secondary | ICD-10-CM

## 2013-02-05 DIAGNOSIS — Z954 Presence of other heart-valve replacement: Secondary | ICD-10-CM

## 2013-02-05 DIAGNOSIS — I959 Hypotension, unspecified: Secondary | ICD-10-CM

## 2013-02-05 DIAGNOSIS — K922 Gastrointestinal hemorrhage, unspecified: Secondary | ICD-10-CM

## 2013-02-05 DIAGNOSIS — J9601 Acute respiratory failure with hypoxia: Secondary | ICD-10-CM

## 2013-02-05 DIAGNOSIS — J9600 Acute respiratory failure, unspecified whether with hypoxia or hypercapnia: Secondary | ICD-10-CM

## 2013-02-05 DIAGNOSIS — D649 Anemia, unspecified: Secondary | ICD-10-CM

## 2013-02-05 DIAGNOSIS — I2510 Atherosclerotic heart disease of native coronary artery without angina pectoris: Secondary | ICD-10-CM

## 2013-02-05 HISTORY — PX: ESOPHAGOGASTRODUODENOSCOPY: SHX5428

## 2013-02-05 LAB — BASIC METABOLIC PANEL
BUN: 68 mg/dL — AB (ref 6–23)
BUN: 56 mg/dL — ABNORMAL HIGH (ref 6–23)
BUN: 55 mg/dL — ABNORMAL HIGH (ref 6–23)
CALCIUM: 8 mg/dL — AB (ref 8.4–10.5)
CALCIUM: 7.5 mg/dL — AB (ref 8.4–10.5)
CO2: 22 mEq/L (ref 19–32)
CO2: 21 mEq/L (ref 19–32)
CO2: 19 meq/L (ref 19–32)
CREATININE: 1.22 mg/dL — AB (ref 0.50–1.10)
Calcium: 8.2 mg/dL — ABNORMAL LOW (ref 8.4–10.5)
Chloride: 103 mEq/L (ref 96–112)
Chloride: 105 mEq/L (ref 96–112)
Chloride: 106 mEq/L (ref 96–112)
Creatinine, Ser: 1.08 mg/dL (ref 0.50–1.10)
Creatinine, Ser: 1.09 mg/dL (ref 0.50–1.10)
GFR calc Af Amer: 58 mL/min — ABNORMAL LOW (ref >90)
GFR calc Af Amer: 57 mL/min — ABNORMAL LOW (ref >90)
GFR calc non Af Amer: 43 mL/min — ABNORMAL LOW (ref >90)
GFR calc non Af Amer: 49 mL/min — ABNORMAL LOW (ref >90)
GFR, EST AFRICAN AMERICAN: 50 mL/min — AB (ref >90)
GFR, EST NON AFRICAN AMERICAN: 50 mL/min — AB (ref >90)
Glucose, Bld: 290 mg/dL — ABNORMAL HIGH (ref 70–99)
Glucose, Bld: 216 mg/dL — ABNORMAL HIGH (ref 70–99)
Glucose, Bld: 203 mg/dL — ABNORMAL HIGH (ref 70–99)
POTASSIUM: 3.7 meq/L (ref 3.7–5.3)
Potassium: 4.9 mEq/L (ref 3.7–5.3)
Potassium: 4.1 mEq/L (ref 3.7–5.3)
SODIUM: 142 meq/L (ref 137–147)
SODIUM: 141 meq/L (ref 137–147)
Sodium: 136 mEq/L — ABNORMAL LOW (ref 137–147)

## 2013-02-05 LAB — POCT I-STAT 3, ART BLOOD GAS (G3+)
ACID-BASE DEFICIT: 8 mmol/L — AB (ref 0.0–2.0)
BICARBONATE: 20.9 meq/L (ref 20.0–24.0)
O2 Saturation: 99 %
PO2 ART: 188 mmHg — AB (ref 80.0–100.0)
Patient temperature: 98.7
TCO2: 23 mmol/L (ref 0–100)
pCO2 arterial: 59.5 mmHg (ref 35.0–45.0)
pH, Arterial: 7.154 — CL (ref 7.350–7.450)

## 2013-02-05 LAB — CBC
HCT: 33.5 % — ABNORMAL LOW (ref 36.0–46.0)
HEMATOCRIT: 16.5 % — AB (ref 36.0–46.0)
Hemoglobin: 5.6 g/dL — CL (ref 12.0–15.0)
Hemoglobin: 11.7 g/dL — ABNORMAL LOW (ref 12.0–15.0)
MCH: 30.4 pg (ref 26.0–34.0)
MCH: 30.9 pg (ref 26.0–34.0)
MCHC: 33.9 g/dL (ref 30.0–36.0)
MCHC: 34.9 g/dL (ref 30.0–36.0)
MCV: 89.7 fL (ref 78.0–100.0)
MCV: 88.4 fL (ref 78.0–100.0)
PLATELETS: 203 10*3/uL (ref 150–400)
Platelets: 369 10*3/uL (ref 150–400)
RBC: 1.84 MIL/uL — ABNORMAL LOW (ref 3.87–5.11)
RBC: 3.79 MIL/uL — AB (ref 3.87–5.11)
RDW: 15.8 % — AB (ref 11.5–15.5)
RDW: 16.3 % — ABNORMAL HIGH (ref 11.5–15.5)
WBC: 4.1 10*3/uL (ref 4.0–10.5)
WBC: 30 10*3/uL — ABNORMAL HIGH (ref 4.0–10.5)

## 2013-02-05 LAB — GLUCOSE, CAPILLARY
GLUCOSE-CAPILLARY: 103 mg/dL — AB (ref 70–99)
GLUCOSE-CAPILLARY: 132 mg/dL — AB (ref 70–99)
GLUCOSE-CAPILLARY: 186 mg/dL — AB (ref 70–99)
GLUCOSE-CAPILLARY: 221 mg/dL — AB (ref 70–99)
GLUCOSE-CAPILLARY: 274 mg/dL — AB (ref 70–99)
GLUCOSE-CAPILLARY: 340 mg/dL — AB (ref 70–99)
GLUCOSE-CAPILLARY: 348 mg/dL — AB (ref 70–99)
GLUCOSE-CAPILLARY: 98 mg/dL (ref 70–99)
Glucose-Capillary: 236 mg/dL — ABNORMAL HIGH (ref 70–99)
Glucose-Capillary: 183 mg/dL — ABNORMAL HIGH (ref 70–99)
Glucose-Capillary: 155 mg/dL — ABNORMAL HIGH (ref 70–99)
Glucose-Capillary: 117 mg/dL — ABNORMAL HIGH (ref 70–99)
Glucose-Capillary: 141 mg/dL — ABNORMAL HIGH (ref 70–99)
Glucose-Capillary: 119 mg/dL — ABNORMAL HIGH (ref 70–99)
Glucose-Capillary: 125 mg/dL — ABNORMAL HIGH (ref 70–99)
Glucose-Capillary: 229 mg/dL — ABNORMAL HIGH (ref 70–99)
Glucose-Capillary: 59 mg/dL — ABNORMAL LOW (ref 70–99)

## 2013-02-05 LAB — PREPARE RBC (CROSSMATCH)

## 2013-02-05 LAB — PROTIME-INR
INR: 1.75 — AB (ref 0.00–1.49)
Prothrombin Time: 19.9 seconds — ABNORMAL HIGH (ref 11.6–15.2)

## 2013-02-05 LAB — LACTIC ACID, PLASMA
Lactic Acid, Venous: 1.8 mmol/L (ref 0.5–2.2)
Lactic Acid, Venous: 3.3 mmol/L — ABNORMAL HIGH (ref 0.5–2.2)

## 2013-02-05 LAB — PROCALCITONIN: Procalcitonin: 0.38 ng/mL

## 2013-02-05 LAB — TROPONIN I: Troponin I: >20 ng/mL (ref <0.30)

## 2013-02-05 LAB — PRO B NATRIURETIC PEPTIDE: PRO B NATRI PEPTIDE: 11162 pg/mL — AB (ref 0–125)

## 2013-02-05 LAB — PHOSPHORUS: PHOSPHORUS: 4.3 mg/dL (ref 2.3–4.6)

## 2013-02-05 LAB — MAGNESIUM: MAGNESIUM: 1.9 mg/dL (ref 1.5–2.5)

## 2013-02-05 SURGERY — EGD (ESOPHAGOGASTRODUODENOSCOPY)
Anesthesia: Moderate Sedation

## 2013-02-05 MED ORDER — ETOMIDATE 2 MG/ML IV SOLN: 20.0000 mg | INTRAVENOUS | Status: DC

## 2013-02-05 MED ORDER — HYDROMORPHONE HCL PF 1 MG/ML IJ SOLN
INTRAMUSCULAR | Status: AC
  Filled 2013-02-05: qty 1

## 2013-02-05 MED ORDER — NOREPINEPHRINE BITARTRATE 1 MG/ML IJ SOLN
2.0000 ug/min | INTRAVENOUS | Status: DC
  Administered 2013-02-06: 5 ug/min via INTRAVENOUS
  Administered 2013-02-06: 10 ug/min via INTRAVENOUS
  Administered 2013-02-07: 8 ug/min via INTRAVENOUS
  Filled 2013-02-05 (×4): qty 4

## 2013-02-05 MED ORDER — ALBUTEROL SULFATE (2.5 MG/3ML) 0.083% IN NEBU
2.5000 mg | INHALATION_SOLUTION | Freq: Once | RESPIRATORY_TRACT | Status: AC
  Administered 2013-02-05: 2.5 mg via RESPIRATORY_TRACT

## 2013-02-05 MED ORDER — SULFAMETHOXAZOLE-TRIMETHOPRIM 400-80 MG/5ML IV SOLN
320.0000 mg | Freq: Three times a day (TID) | INTRAVENOUS | Status: DC
  Administered 2013-02-06 – 2013-02-07 (×5): 320 mg via INTRAVENOUS
  Filled 2013-02-05 (×10): qty 20

## 2013-02-05 MED ORDER — MIDAZOLAM HCL 2 MG/2ML IJ SOLN
1.0000 mg | INTRAMUSCULAR | Status: DC | PRN
  Administered 2013-02-05: 16:00:00 via INTRAVENOUS
  Administered 2013-02-05: 2 mg via INTRAVENOUS
  Filled 2013-02-05: qty 2

## 2013-02-05 MED ORDER — MIDAZOLAM HCL 2 MG/2ML IJ SOLN
INTRAMUSCULAR | Status: AC
  Filled 2013-02-05: qty 4

## 2013-02-05 MED ORDER — SODIUM CHLORIDE 0.9 % IV SOLN
25.0000 ug/h | INTRAVENOUS | Status: DC
  Administered 2013-02-05: 100 ug/h via INTRAVENOUS
  Filled 2013-02-05 (×2): qty 50

## 2013-02-05 MED ORDER — MIDAZOLAM HCL 5 MG/ML IJ SOLN
INTRAMUSCULAR | Status: AC
  Filled 2013-02-05: qty 2

## 2013-02-05 MED ORDER — PROPOFOL 10 MG/ML IV EMUL
5.0000 ug/kg/min | INTRAVENOUS | Status: DC
  Administered 2013-02-05: 15 ug/kg/min via INTRAVENOUS

## 2013-02-05 MED ORDER — HYDROMORPHONE HCL PF 1 MG/ML IJ SOLN
0.5000 mg | Freq: Once | INTRAMUSCULAR | Status: AC
  Administered 2013-02-05: 0.5 mg via INTRAVENOUS
  Filled 2013-02-05: qty 1

## 2013-02-05 MED ORDER — ALBUTEROL SULFATE 2 MG/5ML PO SYRP: 2.0000 mg | ORAL_SOLUTION | Freq: Once | ORAL | Status: DC

## 2013-02-05 MED ORDER — FUROSEMIDE 10 MG/ML IJ SOLN
INTRAMUSCULAR | Status: AC
  Filled 2013-02-05: qty 4

## 2013-02-05 MED ORDER — FUROSEMIDE 10 MG/ML IJ SOLN
40.0000 mg | Freq: Once | INTRAMUSCULAR | Status: AC
  Administered 2013-02-05: 40 mg via INTRAVENOUS

## 2013-02-05 MED ORDER — SODIUM CHLORIDE 0.9 % IV SOLN
80.0000 mg | Freq: Once | INTRAVENOUS | Status: DC
  Filled 2013-02-05: qty 80

## 2013-02-05 MED ORDER — FUROSEMIDE 10 MG/ML IJ SOLN
40.0000 mg | Freq: Three times a day (TID) | INTRAMUSCULAR | Status: DC
  Administered 2013-02-05 – 2013-02-06 (×3): 40 mg via INTRAVENOUS
  Filled 2013-02-05 (×6): qty 4

## 2013-02-05 MED ORDER — MEPERIDINE HCL 100 MG/ML IJ SOLN
INTRAMUSCULAR | Status: AC
  Filled 2013-02-05: qty 1

## 2013-02-05 MED ORDER — SODIUM CHLORIDE 0.9 % IV SOLN: INTRAVENOUS | Status: DC

## 2013-02-05 MED ORDER — SODIUM CHLORIDE 0.9 % IV SOLN
8.0000 mg/h | INTRAVENOUS | Status: DC
  Filled 2013-02-05 (×2): qty 80

## 2013-02-05 MED ORDER — MORPHINE SULFATE 4 MG/ML IJ SOLN
4.0000 mg | INTRAMUSCULAR | Status: DC | PRN
  Administered 2013-02-05: 2 mg via INTRAVENOUS
  Administered 2013-02-05: 4 mg via INTRAVENOUS
  Administered 2013-02-05: 2 mg via INTRAVENOUS
  Administered 2013-02-05: 4 mg via INTRAVENOUS
  Filled 2013-02-05 (×2): qty 1

## 2013-02-05 MED ORDER — FENTANYL CITRATE 0.05 MG/ML IJ SOLN: 50.0000 ug | INTRAMUSCULAR | Status: DC | PRN

## 2013-02-05 MED ORDER — SODIUM CHLORIDE 0.9 % IV BOLUS (SEPSIS): 250.0000 mL | Freq: Once | INTRAVENOUS | Status: DC

## 2013-02-05 MED ORDER — DULOXETINE HCL 60 MG PO CPEP
60.0000 mg | ORAL_CAPSULE | Freq: Every day | ORAL | Status: DC
  Filled 2013-02-05: qty 1

## 2013-02-05 MED ORDER — SUCRALFATE 1 GM/10ML PO SUSP
1.0000 g | Freq: Four times a day (QID) | ORAL | Status: DC
  Administered 2013-02-07 – 2013-02-09 (×7): 1 g via ORAL
  Filled 2013-02-05 (×19): qty 10

## 2013-02-05 MED ORDER — FUROSEMIDE 10 MG/ML IJ SOLN
40.0000 mg | INTRAMUSCULAR | Status: AC
  Administered 2013-02-05: 40 mg via INTRAVENOUS

## 2013-02-05 MED ORDER — FENTANYL CITRATE 0.05 MG/ML IJ SOLN
INTRAMUSCULAR | Status: AC
  Filled 2013-02-05: qty 2

## 2013-02-05 MED ORDER — SULFAMETHOXAZOLE-TMP DS 800-160 MG PO TABS
1.0000 | ORAL_TABLET | Freq: Two times a day (BID) | ORAL | Status: DC
  Filled 2013-02-05 (×2): qty 1

## 2013-02-05 MED ORDER — MIDAZOLAM HCL 2 MG/2ML IJ SOLN: 2.0000 mg | INTRAMUSCULAR | Status: AC

## 2013-02-05 MED ORDER — FENTANYL CITRATE 0.05 MG/ML IJ SOLN: 50.0000 ug | INTRAMUSCULAR | Status: DC

## 2013-02-05 MED ORDER — PROPOFOL 10 MG/ML IV EMUL
INTRAVENOUS | Status: AC
  Filled 2013-02-05: qty 100

## 2013-02-05 MED ORDER — ROCURONIUM BROMIDE 50 MG/5ML IV SOLN
70.0000 mg | INTRAVENOUS | Status: AC
  Administered 2013-02-05: 70 mg via INTRAVENOUS
  Filled 2013-02-05: qty 7

## 2013-02-05 MED ORDER — PANTOPRAZOLE SODIUM 40 MG IV SOLR: 40.0000 mg | Freq: Two times a day (BID) | INTRAVENOUS | Status: DC

## 2013-02-05 MED ORDER — MIDAZOLAM HCL 10 MG/2ML IJ SOLN
INTRAMUSCULAR | Status: DC | PRN
  Administered 2013-02-05: 2 mg via INTRAVENOUS

## 2013-02-05 MED ORDER — MORPHINE SULFATE 4 MG/ML IJ SOLN
INTRAMUSCULAR | Status: AC
  Filled 2013-02-05: qty 1

## 2013-02-05 MED ORDER — ALBUTEROL SULFATE (2.5 MG/3ML) 0.083% IN NEBU
INHALATION_SOLUTION | RESPIRATORY_TRACT | Status: AC
  Filled 2013-02-05: qty 3

## 2013-02-05 MED ORDER — FENTANYL CITRATE 0.05 MG/ML IJ SOLN
INTRAMUSCULAR | Status: AC
  Administered 2013-02-05: 16:00:00
  Filled 2013-02-05: qty 2

## 2013-02-05 MED ORDER — FENTANYL CITRATE 0.05 MG/ML IJ SOLN
INTRAMUSCULAR | Status: DC | PRN
  Administered 2013-02-05: 25 ug via INTRAVENOUS

## 2013-02-05 NOTE — Consult Note (Signed)
[facility] Consult: 8:19 AM 02/05/2013  LOS: 1 day    Referring Provider: Dr Craige Cotta   Primary Care Physician:  Londell Moh, MD Primary Gastroenterologist:  Dr. Greggory Stallion orr in 2009.  Pyrtle 2013    Reason for Consultation:  Gi bleed:  Melena, BRBPR and anemia.    HPI: [name] is a 73 y.o. female.  Hx CAD, CABG 1991, cardiac stent 1997,  s/p 2011 redo CABG/AVR. EF 35 to 40% from ischemic CM. Non STEMI MI in 03/2012 during which admission she developed Toursades requiring defibrillation/CPR and underwent DES stenting to left main. On Plavix, ASA and Coumadin. S/p 01/23/13 right TKR. Started on Bactrim 1/17 for right knee erythema.   Hemorrhagic gastritis on EGD, internal hemorrhoids on colonoscopy with benign random colon biopsies for complaint of diarrhea in 2009.  GI bleed in setting of therapeutic INR, requiring transfusions in 03/2011 at time of non STEMI.  EGD then with antral ulcers, gastritis, non-obstructing Schatzki's ring.  At time of admission and discharge earlier this month, she was not taking any PPI.   Now admitted with 24 hour hx of tarry stools progressing to BRBPR.  About 7 BMs over course 9 AM to late afternoon.   No abd pain.  + generalized weakness. INR was 2.0. Transfused 2 units PRBCs, 2 FFP.  Glucose in 400s at admission. I Despite transfusion her Hgb has fallen from 7.2 to 5.6. 2 more units of blood ordered  No Vitamin K to date.   Pt says daily brown BMs and eating well, no abdominal pain, no nausea, no anorexia. No dysphagia.   Past Medical History  Diagnosis Date  . High blood pressure   . Irregular heart rhythm   . High cholesterol   . Coronary artery disease   . COPD (chronic obstructive pulmonary disease)   . Anemia   . Irregular heart beat   . Myocardial  infarction march 2013  . Peripheral vascular disease   . Cardiac arrest   . CHF (congestive heart failure) 01/06/12    Transthoracic Echocardiogram EF 45-50%  . Memory deficits 09/06/2012  . Polyneuropathy in diabetes(357.2)   . Degenerative arthritis   . Fibromyalgia   . Diabetes mellitus     Type II  . Heart murmur   . Angina at rest     no new symptoms since 11/08/12 (01/17/13)  . DM type 2 (diabetes mellitus, type 2)     Past Surgical History  Procedure Laterality Date  . Gallbadder  2003  . Cardiac valve replacement  1991  . Heart bypass  1991/2005  . Angioplasty    . Coronary artery bypass graft    . Aortic valve replacement    . Cholecystectomy    . Esophagogastroduodenoscopy  03/31/2011    Procedure: ESOPHAGOGASTRODUODENOSCOPY (EGD);  Surgeon: Beverley Fiedler, MD;  Location[facility] ENDOSCOPY;  Service: Gastroenterology;  Laterality: N/A;  . Bypass graft  1991 and 2005  . Toenail avulsion Left   . Total knee arthroplasty Right 01/23/2013    Procedure: RIGHT TOTAL KNEE ARTHROPLASTY;  Surgeon: Valeria Batman, MD;  Location: [facility] OR;  Service: Orthopedics;  Laterality: Right;    Prior to Admission medications   Medication Sig Start Date End Date Taking? Authorizing Provider  atorvastatin (LIPITOR) 40 MG tablet Take 40 mg by mouth at bedtime. 9pm   Yes Historical Provider, MD  calcium carbonate (OS-CAL) 600 MG TABS Take 600 mg by mouth 2 (two) times daily.    Yes Historical Provider, MD  clopidogrel (PLAVIX) 75 MG tablet Take 1 tablet (75 mg total) by mouth daily with breakfast. 07/24/12  Yes Mihai Croitoru, MD  docusate sodium 100 MG CAPS Take 100 mg by mouth 2 (two) times daily. 01/25/13  Yes [name], PA-C  DULoxetine (CYMBALTA) 60 MG capsule Take 60 mg by mouth daily at 12 noon.    Yes Historical Provider, MD  folic acid (FOLVITE) 400 MCG tablet Take 400 mcg by mouth every evening. 5pm   Yes Historical Provider, MD  furosemide (LASIX) 40 MG tablet Take 1 tablet (40 mg total)  by mouth 2 (two) times daily. 06/14/12  Yes Wilburt Finlay, PA-C  gabapentin (NEURONTIN) 300 MG capsule Take 300 mg by mouth 3 (three) times daily.    Yes Historical Provider, MD  HYDROcodone-acetaminophen (NORCO) 10-325 MG per tablet Take 1 tablet by mouth every 6 (six) hours. 01/30/13  Yes Sharee Holster, NP  insulin glargine (LANTUS) 100 UNIT/ML injection Inject 60 Units into the skin daily with breakfast.   Yes Historical Provider, MD  insulin lispro (HUMALOG) 100 UNIT/ML injection Inject 7 Units into the skin 3 (three) times daily with meals.   Yes Historical Provider, MD  losartan (COZAAR) 100 MG tablet Take 100 mg by mouth daily.   Yes Historical Provider, MD  metoprolol succinate (TOPROL-XL) 25 MG 24 hr tablet Take 1 tablet (25 mg total) by mouth 2 (two) times daily. 07/24/12  Yes Mihai Croitoru, MD  Multiple Vitamin (MULTIVITAMIN WITH MINERALS) TABS tablet Take 1 tablet by mouth daily.   Yes Historical Provider, MD  nitroGLYCERIN (NITROSTAT) 0.4 MG SL tablet Place 0.4 mg under the tongue every 5 (five) minutes as needed for chest pain.   Yes Historical Provider, MD  polyethylene glycol powder (GLYCOLAX/MIRALAX) powder Take 17 g by mouth daily. 01/30/13  Yes Sharee Holster, NP  potassium chloride SA (K-DUR,KLOR-CON) 20 MEQ tablet Take 20 mEq by mouth 3 (three) times a week. Monday, Wednesday, Friday - at 9am   Yes Historical Provider, MD  saccharomyces boulardii (FLORASTOR) 250 MG capsule Take 250 mg by mouth 2 (two) times daily. Take for 2 weeks starting 02/02/13   Yes Historical Provider, MD  sulfamethoxazole-trimethoprim (BACTRIM DS) 800-160 MG per tablet Take 1 tablet by mouth every 12 (twelve) hours. 7 day course started 02/03/13 (for cellulitis)   Yes Historical Provider, MD  vitamin B-12 (CYANOCOBALAMIN) 1000 MCG tablet Take 1,000 mcg by mouth 2 (two) times daily.    Yes Historical Provider, MD  warfarin (COUMADIN) 2 MG tablet Take 2 mg by mouth at bedtime. 9pm   Yes Historical Provider, MD     Scheduled Meds: . DULoxetine  60 mg Oral Q1200  . insulin regular  0-10 Units Intravenous TID WC  . sodium chloride  250 mL Intravenous Once  . sulfamethoxazole-trimethoprim  1 tablet Oral Q12H   Infusions: . sodium chloride Stopped (02/05/13 0432)  . dextrose 5 % and 0.45% NaCl 150 mL/hr at 02/05/13 0700  . insulin (NOVOLIN-R) infusion 8.6 Units/hr (02/05/13 0620)  . pantoprozole (PROTONIX) infusion 8  mg/hr (02/05/13 0700)   PRN Meds: dextrose, morphine injection   Allergies as of 02/04/2013 - Review Complete 02/04/2013  Allergen Reaction Noted  . Shellfish allergy Anaphylaxis 03/30/2012  . Codeine Other (See Comments) 01/22/2011    Family History  Problem Relation Age of Onset  . Rheum arthritis Mother   . Cervical cancer Mother   . Stomach cancer Mother   . Cancer Mother   . Hyperlipidemia Mother   . Hypertension Mother   . Rheum arthritis Father   . Rheum arthritis Paternal Aunt   . Anesthesia problems Neg Hx   . Deep vein thrombosis Brother   . Hypertension Brother   . Hypertension Daughter     History   Social History  . Marital Status: Married    Spouse Name: N/A    Number of Children: 3  . Years of Education: N/A   Occupational History  . retired     bed control at [employer]   Social History Main Topics  . Smoking status: Former Smoker -- 0.50 packs/day for 15 years    Types: Cigarettes    Quit date: 01/18/1973  . Smokeless tobacco: Never Used  . Alcohol Use: No  . Drug Use: No  . Sexual Activity: Not on file   Other Topics Concern  . Not on file   Social History Narrative  . No narrative on file    REVIEW OF SYSTEMS: Constitutional:  Some unspecified weigh ENT:  No nose bleeds Pulm:  Resting dyspnea yesterday, has resolved CV:  No palpitations, no LE edema.  GU:  No hematuria, no frequency GI:  Per HPI Heme:  Per HPI   Transfusions:  Per HPI Neuro:  No headaches, no peripheral tingling or numbness Derm:  No itching,  no rash or sores.  Endocrine:  No sweats or chills.  No polyuria or dysuria Immunization:  Up to date flu shot Travel:  None beyond local counties in last few months.    PHYSICAL EXAM: Vital signs in last 24 hours: Filed Vitals:   02/05/13 0730  BP:   Pulse:   Temp: 97.8 F (36.6 C)  Resp:    Wt Readings from Last 3 Encounters:  02/05/13 83.8 kg (184 lb 11.9 oz)  01/30/13 83.008 kg (183 lb)  01/27/13 83.2 kg (183 lb 6.8 oz)   General: tired, somewhat ill looking WF.  She is comfortable and easily aroused Head:  No swelling or asymmetry  Eyes:  No icterus or pallor Ears:  Not obviously HOH  Nose:  No congestion or discharge Mouth:  Dentures in place.  Moist, clear, pink oral MM Neck:  No mass or JVD Lungs:  No labored breathing or cough.  No rales, no  Ronchi.  Lungs clear Heart: RRR with valve click Abdomen:  Soft, NT, ND, hypoactive BS.  No mass or HSM.   Rectal: deferred.  Pt in lounger chair   Musc/Skeltl: no erythema in bandaged, swollen right knee Extremities:  No CCE   Neurologic:  Oriented x 3.  Drowsy.  No tremor. No gross weakness Skin:  No telangectasia Tattoos:  none Nodes:  No cervical adenopathy   Psych:  Affect blunted.  No agitation or anxiety.    Intake/Output from previous day: 01/18 0701 - 01/19 0700 In: 4800 [I.V.:3200; Blood:715] Out: -  Intake/Output this shift:    LAB RESULTS:  Recent Labs  02/04/13 1955 02/04/13 2011 02/05/13 0535  WBC 4.1  --  4.1  HGB 7.2* 6.5* 5.6*  HCT 21.2* 19.0* 16.5*  PLT 421*  --  203   BMET Lab Results  Component Value Date   NA 142 02/05/2013   NA 136* 02/05/2013   NA 127* 02/04/2013   K 4.1 02/05/2013   K 4.9 02/05/2013   K 4.7 02/04/2013   CL 105 02/05/2013   CL 103 02/05/2013   CL 94* 02/04/2013   CO2 21 02/05/2013   CO2 22 02/05/2013   CO2 19 02/04/2013   GLUCOSE 216* 02/05/2013   GLUCOSE 290* 02/05/2013   GLUCOSE 475* 02/04/2013   BUN 56* 02/05/2013   BUN 68* 02/05/2013   BUN 84* 02/04/2013    CREATININE 1.08 02/05/2013   CREATININE 1.22* 02/05/2013   CREATININE 1.50* 02/04/2013   CALCIUM 7.5* 02/05/2013   CALCIUM 8.0* 02/05/2013   CALCIUM 8.9 02/04/2013   LFT No results found for this basename: PROT, ALBUMIN, AST, ALT, ALKPHOS, BILITOT, BILIDIR, IBILI,  in the last 72 hours PT/INR Lab Results  Component Value Date   INR 2.05* 02/04/2013   INR 2.52* 01/27/2013   INR 2.21* 01/26/2013   Hepatitis Panel No results found for this basename: HEPBSAG, HCVAB, HEPAIGM, HEPBIGM,  in the last 72 hours C-Diff No components found with this basename: cdiff   Lipase  No results found for this basename: lipase    Drugs of Abuse  No results found for this basename: labopia, cocainscrnur, labbenz, amphetmu, thcu, labbarb     RADIOLOGY STUDIES: No results found.  ENDOSCOPIC STUDIES: EGD 03/2011  Dr Rhea Belton For melena ENDOSCOPIC IMPRESSION:  1) Partial and non-obstructing Schatzki's ring at the  gastroesophageal junction  2) Otherwise normal esophagus  3) Two cleaned-based ulcers in the antrum. Multiple biopsies  performed.  4) Mild gastritis in the antrum.  5) Normal duodenum  2009 Colonoscopy and EGD by Dr Virginia Rochester, per HPI  IMPRESSION:   *  Acute upper GI bleed.  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  No bleeding overnight or today.   *  ABL anemia.  3rd of 4 units transfusing.  *  Chronic Counmadin and Plavix, ASA.    *  S/p right TKR.  Doing well with walker assisted ambulation at SNF.  On Bactrim for some erythema at wound site.   *  CAD with multiple surgeries and interventions on mutiple blood "thinnners"  *  S/p AVR.     PLAN:     *  Start BID IV Protonix.  coags this AM.  Keep NPO except sips with meds.  *  Has 2:15 slot for EGD today if INR is closer to 1.5.  Also Dr Juanda Chance may delay due to pt's chronic Plavix.    Jennye Moccasin  02/05/2013, 8:19 AM  Attending MD note:   I have taken a  history, examined the patient, and reviewed the chart. I agree with the Advanced Practitioner's impression and recommendations. Repeat INR pending, [pt has received 2 u of FFP since last INR 2.0. She continues to drop her H/H. Will proceed with EGD/ablation of a bleeding site.  [facility] Pager # [phone]  Pager: [phone]

## 2013-02-05 NOTE — Progress Notes (Signed)
UR Completed.  [name] [phone] 02/05/2013  

## 2013-02-05 NOTE — Procedures (Signed)
STAFF MD Note  Personally supervised entire procedure at bedside. EAsy intubation. No evidence of aspiration. Glidescope used   Dr. Kalman Shan, M.D., [facility].C.P Pulmonary and Critical Care Medicine Staff Physician [facility] Pager: [phone], If no answer or between  15:00h - 7:00h: call [phone]  02/05/2013 4:06 PM

## 2013-02-05 NOTE — Progress Notes (Signed)
CCM  MD ordered Lasix 40mg  x 1, rounding MD (Ramaswamy) at bedside, ordered additional Lasix 40mg  IV, stat CXR, and STAT BIPAP, while initiating BIPAP, patient became increasing restless, Per MD, patient was intubated at bedside.

## 2013-02-05 NOTE — Procedures (Signed)
Central Venous Catheter Insertion Procedure Note [name] [id number] 08-10-1940  Procedure: Insertion of Central Venous Catheter Indications: Assessment of intravascular volume, Drug and/or fluid administration and Frequent blood sampling  Procedure Details Consent: Risks of procedure as well as the alternatives and risks of each were explained to the (patient/caregiver).  Consent for procedure obtained.  Time Out: Verified patient identification, verified procedure, site/side was marked, verified correct patient position, special equipment/implants available, medications/allergies/relevent history reviewed, required imaging and test results available.  Performed  Maximum sterile technique was used including antiseptics, cap, gloves, gown, hand hygiene, mask and sheet. Skin prep: Chlorhexidine; local anesthetic administered  A antimicrobial bonded/coated triple lumen catheter was placed in the right internal jugular vein to 16 cm using the Seldinger technique.  Evaluation Blood flow good Complications: No apparent complications Patient did tolerate procedure well. Chest X-ray ordered to verify placement.  CXR: pending.   Procedure performed with ultrasound guidance for real time vessel cannulation.     Canary Brim, NP-C [facility] Pgr: [phone] or [phone]    02/05/2013, 9:44 PM

## 2013-02-05 NOTE — Care Management Note (Signed)
    Page 1 of 2   02/08/2013     3:48:52 PM   CARE MANAGEMENT NOTE 02/08/2013  Patient:  [name], [name]   Account Number:  [id number]  Date Initiated:  02/05/2013  Documentation initiated by:  Avie Arenas  Subjective/Objective Assessment:   Readmitted with GIB - DKA - on insulin drip.     Action/Plan:   Anticipated DC Date:  02-11-13   Anticipated DC Plan:  LONG TERM ACUTE CARE (LTAC)  In-house referral  Clinical Social Worker      DC Planning Services  CM consult      Choice offered to / List presented to:             Status of service:  In process, will continue to follow Medicare Important Message given?   (If response is "NO", the following Medicare IM given date fields will be blank) Date Medicare IM given:   Date Additional Medicare IM given:    Discharge Disposition:    Per UR Regulation:  Reviewed for med. necessity/level of care/duration of stay  If discussed at Long Length of Stay Meetings, dates discussed:    Comments:  Contact:  White,Lisa Daughter [phone]   Retta Mac Daughter     [phone]   [facility] Daughter [phone]  02-08-13 3:45pm Avie Arenas, RNBSN [phone] Talked with Dr. Craige Cotta - agreeable with Ltach level for possibility as early as 02-08-13.  Talked with daugther, [name], who is going to talk to sister, Clydie Braun.  Like Secondary school teacher.  May come over and tour today.  have my cell number to call me back.  02-07-13 2:30pm Avie Arenas, RNBSN - [phone] Talked with daughter, Verlon Au and husband in room.  Patient asleep then wakes up screaming but goes back to sleep with reassurance from the daughter.  Prior to her knee surgery was independent - living at home with her husband.  Had been working prior to her knees getting so bad.  Do not want to go back to [facility].  Daughter [name] is working with orthopedic on possible CIR admission - I discussed Ltach option.  Will need PT/OT orders when able.  CM will continue to follow  for best option for patient.   02-05-13 9:15am Avie Arenas, RNBSN [phone] From SNF - referral for SW placed.  Readmission - ?? ltach candidate.

## 2013-02-05 NOTE — Progress Notes (Signed)
Brief X-Cover Note  MD Tresa Endo notified of troponin > 20 in setting of UGIB with with EGD this afternoon revealing ulcer (no intervention) and 4 units of prbc and 2 FFP leading to what sounds like flash pulmonary edema requiring intubation. Vitals reviews; BP 125/57  Pulse 101  Temp(Src) 97.1 F (36.2 C) (Axillary)  Resp 24  Ht 5\' 3"  (1.6 m)  Wt 83.8 kg (184 lb 11.9 oz)  BMI 32.73 kg/m2  SpO2 100%She's comfortable and moderately sedated on exam, mildly tachycardic, bp 90s/70s before levophed started but transient BP. Right IJ recently placed. +HJR with JVP midneck at 30 degrees. Lungs with scattered rales but good aeration superiorly. Bilateral edema. Pulses present, extremities warm. She received 120 mg IV lasix and has had 1.4L UOP with significant improvement in symptoms. She's off heparin/warfarin given significant GI bleed despite mechanical AVR '91. ECG with St depressions but I think given significant CAD burden that this is just as likely related to demand given profound hypovolemia/GI bleed and significant stress leading to elevated Troponin. No ST elevation and very poor cath lab/intervention candidate given continued bleeding as previously discussed by the day cardiology team. No plavix currently either with 10 months of DAPT for protected left main stent. Agree with current supportive plan hemodynamicly, gentle fluids at most, continue to home heparin/plavix with plans to restart heparin gradually once GI bleed resolves for the mechanical AVR. Will continue to watch closely. Please update cardiology for change in symptoms or development of ischemic symptoms.   11-06-2005, MD

## 2013-02-05 NOTE — Consult Note (Addendum)
Referring Physician: P/CCM  Primary Cardiologist: Croituro Reason for Consultation: GIB in setting of mechanical AVR  HPI:  [name] is a 73 y/o woman with an extensive cardiac history including CAD s/p CABG/SJ mechanical AVR in 1991 with re-do CABG 2006. Systolic HF with EF ~40%. Admitted in 3/14 with an acute coronary syndrome which was complicated by a cardiac arrest (ischemic torsades de pointes) and received a drug-eluting stent to a protected leftmain coronary artery. She is maintained on plavix, coumadin (no ASA). She had previous UGIB in 2013 with EGD showing EGD then with antral ulcers, gastritis, non-obstructing Schatzki's ring.  Admitted with 02/04/13 with recurrent UGIB with Hgb 9.7-> 7.2. INR 2.05.  Received 2units RBCs and 2 FFP and HGb down to 5.6 today. Now getting 2 more units RBCs. EGD scheduled for this afternoon. Cardiolgoy consult called with regard to holding coumadin and Plavix.  Of note, Plavix held for R TKA on 01/23/13 without complication. Restarted post-op.     Review of Systems:     Cardiac Review of Systems: {Y] = yes [ ]  = no  Chest Pain [    ]  Resting SOB [   ] Exertional SOB  [  ]  Orthopnea [  ]   Pedal Edema [   ]    Palpitations [  ] Syncope  [  ]   Presyncope [   ]  General Review of Systems: [Y] = yes [  ]=no Constitional: recent weight change [  ]; anorexia [  ]; fatigue Cove.Etienne  ]; nausea [  ]; night sweats [  ]; fever [  ]; or chills [  ];                                                                                                                                           Eye : blurred vision [  ]; diplopia [   ]; vision changes [  ];  Amaurosis fugax[  ]; Resp: cough [  ];  wheezing[  ];  hemoptysis[  ]; shortness of breath[  ]; paroxysmal nocturnal dyspnea[  ]; dyspnea on exertion[  ]; or orthopnea[  ];  GI:  gallstones[  ], vomiting[  ];  dysphagia[  ]; melena[y  ];  hematochezia [  ]; heartburn[  ];    GU: kidney stones [  ]; hematuria[  ];    dysuria [  ];  nocturia[  ];  history of     obstruction [  ];                 Skin: rash, swelling[  ];, hair loss[  ];  peripheral edema[  ];  or itching[  ]; Musculosketetal: myalgias[  ];  joint swelling[ y ];  joint erythema[  ];  joint pain[y  ];  back pain[  ];  Heme/Lymph: bruising[  ];  bleeding[y  ];  anemia[  ];  Neuro: TIA[  ];  headaches[  ];  stroke[  ];  vertigo[  ];  seizures[  ];   paresthesias[  ];  difficulty walking[  ];  Psych:depression[  ]; anxiety[  ];  Endocrine: diabetes[y  ];  thyroid dysfunction[  ];  Other:  Past Medical History  Diagnosis Date  . High blood pressure   . Irregular heart rhythm   . High cholesterol   . Coronary artery disease   . COPD (chronic obstructive pulmonary disease)   . Anemia   . Irregular heart beat   . Myocardial infarction march 2013  . Peripheral vascular disease   . Cardiac arrest   . CHF (congestive heart failure) 01/06/12    Transthoracic Echocardiogram EF 45-50%  . Memory deficits 09/06/2012  . Polyneuropathy in diabetes(357.2)   . Degenerative arthritis   . Fibromyalgia   . Diabetes mellitus     Type II  . Heart murmur   . Angina at rest     no new symptoms since 11/08/12 (01/17/13)  . DM type 2 (diabetes mellitus, type 2)     Medications Prior to Admission  Medication Sig Dispense Refill  . atorvastatin (LIPITOR) 40 MG tablet Take 40 mg by mouth at bedtime. 9pm      . calcium carbonate (OS-CAL) 600 MG TABS Take 600 mg by mouth 2 (two) times daily.       . clopidogrel (PLAVIX) 75 MG tablet Take 1 tablet (75 mg total) by mouth daily with breakfast.  90 tablet  3  . docusate sodium 100 MG CAPS Take 100 mg by mouth 2 (two) times daily.  10 capsule  0  . DULoxetine (CYMBALTA) 60 MG capsule Take 60 mg by mouth daily at 12 noon.       . folic acid (FOLVITE) 400 MCG tablet Take 400 mcg by mouth every evening. 5pm      . furosemide (LASIX) 40 MG tablet Take 1 tablet (40 mg total) by mouth 2 (two) times daily.  60 tablet   3  . gabapentin (NEURONTIN) 300 MG capsule Take 300 mg by mouth 3 (three) times daily.       Marland Kitchen. HYDROcodone-acetaminophen (NORCO) 10-325 MG per tablet Take 1 tablet by mouth every 6 (six) hours.  30 tablet  0  . insulin glargine (LANTUS) 100 UNIT/ML injection Inject 60 Units into the skin daily with breakfast.      . insulin lispro (HUMALOG) 100 UNIT/ML injection Inject 7 Units into the skin 3 (three) times daily with meals.      Marland Kitchen. losartan (COZAAR) 100 MG tablet Take 100 mg by mouth daily.      . metoprolol succinate (TOPROL-XL) 25 MG 24 hr tablet Take 1 tablet (25 mg total) by mouth 2 (two) times daily.  180 tablet  3  . Multiple Vitamin (MULTIVITAMIN WITH MINERALS) TABS tablet Take 1 tablet by mouth daily.      . nitroGLYCERIN (NITROSTAT) 0.4 MG SL tablet Place 0.4 mg under the tongue every 5 (five) minutes as needed for chest pain.      . polyethylene glycol powder (GLYCOLAX/MIRALAX) powder Take 17 g by mouth daily.  3350 g  1  . potassium chloride SA (K-DUR,KLOR-CON) 20 MEQ tablet Take 20 mEq by mouth 3 (three) times a week. Monday, Wednesday, Friday - at 9am      . saccharomyces boulardii (FLORASTOR) 250 MG capsule Take 250 mg by mouth 2 (two) times  daily. Take for 2 weeks starting 02/02/13      . sulfamethoxazole-trimethoprim (BACTRIM DS) 800-160 MG per tablet Take 1 tablet by mouth every 12 (twelve) hours. 7 day course started 02/03/13 (for cellulitis)      . vitamin B-12 (CYANOCOBALAMIN) 1000 MCG tablet Take 1,000 mcg by mouth 2 (two) times daily.       Marland Kitchen warfarin (COUMADIN) 2 MG tablet Take 2 mg by mouth at bedtime. 9pm         . insulin regular  0-10 Units Intravenous TID WC  . [START ON 02/08/2013] pantoprazole (PROTONIX) IV  40 mg Intravenous Q12H  . sodium chloride  250 mL Intravenous Once  . sulfamethoxazole-trimethoprim  1 tablet Oral Q12H    Infusions: . dextrose 5 % and 0.45% NaCl 150 mL/hr at 02/05/13 0700  . insulin (NOVOLIN-R) infusion 8.6 Units/hr (02/05/13 2836)     Allergies  Allergen Reactions  . Shellfish Allergy Anaphylaxis    Not allergic to betadine  . Codeine Other (See Comments)    Feels sick    History   Social History  . Marital Status: Married    Spouse Name: N/A    Number of Children: 3  . Years of Education: N/A   Occupational History  . retired     bed control at [employer]   Social History Main Topics  . Smoking status: Former Smoker -- 0.50 packs/day for 15 years    Types: Cigarettes    Quit date: 01/18/1973  . Smokeless tobacco: Never Used  . Alcohol Use: No  . Drug Use: No  . Sexual Activity: Not on file   Other Topics Concern  . Not on file   Social History Narrative  . No narrative on file    Family History  Problem Relation Age of Onset  . Rheum arthritis Mother   . Cervical cancer Mother   . Stomach cancer Mother   . Cancer Mother   . Hyperlipidemia Mother   . Hypertension Mother   . Rheum arthritis Father   . Rheum arthritis Paternal Aunt   . Anesthesia problems Neg Hx   . Deep vein thrombosis Brother   . Hypertension Brother   . Hypertension Daughter     PHYSICAL EXAM: Filed Vitals:   02/05/13 1100  BP: 134/49  Pulse: 103  Temp: 98.1 F (36.7 C)  Resp: 19     Intake/Output Summary (Last 24 hours) at 02/05/13 1135 Last data filed at 02/05/13 1030  Gross per 24 hour  Intake 4812.5 ml  Output      0 ml  Net 4812.5 ml    General:  Elderly. Frail. Sitting in chair HEENT: normal Neck: supple. JVP 8 Carotids 2+ bilat; no bruits. No lymphadenopathy or thryomegaly appreciated. Cor: PMI nondisplaced. Regular rate & rhythm.  Mechanical S2 (crisp). No murmur Lungs: clear Abdomen: soft, nontender, nondistended. No hepatosplenomegaly. No bruits or masses. Good bowel sounds. Extremities: no cyanosis, clubbing, rash, edema  S/p Recent R TKA Neuro: alert & oriented x 3, cranial nerves grossly intact. moves all 4 extremities w/o difficulty. Affect pleasant.  ECG: NSR. LVH with  diffuse ST abnormalities. No change from previous  Results for orders placed during the hospital encounter of 02/04/13 (from the past 24 hour(s))  GLUCOSE, CAPILLARY     Status: Abnormal   Collection Time    02/04/13  7:33 PM      Result Value Range   Glucose-Capillary 424 (*) 70 - 99 mg/dL  CBC  WITH DIFFERENTIAL     Status: Abnormal   Collection Time    02/04/13  7:55 PM      Result Value Range   WBC 4.1  4.0 - 10.5 K/uL   RBC 2.27 (*) 3.87 - 5.11 MIL/uL   Hemoglobin 7.2 (*) 12.0 - 15.0 g/dL   HCT 96.2 (*) 95.2 - 84.1 %   MCV 93.4  78.0 - 100.0 fL   MCH 31.7  26.0 - 34.0 pg   MCHC 34.0  30.0 - 36.0 g/dL   RDW 32.4  40.1 - 02.7 %   Platelets 421 (*) 150 - 400 K/uL   Neutrophils Relative % 83 (*) 43 - 77 %   Neutro Abs 3.4  1.7 - 7.7 K/uL   Lymphocytes Relative 11 (*) 12 - 46 %   Lymphs Abs 0.5 (*) 0.7 - 4.0 K/uL   Monocytes Relative 6  3 - 12 %   Monocytes Absolute 0.2  0.1 - 1.0 K/uL   Eosinophils Relative 0  0 - 5 %   Eosinophils Absolute 0.0  0.0 - 0.7 K/uL   Basophils Relative 0  0 - 1 %   Basophils Absolute 0.0  0.0 - 0.1 K/uL  BASIC METABOLIC PANEL     Status: Abnormal   Collection Time    02/04/13  7:55 PM      Result Value Range   Sodium 128 (*) 137 - 147 mEq/L   Potassium 5.0  3.7 - 5.3 mEq/L   Chloride 90 (*) 96 - 112 mEq/L   CO2 19  19 - 32 mEq/L   Glucose, Bld 477 (*) 70 - 99 mg/dL   BUN 79 (*) 6 - 23 mg/dL   Creatinine, Ser 2.53 (*) 0.50 - 1.10 mg/dL   Calcium 8.9  8.4 - 66.4 mg/dL   GFR calc non Af Amer 38 (*) >90 mL/min   GFR calc Af Amer 44 (*) >90 mL/min  PROTIME-INR     Status: Abnormal   Collection Time    02/04/13  7:55 PM      Result Value Range   Prothrombin Time 22.5 (*) 11.6 - 15.2 seconds   INR 2.05 (*) 0.00 - 1.49  TYPE AND SCREEN     Status: None   Collection Time    02/04/13  7:55 PM      Result Value Range   ABO/RH(D) A POS     Antibody Screen NEG     Sample Expiration 02/07/2013     Unit Number [id number]     Blood Component  Type RED CELLS,LR     Unit division 00     Status of Unit ISSUED,FINAL     Transfusion Status OK TO TRANSFUSE     Crossmatch Result Compatible     Unit Number [id number]     Blood Component Type RED CELLS,LR     Unit division 00     Status of Unit ISSUED,FINAL     Transfusion Status OK TO TRANSFUSE     Crossmatch Result Compatible     Unit Number [id number]     Blood Component Type RED CELLS,LR     Unit division 00     Status of Unit ISSUED     Transfusion Status OK TO TRANSFUSE     Crossmatch Result Compatible     Unit Number [id number]     Blood Component Type RED CELLS,LR     Unit division 00     Status of Unit ISSUED  Transfusion Status OK TO TRANSFUSE     Crossmatch Result Compatible    PREPARE RBC (CROSSMATCH)     Status: None   Collection Time    02/04/13  7:55 PM      Result Value Range   Order Confirmation ORDER PROCESSED BY BLOOD BANK    POCT I-STAT, CHEM 8     Status: Abnormal   Collection Time    02/04/13  8:11 PM      Result Value Range   Sodium 127 (*) 137 - 147 mEq/L   Potassium 4.7  3.7 - 5.3 mEq/L   Chloride 94 (*) 96 - 112 mEq/L   BUN 84 (*) 6 - 23 mg/dL   Creatinine, Ser 1.61 (*) 0.50 - 1.10 mg/dL   Glucose, Bld 096 (*) 70 - 99 mg/dL   Calcium, Ion 0.45  4.09 - 1.30 mmol/L   TCO2 21  0 - 100 mmol/L   Hemoglobin 6.5 (*) 12.0 - 15.0 g/dL   HCT 81.1 (*) 91.4 - 78.2 %   Comment NOTIFIED PHYSICIAN    OCCULT BLOOD, POC DEVICE     Status: Abnormal   Collection Time    02/04/13  8:26 PM      Result Value Range   Fecal Occult Bld POSITIVE (*) NEGATIVE  POCT I-STAT 3, BLOOD GAS (G3P V)     Status: Abnormal   Collection Time    02/04/13  8:44 PM      Result Value Range   pH, Ven 7.431 (*) 7.250 - 7.300   pCO2, Ven 32.6 (*) 45.0 - 50.0 mmHg   pO2, Ven 16.0 (*) 30.0 - 45.0 mmHg   Bicarbonate 21.8  20.0 - 24.0 mEq/L   TCO2 23  0 - 100 mmol/L   O2 Saturation 25.0     Acid-base deficit 2.0  0.0 - 2.0 mmol/L   Patient temperature 97.7 F      Sample type VENOUS     Comment NOTIFIED PHYSICIAN    CG4 I-STAT (LACTIC ACID)     Status: Abnormal   Collection Time    02/04/13  8:44 PM      Result Value Range   Lactic Acid, Venous 3.50 (*) 0.5 - 2.2 mmol/L  GLUCOSE, CAPILLARY     Status: Abnormal   Collection Time    02/04/13 11:05 PM      Result Value Range   Glucose-Capillary 393 (*) 70 - 99 mg/dL  PREPARE FRESH FROZEN PLASMA     Status: None   Collection Time    02/04/13 11:30 PM      Result Value Range   Unit Number [id number]     Blood Component Type THW PLS APHR     Unit division 00     Status of Unit ISSUED     Transfusion Status OK TO TRANSFUSE     Unit Number [id number]     Blood Component Type THAWED PLASMA     Unit division 00     Status of Unit ISSUED     Transfusion Status OK TO TRANSFUSE    GLUCOSE, CAPILLARY     Status: Abnormal   Collection Time    02/05/13 12:59 AM      Result Value Range   Glucose-Capillary 348 (*) 70 - 99 mg/dL  BASIC METABOLIC PANEL     Status: Abnormal   Collection Time    02/05/13  3:00 AM      Result Value Range   Sodium 136 (*) 137 -  147 mEq/L   Potassium 4.9  3.7 - 5.3 mEq/L   Chloride 103  96 - 112 mEq/L   CO2 22  19 - 32 mEq/L   Glucose, Bld 290 (*) 70 - 99 mg/dL   BUN 68 (*) 6 - 23 mg/dL   Creatinine, Ser 0.27 (*) 0.50 - 1.10 mg/dL   Calcium 8.0 (*) 8.4 - 10.5 mg/dL   GFR calc non Af Amer 43 (*) >90 mL/min   GFR calc Af Amer 50 (*) >90 mL/min  BASIC METABOLIC PANEL     Status: Abnormal   Collection Time    02/05/13  5:28 AM      Result Value Range   Sodium 142  137 - 147 mEq/L   Potassium 4.1  3.7 - 5.3 mEq/L   Chloride 105  96 - 112 mEq/L   CO2 21  19 - 32 mEq/L   Glucose, Bld 216 (*) 70 - 99 mg/dL   BUN 56 (*) 6 - 23 mg/dL   Creatinine, Ser 2.53  0.50 - 1.10 mg/dL   Calcium 7.5 (*) 8.4 - 10.5 mg/dL   GFR calc non Af Amer 50 (*) >90 mL/min   GFR calc Af Amer 58 (*) >90 mL/min  CBC     Status: Abnormal   Collection Time    02/05/13  5:35 AM       Result Value Range   WBC 4.1  4.0 - 10.5 K/uL   RBC 1.84 (*) 3.87 - 5.11 MIL/uL   Hemoglobin 5.6 (*) 12.0 - 15.0 g/dL   HCT 66.4 (*) 40.3 - 47.4 %   MCV 89.7  78.0 - 100.0 fL   MCH 30.4  26.0 - 34.0 pg   MCHC 33.9  30.0 - 36.0 g/dL   RDW 25.9 (*) 56.3 - 87.5 %   Platelets 203  150 - 400 K/uL  LACTIC ACID, PLASMA     Status: None   Collection Time    02/05/13  5:35 AM      Result Value Range   Lactic Acid, Venous 1.8  0.5 - 2.2 mmol/L  PREPARE RBC (CROSSMATCH)     Status: None   Collection Time    02/05/13  6:06 AM      Result Value Range   Order Confirmation ORDER PROCESSED BY BLOOD BANK     No results found.   ASSESSMENT: 1) Acute blood loss anemia due to recurrent UGIB 2) s/p mechanical AV replacement 1991 3) CAD s/p redo CABG 1991, 2006      --DES to protected LM 3/14 4) Chronic systolic HF EF ~40% 5) DM2  PLAN/DISCUSSION:  She has an active GIB with an H/H that continues to drop. At this point, we have no other option but to hold her coumadin and Plavix. Fortunately the risk of acute valve thrombosis with AVR is fairly low and I think we can safely hold coumadin anti-coagulation for at least several days as needed. With regard to Plavix, she is now >10 months out from her protected LM stent and depending what is found on EGD the risk of resuming Plavix in setting of need for warfarin anti-coagulation may be prohibitive. We will follow. Agree with IV PPI and SCDs. Fortunately no signs of myocardial ischemia. Watch volume status closely. Can give lasix as needed.   Trypp Heckmann,MD 11:35 AM

## 2013-02-05 NOTE — Progress Notes (Signed)
eLink Physician-Brief Progress Note Patient Name: [name] DOB: 25-Jun-1940 MRN: [id number]  Date of Service  02/05/2013   HPI/Events of Note   Hypotension.  eICU Interventions  Levophed ordered.      YACOUB,WESAM 02/05/2013, 7:49 PM

## 2013-02-05 NOTE — Progress Notes (Signed)
Patient ID: [name], female   DOB: [birth year], 73 y.o.   MRN: [id number] [facility]   Ms Hayse  will be two weeks tomorrow post right THR. Discharged last Sat to [facility] and was progressing nicely over the past week with PT. Daughter, Arline Asp me this am to let me know [name] was readmitted to ICU with GI bleed. Has been on coumadin both pre and post TKR.Had endoscopy with evidence of gastric bleed without active hemorrhage. Has been transfused 4 units of blood and 2 units of FFP. H/H 11/33. Presently intubated. Right knee wound looks great-no drainage and minimal swelling-will have nursing staff remove staples and apply steri strips over benzoin. Continue rehab/PT when stable-might be candidate for [facility] IP rehab

## 2013-02-05 NOTE — Progress Notes (Signed)
Paged CARDs about patient's troponin, will obtain STAT EKG, Per CARDS MD

## 2013-02-05 NOTE — Procedures (Signed)
Intubation Procedure Note [name] [id number] 1940/12/18  Procedure: Intubation Indications: Airway protection and maintenance  Procedure Details Consent: Risks of procedure as well as the alternatives and risks of each were explained to the (patient/caregiver).  Consent for procedure obtained. Time Out: Verified patient identification, verified procedure, site/side was marked, verified correct patient position, special equipment/implants available, medications/allergies/relevent history reviewed, required imaging and test results available.  Performed  Maximum sterile technique was used including hand hygiene and mask .  MAC - Standard Glidescope    Evaluation Hemodynamic Status: BP stable throughout; O2 sats: stable throughout Patient's Current Condition: stable Complications: No apparent complications Patient did tolerate procedure well. Chest X-ray ordered to verify placement.  CXR: pending.   Joneen Roach, ACNP [facility] Pulmonology/Critical Care Pager [phone] or [phone]

## 2013-02-05 NOTE — Progress Notes (Signed)
ANTIBIOTIC CONSULT NOTE - INITIAL  Pharmacy Consult for Septra Indication: rule out pneumonia  Allergies  Allergen Reactions  . Shellfish Allergy Anaphylaxis    Not allergic to betadine  . Codeine Other (See Comments)    Feels sick    Patient Measurements: Height: 5\' 3"  (160 cm) Weight: 184 lb 11.9 oz (83.8 kg) IBW/kg (Calculated) : 52.4 Adjusted Body Weight: 62kg  Vital Signs: Temp: 97.1 F (36.2 C) (01/19 2000) Temp src: Axillary (01/19 2000) BP: 125/57 mmHg (01/19 2200) Pulse Rate: 101 (01/19 2200) Intake/Output from previous day: 01/18 0701 - 01/19 0700 In: 4650 [I.V.:3200; Blood:715] Out: 150 [Urine:150] Intake/Output from this shift: Total I/O In: 81.6 [I.V.:81.6] Out: 235 [Urine:235]  Labs:  Recent Labs  02/04/13 1955 02/04/13 2011 02/05/13 0300 02/05/13 0528 02/05/13 0535 02/05/13 1625  WBC 4.1  --   --   --  4.1 30.0*  HGB 7.2* 6.5*  --   --  5.6* 11.7*  PLT 421*  --   --   --  203 369  CREATININE 1.36* 1.50* 1.22* 1.08  --  1.09   Estimated Creatinine Clearance: 47.9 ml/min (by C-G formula based on Cr of 1.09).   Microbiology: Recent Results (from the past 720 hour(s))  URINE CULTURE     Status: None   Collection Time    01/16/13 11:46 AM      Result Value Range Status   Specimen Description URINE, CLEAN CATCH   Final   Special Requests NONE   Final   Culture  Setup Time     Final   Value: 01/16/2013 17:56     Performed at 01/18/2013 Count     Final   Value: NO GROWTH     Performed at [facility]   Culture     Final   Value: NO GROWTH     Performed at [facility]   Report Status 01/17/2013 FINAL   Final  SURGICAL PCR SCREEN     Status: None   Collection Time    01/16/13 11:46 AM      Result Value Range Status   MRSA, PCR NEGATIVE  NEGATIVE Final   Staphylococcus aureus NEGATIVE  NEGATIVE Final   Comment:            The Xpert SA Assay (FDA     approved for NASAL specimens     in patients over  21 years of age),     is one component of     a comprehensive surveillance     program.  Test performance has     been validated by 36 for patients greater     than or equal to 58 year old.     It is not intended     to diagnose infection nor to     guide or monitor treatment.  URINE CULTURE     Status: None   Collection Time    01/26/13  8:44 AM      Result Value Range Status   Specimen Description URINE, CATHETERIZED   Final   Special Requests NONE   Final   Culture  Setup Time     Final   Value: 01/26/2013 09:13     Performed at 03/26/2013   [place] Count     Final   Value: NO GROWTH     Performed at [facility]   Culture     Final  Value: NO GROWTH     Performed at [facility]   Report Status 01/27/2013 FINAL   Final    Medical History: Past Medical History  Diagnosis Date  . High blood pressure   . Irregular heart rhythm   . High cholesterol   . Coronary artery disease   . COPD (chronic obstructive pulmonary disease)   . Anemia   . Irregular heart beat   . Myocardial infarction march 2013  . Peripheral vascular disease   . Cardiac arrest   . CHF (congestive heart failure) 01/06/12    Transthoracic Echocardiogram EF 45-50%  . Memory deficits 09/06/2012  . Polyneuropathy in diabetes(357.2)   . Degenerative arthritis   . Fibromyalgia   . Diabetes mellitus     Type II  . Heart murmur   . Angina at rest     no new symptoms since 11/08/12 (01/17/13)  . DM type 2 (diabetes mellitus, type 2)     Medications:  Prescriptions prior to admission  Medication Sig Dispense Refill  . atorvastatin (LIPITOR) 40 MG tablet Take 40 mg by mouth at bedtime. 9pm      . calcium carbonate (OS-CAL) 600 MG TABS Take 600 mg by mouth 2 (two) times daily.       . clopidogrel (PLAVIX) 75 MG tablet Take 1 tablet (75 mg total) by mouth daily with breakfast.  90 tablet  3  . docusate sodium 100 MG CAPS Take 100 mg by mouth 2 (two) times daily.   10 capsule  0  . DULoxetine (CYMBALTA) 60 MG capsule Take 60 mg by mouth daily at 12 noon.       . folic acid (FOLVITE) 400 MCG tablet Take 400 mcg by mouth every evening. 5pm      . furosemide (LASIX) 40 MG tablet Take 1 tablet (40 mg total) by mouth 2 (two) times daily.  60 tablet  3  . gabapentin (NEURONTIN) 300 MG capsule Take 300 mg by mouth 3 (three) times daily.       Marland Kitchen HYDROcodone-acetaminophen (NORCO) 10-325 MG per tablet Take 1 tablet by mouth every 6 (six) hours.  30 tablet  0  . insulin glargine (LANTUS) 100 UNIT/ML injection Inject 60 Units into the skin daily with breakfast.      . insulin lispro (HUMALOG) 100 UNIT/ML injection Inject 7 Units into the skin 3 (three) times daily with meals.      Marland Kitchen losartan (COZAAR) 100 MG tablet Take 100 mg by mouth daily.      . metoprolol succinate (TOPROL-XL) 25 MG 24 hr tablet Take 1 tablet (25 mg total) by mouth 2 (two) times daily.  180 tablet  3  . Multiple Vitamin (MULTIVITAMIN WITH MINERALS) TABS tablet Take 1 tablet by mouth daily.      . nitroGLYCERIN (NITROSTAT) 0.4 MG SL tablet Place 0.4 mg under the tongue every 5 (five) minutes as needed for chest pain.      . polyethylene glycol powder (GLYCOLAX/MIRALAX) powder Take 17 g by mouth daily.  3350 g  1  . potassium chloride SA (K-DUR,KLOR-CON) 20 MEQ tablet Take 20 mEq by mouth 3 (three) times a week. Monday, Wednesday, Friday - at 9am      . saccharomyces boulardii (FLORASTOR) 250 MG capsule Take 250 mg by mouth 2 (two) times daily. Take for 2 weeks starting 02/02/13      . sulfamethoxazole-trimethoprim (BACTRIM DS) 800-160 MG per tablet Take 1 tablet by mouth every 12 (twelve) hours. 7  day course started 02/03/13 (for cellulitis)      . vitamin B-12 (CYANOCOBALAMIN) 1000 MCG tablet Take 1,000 mcg by mouth 2 (two) times daily.       Marland Kitchen warfarin (COUMADIN) 2 MG tablet Take 2 mg by mouth at bedtime. 9pm       Scheduled:  . albuterol  2 mg Oral Once  . etomidate  20 mg Intravenous STAT  .  fentaNYL  50 mcg Intravenous STAT  . furosemide  40 mg Intravenous Q8H  . insulin regular  0-10 Units Intravenous TID WC  . midazolam  2 mg Intravenous STAT  . [START ON 02/08/2013] pantoprazole (PROTONIX) IV  40 mg Intravenous Q12H  . sodium chloride  250 mL Intravenous Once  . sucralfate  1 g Oral Q6H   Infusions:  . dextrose 5 % and 0.45% NaCl 20 mL/hr at 02/05/13 2200  . fentaNYL infusion INTRAVENOUS 75 mcg/hr (02/05/13 2200)  . insulin (NOVOLIN-R) infusion 1 Units/hr (02/05/13 2200)  . norepinephrine (LEVOPHED) Adult infusion    . propofol Stopped (02/05/13 1815)    Assessment: 72yo female admitted 1/18 for acute GIB, now intubated d/t respiratory distress d/t TRALI vs CHF vs volume overload, CXR improved but concerning for PNA, to begin Septra.   Plan:  Will begin Septra 320mg  IV Q8H and monitor CBC, Cx, and clinical progression.  , PharmD, BCPS  02/05/2013,10:59 PM

## 2013-02-05 NOTE — Progress Notes (Signed)
SBP drops into 70s, per MD will stop propofol and paused fentanyl, will monitor.

## 2013-02-05 NOTE — Progress Notes (Signed)
Name: [name] MRN: [id number] DOB: 03-28-1940    ADMISSION DATE:  02/04/2013  REFERRING MD :  EDP  CHIEF COMPLAINT:  GI bleed  BRIEF PATIENT DESCRIPTION: 73 yo woman, history of mechanical heart valve on coumadin, prior upper GI bleed 2012, presents with a 1-day history of black, tarry stools, which progressed to BRBPR, admitted for acute GI bleed.  SIGNIFICANT EVENTS:  1/18 - admitted for acute GI bleed, transfused 2 units on admission 1/19 - GI, Cardiology  STUDIES:   LINES / TUBES: PIV  CULTURES: None  ANTIBIOTICS: None  SUBJECTIVE:  Feels uneasy.  Had spasms in her chest last night.  Denies dyspnea, dizziness, abdominal pain, or nausea.  VITAL SIGNS: Temp:  [97.6 F (36.4 C)-98.6 F (37 C)] 97.8 F (36.6 C) (01/19 0730) Pulse Rate:  [70-109] 94 (01/19 0700) Resp:  [14-26] 17 (01/19 0700) BP: (92-133)/(35-95) 106/41 mmHg (01/19 0700) SpO2:  [97 %-100 %] 100 % (01/19 0700) Weight:  [184 lb 11.9 oz (83.8 kg)] 184 lb 11.9 oz (83.8 kg) (01/19 0700) Room air  INTAKE / OUTPUT: Intake/Output     01/18 0701 - 01/19 0700 01/19 0701 - 01/20 0700   I.V. (mL/kg) 3200 (38.2)    Blood 715 12.5   Other 885    Total Intake(mL/kg) 4800 (57.3) 12.5 (0.1)   Net +4800 +12.5          PHYSICAL EXAMINATION: General: sitting in chair Neuro: sleepy, normal strength HEENT: no sinus tenderness Cardiovascular:  Regular, 2/6 systolic click Lungs: no wheeze Abdomen:  Soft, non tender Musculoskeletal: no edema Skin: no rashes  LABS:  CBC  Recent Labs Lab 02/04/13 1955 02/04/13 2011 02/05/13 0535  WBC 4.1  --  4.1  HGB 7.2* 6.5* 5.6*  HCT 21.2* 19.0* 16.5*  PLT 421*  --  203   Coag's  Recent Labs Lab 02/04/13 1955  INR 2.05*   BMET  Recent Labs Lab 02/04/13 1955 02/04/13 2011 02/05/13 0300 02/05/13 0528  NA 128* 127* 136* 142  K 5.0 4.7 4.9 4.1  CL 90* 94* 103 105  CO2 19  --  22 21  BUN 79* 84* 68* 56*  CREATININE 1.36* 1.50* 1.22* 1.08   GLUCOSE 477* 475* 290* 216*   Electrolytes  Recent Labs Lab 02/04/13 1955 02/05/13 0300 02/05/13 0528  CALCIUM 8.9 8.0* 7.5*   Sepsis Markers  Recent Labs Lab 02/04/13 2044 02/05/13 0535  LATICACIDVEN 3.50* 1.8   Glucose  Recent Labs Lab 02/04/13 1933 02/04/13 2305 02/05/13 0059  GLUCAP 424* 393* 348*    Imaging No results found.  ASSESSMENT / PLAN: GASTROINTESTINAL A:   Acute GI bleed - unclear upper vs lower, but given dark -> bright stools and history of UGI bleed, favor UGI source P:   -GI consulted -NPO in case of procedure -add protonix gtt  PULMONARY A:  History of COPD. P:   -bronchial hygiene -prn BD's  CARDIOVASCULAR A:  Hx of HTN, CAD s/p MI March 2014, s/p [employer] Aortic valve on chronic coumadin, chronic systolic,diastolic  CHF. P:  -hold metoprolol, losartan, lasix for now -hold plavix, given acute bleed -hold coumadin until GI bleed stabilized -cardiology consulted 1/19  RENAL A:   CKD - CR = 1.36, around baseline P:   -monitor renal fx, urine outpt, electrolytes  HEMATOLOGIC A:   Acute blood loss anemia - s/p 2 U pRBC's 1/18 Anticoagulation - on coumadin for mechanical heart valve P:  -f/u CBC, INR -SCD's for  DVT ppx  INFECTIOUS A:   ?R knee infection - reportedly R knee was erythematous on the day prior to admission.  Started Bactrim at [facility] on 1/17. P:   -continue bactrim for full course of treatment  ENDOCRINE A:   Diabetes Mellitus type 2 P:   -insulin drip for DKA -CBG's q1hr and BMET q4 while on insulin drip for DKA -restart Lantus and mealtime insulin once off drip  NEUROLOGIC A:  Acute encephalopathy 2nd to anemia, hypotension P:   -continue to monitor  CC time 35 minutes.  Updated pt's family at bedside.  Coralyn Helling, MD [facility] Pulmonary/Critical Care 02/05/2013, 9:17 AM Pager:  [phone] After 3pm call: [phone]

## 2013-02-05 NOTE — Op Note (Signed)
[facility] [address] [place], [address]   ENDOSCOPY PROCEDURE REPORT  PATIENT: [name], [name]  MR#: [id number] BIRTHDATE: 17-Nov-1940 , 72  yrs. old GENDER: Female ENDOSCOPIST: Hart Carwin, MD REFERRED BY:  Dolores Patty, M.D. PROCEDURE DATE:  02/05/2013 PROCEDURE:  EGD, diagnostic ASA CLASS:     Class III INDICATIONS:  Anemia. ,on Coumadin, last INR 2.0, melena followed by red blood, EF10%, Hgb =6.0 MEDICATIONS: These medications were titrated to patient response per physician's verbal order, Fentanyl-Detailed 25 mg IV, and Versed 2 mg IV TOPICAL ANESTHETIC: none  DESCRIPTION OF PROCEDURE: After the risks benefits and alternatives of the procedure were thoroughly explained, informed consent was obtained.  The PENTAX GASTOROSCOPE [id number] endoscope was introduced through the mouth and advanced to the second portion of the duodenum. Without limitations.  The instrument was slowly withdrawn as the mucosa was fully examined.        ESOPHAGUS: The mucosa of the esophagus appeared normal.  STOMACH: A single non-bleeding punctate ulcer, ranging between 3-35mm in size, with a red spot was found in the gastric antrum.   Few coffee groung speckes floating in the stomach, pyloric outlet normal  DUODENUM: The duodenal mucosa showed no abnormalities in the bulb and second portion of the duodenum.  Retroflexed views revealed no abnormalities.     The scope was then withdrawn from the patient and the procedure completed.  COMPLICATIONS: There were no complications. ENDOSCOPIC IMPRESSION: 1.   The mucosa of the esophagus appeared normal 2.   Single non-bleeding ulcer, ranging between 3-42mm in size, was found in the gastric antrum , red spot indicating recent bleeding 3.   The duodenal mucosa showed no abnormalities in the bulb and second portion of the duodenum  RECOMMENDATIONS: Hold anticoagulants for several days while following  H/H PPI bid continue bowl rest x 24 hours re endoscopy before restarting Coumadin  REPEAT EXAM: yes  eSigned:  Hart Carwin, MD 02/05/2013 2:19 PM   CC:  PATIENT NAME:  Rakesha, Dalporto MR#: [id number]

## 2013-02-05 NOTE — Progress Notes (Signed)
Patient states that she takes cymbalta at home and without taking it she goes into withdrawal without it, updated MD about this, Per MD will hold it until patient is more stable, if patient appears to be in distress, if follow up with MD.  Patient stated that she would like something for anxiety as well, and per MD, will hold off on giving anxiety until patient is more stable.   Currently patient is stable, just not comfortable, provide emotional and physical support as patient allows.  Pain meds have been given when patient states she hurts.    Will monitor.

## 2013-02-05 NOTE — Progress Notes (Signed)
Patient returned to ICU from ENDO lab, patient presented with altered mental status, labored/swallow breathing, patient was confused, clammy, cool, diaphoretic, and extremely restless and anxious.  Patient was placed in bed, sats on the monitor were initially in the upper 70s with a decent Sp02 waveform, patient was placed on NRB on 15 L, CCM MD was notified via E-link.  Lung sounds were wet and crackles auscultated in all fields.  Patient was tachycardia HR in the 130s-150s, SBP in the upper 150s,  CCM MD in the box will send rounding MD.    RN at bedside, remained at bedside the entire duration, pt's daughter was at bedside.

## 2013-02-05 NOTE — Progress Notes (Signed)
CRITICAL VALUE ALERT  Critical value received:  Troponin > 20   Date of notification:  1/19  Time of notification:  1625  Critical value read back:yes  Nurse who received alert:  Herbert Deaner RN  MD notified (1st page):  Molli Knock  Time of first page:  1625  MD notified (2nd page):  Time of second page:  Responding MD:  Molli Knock  Time MD responded:  1630

## 2013-02-05 NOTE — Progress Notes (Signed)
eLink Physician-Brief Progress Note Patient Name: [name] DOB: 23-Jul-1940 MRN: [id number]  Date of Service  02/05/2013   HPI/Events of Note   Hypotensive with propofol  eICU Interventions  Changed to PRN versed.      Daison Braxton 02/05/2013, 6:42 PM

## 2013-02-05 NOTE — Progress Notes (Signed)
EKG was done, called CARDS will results informed that patient is experiencing frequent PVCs and is still hypotensive,  CCM MD Aware as well, will start LEVOPHED drip.

## 2013-02-05 NOTE — Progress Notes (Signed)
eLink Physician-Brief Progress Note Patient Name: [name] DOB: 1940-04-10 MRN: [id number]  Date of Service  02/05/2013   HPI/Events of Note   Tachy and hypertensive.  Remains paralyzed.  eICU Interventions  Propofol and fentanyl drips ordered.      Shayonna Ocampo 02/05/2013, 5:06 PM

## 2013-02-05 NOTE — Progress Notes (Signed)
eLink Physician-Brief Progress Note Patient Name: [name] DOB: [birth year] MRN: [id number]  Date of Service  02/05/2013   HPI/Events of Note  Patient admitted with GIB.  Had received 2 units of pRBC for Hgb of 6.5.  Post-transfusion CBC at 530am is 5.6.  Is currently receiving plasma.  eICU Interventions  Plan: Order to transfuse additional 2 units of pRBC.   Post-transfusion CBC To be seen by GI   Intervention Category Intermediate Interventions: Bleeding - evaluation and treatment with blood products  DETERDING,ELIZABETH 02/05/2013, 6:04 AM

## 2013-02-05 NOTE — Progress Notes (Addendum)
Asked by elink Dr Molli Knock to evaluate patient resp distress. Daugther at bedside who says patient is prone to acute onset CHF. Similar episode at home several months ago and responded quickly to lasix. Not on home lasix aparently. Currently, patient is s/p 4 u PRBC and 2 U FFP for admission 02/04/2013 of UGI bleed. Went to UGI scope and per family even at transfer prior to procedure was develiping resp distress. Got minimal sedative. Scope showed 3-37mm gastric uncler that was non-bleeding. Upon return to 2S ICU patient in respiratory extremis and we are asked to evaluate  Note ef 35% in march 2014  Variable  0 Points 1 Point 2 Points Total  Heart rate per minute  <90 beats 90-109 beats >110 beats 2  Respiratory  Rate per minute < 18 breaths 19-30 breaths  >30 breaths 2  Restlessness; nonpurposeful movements None  occas slight movement Frequent movement 1  Paradoxical breathing pattern: None  Present 2  Accessory muscle use: rise in clavicle during inspiration None Slight rise Pronounced rise 1  Grunting at end-expiration: guttural sound None  Present 2  Nasal flaring: involuntary movement of nares None  Present 2  Look of fear None  Eyes wide 1  Overall total out of 16    13   EXam  Full of crackles - new per RN  BP 107 MAP Some restlesssness  LABS PULMONARY  Recent Labs Lab 02/04/13 2011 02/04/13 2044  HCO3  --  21.8  TCO2 21 23  O2SAT  --  25.0    CBC  Recent Labs Lab 02/04/13 1955 02/04/13 2011 02/05/13 0535  HGB 7.2* 6.5* 5.6*  HCT 21.2* 19.0* 16.5*  WBC 4.1  --  4.1  PLT 421*  --  203    COAGULATION  Recent Labs Lab 02/04/13 1955 02/05/13 1210  INR 2.05* 1.75*    CARDIAC  No results found for this basename: TROPONINI,  in the last 168 hours No results found for this basename: PROBNP,  in the last 168 hours   CHEMISTRY  Recent Labs Lab 02/04/13 1955 02/04/13 2011 02/05/13 0300 02/05/13 0528  NA 128* 127* 136* 142  K 5.0 4.7 4.9 4.1  CL 90* 94*  103 105  CO2 19  --  22 21  GLUCOSE 477* 475* 290* 216*  BUN 79* 84* 68* 56*  CREATININE 1.36* 1.50* 1.22* 1.08  CALCIUM 8.9  --  8.0* 7.5*   Estimated Creatinine Clearance: 48.3 ml/min (by C-G formula based on Cr of 1.08).   LIVER  Recent Labs Lab 02/04/13 1955 02/05/13 1210  INR 2.05* 1.75*     INFECTIOUS  Recent Labs Lab 02/04/13 2044 02/05/13 0535  LATICACIDVEN 3.50* 1.8     ENDOCRINE CBG (last 3)   Recent Labs  02/05/13 0059 02/05/13 1259 02/05/13 1408  GLUCAP 348* 132* 103*         IMAGING x48h  No results found.     A Acute resp failue due to ? TRALI v CHF v Volume overload with GIB resus  Baseline EF 35% Acute Enceophalopathy - RASS +2 to +3  P Start lasix 80mg  IV x 1 Stat bipap Stat cxr  PS Patient got acutely encephalopathic agitated as I was finishing this note. So decision to intubate made    The patient is critically ill with multiple organ systems failure and requires high complexity decision making for assessment and support, frequent evaluation and titration of therapies, application of advanced monitoring technologies and extensive interpretation of  multiple databases.   Critical Care Time devoted to patient care services described in this note is  35  Minutes.  Dr. Kalman Shan, M.D., [facility].C.P Pulmonary and Critical Care Medicine Staff Physician [facility] [place] Pulmonary and Critical Care Pager: [phone], If no answer or between  15:00h - 7:00h: call [phone]  02/05/2013 3:44 PM

## 2013-02-05 NOTE — Progress Notes (Signed)
Patient has needed frequent repositioning bc patients " nothing is comfortable" and when asking patient what can be to help comfort her or help her feel better, patient states " she doesn't know or nothing".  RN has been at bedside with family to help keep patient as comfortable as can be.  Chaplains have been by as well.  Will continue to provide emotional, physical, and psychosocial support.

## 2013-02-05 NOTE — Progress Notes (Signed)
eLink Physician-Brief Progress Note Patient Name: [name] DOB: Dec 13, 1940 MRN: [id number]  Date of Service  02/05/2013   HPI/Events of Note   Congestion  eICU Interventions  Lasix 40 mg IV x1.      Latasia Silberstein 02/05/2013, 3:21 PM

## 2013-02-05 NOTE — Progress Notes (Signed)
Chaplain offered emotional and spiritual support to pt and her two daughters. One daughter is visiting from [place] and the other lives locally. Pt was experiencing a lot of pain in her knee and was being cared for by daughters and her RN. Chaplain provided caring presence and emotional support. Daughters were grateful that chaplain "stopped by."  Maurene Capes, Chaplain [phone] On-Call: [phone]

## 2013-02-05 NOTE — ED Provider Notes (Signed)
Medical screening examination/treatment/procedure(s) were conducted as a shared visit with non-physician practitioner(s) and myself.  I personally evaluated the patient during the encounter.  Maroon stools since earlier today.  On coumadin for mechanical valve, knee replacement on 01/23/13. Generalized weakness. No chest pain or dizziness.  Pale, ill appearing, BP 100s systolic.  Abdomen with mild diffuse tenderness. IVF, Protonix gtt, insulin gtt, blood transfusion. Monitor work of breathing given CHF history.  BP down to 90 systolic. Improved to 130 systolic after 2 liters saline and 2 units rbcs.  D/w Dr. Christella Hartigan of GI per [facility].  PCCM to admit.   EKG Interpretation    Date/Time:  Sunday February 04 2013 21:55:22 EST Ventricular Rate:  89 PR Interval:  187 QRS Duration: 117 QT Interval:  431 QTC Calculation: 524 R Axis:   -26 Text Interpretation:  Sinus rhythm LVH with IVCD and secondary repol abnrm Prolonged QT interval No significant change was found Confirmed by Manus Gunning  MD, Jerusalem Brownstein ([id number]) on 02/04/2013 10:03:58 PM           CRITICAL CARE Performed by: Glynn Octave Total critical care time: 30 Critical care time was exclusive of separately billable procedures and treating other patients. Critical care was necessary to treat or prevent imminent or life-threatening deterioration. Critical care was time spent personally by me on the following activities: development of treatment plan with patient and/or surrogate as well as nursing, discussions with consultants, evaluation of patient's response to treatment, examination of patient, obtaining history from patient or surrogate, ordering and performing treatments and interventions, ordering and review of laboratory studies, ordering and review of radiographic studies, pulse oximetry and re-evaluation of patient's condition.   Glynn Octave, MD 02/05/13 [phone]

## 2013-02-06 ENCOUNTER — Encounter (HOSPITAL_COMMUNITY): Payer: Self-pay | Admitting: Internal Medicine

## 2013-02-06 DIAGNOSIS — R578 Other shock: Secondary | ICD-10-CM

## 2013-02-06 DIAGNOSIS — I359 Nonrheumatic aortic valve disorder, unspecified: Secondary | ICD-10-CM

## 2013-02-06 DIAGNOSIS — J810 Acute pulmonary edema: Secondary | ICD-10-CM

## 2013-02-06 DIAGNOSIS — I472 Ventricular tachycardia: Secondary | ICD-10-CM

## 2013-02-06 DIAGNOSIS — I5043 Acute on chronic combined systolic (congestive) and diastolic (congestive) heart failure: Secondary | ICD-10-CM

## 2013-02-06 DIAGNOSIS — I2589 Other forms of chronic ischemic heart disease: Secondary | ICD-10-CM

## 2013-02-06 DIAGNOSIS — I214 Non-ST elevation (NSTEMI) myocardial infarction: Secondary | ICD-10-CM

## 2013-02-06 DIAGNOSIS — Z96659 Presence of unspecified artificial knee joint: Secondary | ICD-10-CM

## 2013-02-06 DIAGNOSIS — N179 Acute kidney failure, unspecified: Secondary | ICD-10-CM

## 2013-02-06 DIAGNOSIS — I4729 Other ventricular tachycardia: Secondary | ICD-10-CM

## 2013-02-06 LAB — BASIC METABOLIC PANEL
BUN: 52 mg/dL — ABNORMAL HIGH (ref 6–23)
CO2: 21 mEq/L (ref 19–32)
CREATININE: 1.05 mg/dL (ref 0.50–1.10)
Calcium: 7.9 mg/dL — ABNORMAL LOW (ref 8.4–10.5)
Chloride: 107 mEq/L (ref 96–112)
GFR, EST AFRICAN AMERICAN: 60 mL/min — AB (ref >90)
GFR, EST NON AFRICAN AMERICAN: 52 mL/min — AB (ref >90)
Glucose, Bld: 183 mg/dL — ABNORMAL HIGH (ref 70–99)
Potassium: 3.6 mEq/L — ABNORMAL LOW (ref 3.7–5.3)
Sodium: 142 mEq/L (ref 137–147)

## 2013-02-06 LAB — GLUCOSE, CAPILLARY
GLUCOSE-CAPILLARY: 101 mg/dL — AB (ref 70–99)
GLUCOSE-CAPILLARY: 103 mg/dL — AB (ref 70–99)
GLUCOSE-CAPILLARY: 106 mg/dL — AB (ref 70–99)
GLUCOSE-CAPILLARY: 128 mg/dL — AB (ref 70–99)
GLUCOSE-CAPILLARY: 186 mg/dL — AB (ref 70–99)
GLUCOSE-CAPILLARY: 85 mg/dL (ref 70–99)
GLUCOSE-CAPILLARY: 96 mg/dL (ref 70–99)
Glucose-Capillary: 173 mg/dL — ABNORMAL HIGH (ref 70–99)
Glucose-Capillary: 192 mg/dL — ABNORMAL HIGH (ref 70–99)
Glucose-Capillary: 185 mg/dL — ABNORMAL HIGH (ref 70–99)
Glucose-Capillary: 136 mg/dL — ABNORMAL HIGH (ref 70–99)
Glucose-Capillary: 108 mg/dL — ABNORMAL HIGH (ref 70–99)
Glucose-Capillary: 158 mg/dL — ABNORMAL HIGH (ref 70–99)
Glucose-Capillary: 192 mg/dL — ABNORMAL HIGH (ref 70–99)
Glucose-Capillary: 296 mg/dL — ABNORMAL HIGH (ref 70–99)

## 2013-02-06 LAB — TYPE AND SCREEN
ABO/RH(D): A POS
Antibody Screen: NEGATIVE
Unit division: 0
Unit division: 0
Unit division: 0
Unit division: 0

## 2013-02-06 LAB — BLOOD GAS, ARTERIAL
Acid-base deficit: 4.1 mmol/L — ABNORMAL HIGH (ref 0.0–2.0)
BICARBONATE: 19.1 meq/L — AB (ref 20.0–24.0)
Drawn by: 31276
FIO2: 0.4 %
MECHVT: 500 mL
O2 Saturation: 99.9 %
PCO2 ART: 27.5 mmHg — AB (ref 35.0–45.0)
PEEP/CPAP: 5 cmH2O
PH ART: 7.457 — AB (ref 7.350–7.450)
Patient temperature: 98.6
RATE: 24 resp/min
TCO2: 20 mmol/L (ref 0–100)
pO2, Arterial: 171 mmHg — ABNORMAL HIGH (ref 80.0–100.0)

## 2013-02-06 LAB — CBC
HCT: 28.5 % — ABNORMAL LOW (ref 36.0–46.0)
Hemoglobin: 9.9 g/dL — ABNORMAL LOW (ref 12.0–15.0)
MCH: 30.5 pg (ref 26.0–34.0)
MCHC: 34.7 g/dL (ref 30.0–36.0)
MCV: 87.7 fL (ref 78.0–100.0)
Platelets: 272 10*3/uL (ref 150–400)
RBC: 3.25 MIL/uL — AB (ref 3.87–5.11)
RDW: 16.6 % — ABNORMAL HIGH (ref 11.5–15.5)
WBC: 15.8 10*3/uL — ABNORMAL HIGH (ref 4.0–10.5)

## 2013-02-06 LAB — PREPARE FRESH FROZEN PLASMA
UNIT DIVISION: 0
Unit division: 0

## 2013-02-06 LAB — TROPONIN I: Troponin I: >20 ng/mL (ref <0.30)

## 2013-02-06 LAB — PROCALCITONIN: PROCALCITONIN: 1.7 ng/mL

## 2013-02-06 LAB — PROTIME-INR
INR: 2.21 — AB (ref 0.00–1.49)
PROTHROMBIN TIME: 23.8 s — AB (ref 11.6–15.2)

## 2013-02-06 MED ORDER — PANTOPRAZOLE SODIUM 40 MG IV SOLR
40.0000 mg | Freq: Two times a day (BID) | INTRAVENOUS | Status: DC
  Administered 2013-02-06 – 2013-02-08 (×5): 40 mg via INTRAVENOUS
  Filled 2013-02-06 (×8): qty 40

## 2013-02-06 MED ORDER — FENTANYL CITRATE 0.05 MG/ML IJ SOLN: 50.0000 ug | INTRAMUSCULAR | Status: DC | PRN

## 2013-02-06 MED ORDER — ACETAMINOPHEN 650 MG RE SUPP
650.0000 mg | Freq: Four times a day (QID) | RECTAL | Status: DC | PRN
  Administered 2013-02-06: 650 mg via RECTAL
  Filled 2013-02-06: qty 1

## 2013-02-06 MED ORDER — ALBUTEROL SULFATE (2.5 MG/3ML) 0.083% IN NEBU
2.5000 mg | INHALATION_SOLUTION | RESPIRATORY_TRACT | Status: DC | PRN
  Filled 2013-02-06: qty 3

## 2013-02-06 MED ORDER — MIDAZOLAM HCL 2 MG/2ML IJ SOLN
1.0000 mg | INTRAMUSCULAR | Status: DC | PRN
  Administered 2013-02-07: 1 mg via INTRAVENOUS
  Filled 2013-02-06: qty 2

## 2013-02-06 MED ORDER — DEXTROSE-NACL 5-0.45 % IV SOLN
INTRAVENOUS | Status: DC
  Administered 2013-02-06 – 2013-02-07 (×2): via INTRAVENOUS

## 2013-02-06 MED ORDER — INSULIN GLARGINE 100 UNIT/ML ~~LOC~~ SOLN
5.0000 [IU] | Freq: Every day | SUBCUTANEOUS | Status: DC
  Administered 2013-02-06: 5 [IU] via SUBCUTANEOUS
  Filled 2013-02-06 (×2): qty 0.05

## 2013-02-06 MED ORDER — BIOTENE DRY MOUTH MT LIQD
15.0000 mL | Freq: Four times a day (QID) | OROMUCOSAL | Status: DC
  Administered 2013-02-06 – 2013-02-09 (×12): 15 mL via OROMUCOSAL

## 2013-02-06 MED ORDER — DEXMEDETOMIDINE HCL IN NACL 400 MCG/100ML IV SOLN
0.0000 ug/kg/h | INTRAVENOUS | Status: AC
  Administered 2013-02-06: 1.2 ug/kg/h via INTRAVENOUS
  Administered 2013-02-06: 0.5 ug/kg/h via INTRAVENOUS
  Administered 2013-02-06: 0.7 ug/kg/h via INTRAVENOUS
  Administered 2013-02-06: 0.9 ug/kg/h via INTRAVENOUS
  Administered 2013-02-06: 1.2 ug/kg/h via INTRAVENOUS
  Administered 2013-02-06: 0.7 ug/kg/h via INTRAVENOUS
  Administered 2013-02-07: 1.2 ug/kg/h via INTRAVENOUS
  Administered 2013-02-07: 1 ug/kg/h via INTRAVENOUS
  Administered 2013-02-07: 1.2 ug/kg/h via INTRAVENOUS
  Administered 2013-02-07: 1.1 ug/kg/h via INTRAVENOUS
  Administered 2013-02-07: 1 ug/kg/h via INTRAVENOUS
  Administered 2013-02-07: 1.2 ug/kg/h via INTRAVENOUS
  Administered 2013-02-08: 0.7 ug/kg/h via INTRAVENOUS
  Filled 2013-02-06: qty 50
  Filled 2013-02-06 (×2): qty 100
  Filled 2013-02-06 (×2): qty 50
  Filled 2013-02-06 (×2): qty 100
  Filled 2013-02-06: qty 50
  Filled 2013-02-06: qty 100
  Filled 2013-02-06: qty 50
  Filled 2013-02-06: qty 100
  Filled 2013-02-06: qty 50
  Filled 2013-02-06: qty 100

## 2013-02-06 MED ORDER — SODIUM CHLORIDE 0.9 % IV SOLN
0.0000 ug/h | INTRAVENOUS | Status: DC
  Administered 2013-02-06: 125 ug/h via INTRAVENOUS
  Administered 2013-02-07: 300 ug/h via INTRAVENOUS
  Filled 2013-02-06 (×2): qty 50

## 2013-02-06 MED ORDER — CHLORHEXIDINE GLUCONATE 0.12 % MT SOLN
15.0000 mL | Freq: Two times a day (BID) | OROMUCOSAL | Status: DC
Start: 2013-02-06 — End: 2013-02-09
  Administered 2013-02-06 – 2013-02-08 (×5): 15 mL via OROMUCOSAL
  Filled 2013-02-06 (×5): qty 15

## 2013-02-06 MED ORDER — PANTOPRAZOLE SODIUM 40 MG IV SOLR: 40.0000 mg | INTRAVENOUS | Status: DC

## 2013-02-06 MED ORDER — METOPROLOL TARTRATE 1 MG/ML IV SOLN
2.5000 mg | Freq: Two times a day (BID) | INTRAVENOUS | Status: DC
  Administered 2013-02-06 (×2): 2.5 mg via INTRAVENOUS
  Filled 2013-02-06 (×5): qty 5

## 2013-02-06 MED ORDER — INSULIN ASPART 100 UNIT/ML ~~LOC~~ SOLN
0.0000 [IU] | SUBCUTANEOUS | Status: DC
  Administered 2013-02-06: 3 [IU] via SUBCUTANEOUS
  Administered 2013-02-06: 8 [IU] via SUBCUTANEOUS
  Administered 2013-02-06 – 2013-02-07 (×2): 11 [IU] via SUBCUTANEOUS
  Administered 2013-02-07: 3 [IU] via SUBCUTANEOUS
  Administered 2013-02-07: 2 [IU] via SUBCUTANEOUS
  Administered 2013-02-07: 15 [IU] via SUBCUTANEOUS
  Administered 2013-02-07: 11 [IU] via SUBCUTANEOUS
  Administered 2013-02-08 (×2): 2 [IU] via SUBCUTANEOUS
  Administered 2013-02-08: 3 [IU] via SUBCUTANEOUS
  Administered 2013-02-08 (×2): 2 [IU] via SUBCUTANEOUS
  Administered 2013-02-09 (×2): 3 [IU] via SUBCUTANEOUS

## 2013-02-06 MED ORDER — PANTOPRAZOLE SODIUM 40 MG IV SOLR: 40.0000 mg | Freq: Two times a day (BID) | INTRAVENOUS | Status: DC

## 2013-02-06 MED ORDER — IPRATROPIUM-ALBUTEROL 0.5-2.5 (3) MG/3ML IN SOLN
3.0000 mL | RESPIRATORY_TRACT | Status: DC | PRN
  Administered 2013-02-06: 3 mL via RESPIRATORY_TRACT
  Filled 2013-02-06: qty 3

## 2013-02-06 MED ORDER — IPRATROPIUM BROMIDE 0.02 % IN SOLN
0.5000 mg | RESPIRATORY_TRACT | Status: DC | PRN
  Filled 2013-02-06: qty 2.5

## 2013-02-06 NOTE — Progress Notes (Signed)
  Echocardiogram 2D Echocardiogram has been performed.  [name] 02/06/2013, 1:18 PM

## 2013-02-06 NOTE — Progress Notes (Signed)
Subjective:  Sedate intubated. Daughter told me story about rehab, bleeding, EMS.   Objective:  Vital Signs in the last 24 hours: Temp:  [97.1 F (36.2 C)-99.3 F (37.4 C)] 97.7 F (36.5 C) (01/20 1122) Pulse Rate:  [45-156] 78 (01/20 1143) Resp:  [14-29] 21 (01/20 1143) BP: (67-203)/(13-105) 106/40 mmHg (01/20 1143) SpO2:  [72 %-100 %] 100 % (01/20 1143) FiO2 (%):  [40 %-60 %] 40 % (01/20 1143) Weight:  [173 lb 11.6 oz (78.8 kg)] 173 lb 11.6 oz (78.8 kg) (01/20 0500)  Intake/Output from previous day: 01/19 0701 - 01/20 0700 In: 3124.6 [I.V.:1309.6; Blood:775; IV Piggyback:1040] Out: 3441 [Urine:3440; Stool:1]  . antiseptic oral rinse  15 mL Mouth Rinse QID  . chlorhexidine  15 mL Mouth/Throat BID  . insulin aspart  0-15 Units Subcutaneous Q4H  . insulin glargine  5 Units Subcutaneous Daily  . metoprolol  2.5 mg Intravenous Q12H  . midazolam  2 mg Intravenous STAT  . [START ON 02/08/2013] pantoprazole (PROTONIX) IV  40 mg Intravenous Q12H  . sodium chloride  250 mL Intravenous Once  . sucralfate  1 g Oral Q6H  . sulfamethoxazole-trimethoprim  320 mg Intravenous Q8H   Physical Exam: General: Sedate on vent.  Head:  Normocephalic and atraumatic.ETT Lungs: Mild rhonchi, vent noise across lung fields.  Heart: Normal S1 and metallic S2.  No murmur, rubs or gallops.  Abdomen: soft, non-tender, positive bowel sounds.Mildly distended.  Extremities: No clubbing or cyanosis. No edema. S/p R TKA Neurologic: Alert and oriented x 3.    Lab Results:  Recent Labs  02/05/13 1625 02/06/13 0300  WBC 30.0* 15.8*  HGB 11.7* 9.9*  PLT 369 272    Recent Labs  02/05/13 1625 02/06/13 0300  NA 141 142  K 3.7 3.6*  CL 106 107  CO2 19 21  GLUCOSE 203* 183*  BUN 55* 52*  CREATININE 1.09 1.05    Recent Labs  02/06/13 0020 02/06/13 0743  TROPONINI >20.00* >20.00*   Hepatic FuImaging: Dg Chest Port 1 View  02/05/2013   CLINICAL DATA:  Right IJ line placement.  EXAM:  PORTABLE CHEST - 1 VIEW  COMPARISON:  Chest radiograph performed earlier today at 4:33 p.m.  FINDINGS: The patient's endotracheal tube is seen ending approximately 2 cm above the carina. A a right IJ line is noted ending about the mid to distal SVC.  The lungs are relatively well expanded. Vascular congestion is seen, improved from the prior study. Mild right-sided opacities may reflect pneumonia or possibly mild residual edema. A small left pleural effusion suspected. No pneumothorax is seen.  The cardiomediastinal silhouette is borderline normal in size. The patient is status post median sternotomy, with evidence of prior CABG. No acute osseous abnormalities are seen.  IMPRESSION: 1. Right IJ line seen ending about the mid to distal SVC. 2. No evidence of pneumothorax. 3. Vascular congestion has improved from the prior study; mild right-sided airspace opacities may reflect pneumonia or possibly mild residual edema. Small left pleural effusion suspected.   Electronically Signed   By: Roanna Raider M.D.   On: 02/05/2013 22:58   Dg Chest Port 1 View  02/05/2013   CLINICAL DATA:  Respiratory overload  EXAM: PORTABLE CHEST - 1 VIEW  COMPARISON:  01/16/2013  FINDINGS: Endotracheal tube in good position.  Severe bilateral airspace disease most likely edema however pneumonia not excluded. Prior CABG.  IMPRESSION: Extensive diffuse bilateral airspace disease, most likely edema.   Electronically Signed   By:  Marlan Palau M.D.   On: 02/05/2013 16:53   Personally viewed.   Telemetry: No adverse rhythms.  Personally viewed.   EKG:  NSR, LVH, ST depression inferior/lateral leads. Fairly similar to priors.   Cardiac Studies:  ECHO: 3/14  - EF 35% Moderate hypokinesis of the mid-distalanteroseptal, anterior, and apical myocardium. Moderate hypokinesis of the basal-midinferior and inferoseptal myocardium.  Assessment/Plan:  Active Problems:   Acute GI bleeding   Acute respiratory failure with hypoxia    Encephalopathy acute  73 year old with GI bleed, shock, mechanical AV, CAD with prior protected Left Main stent, chronic systolic heart failure, ischemic cardiomyopathy now with NSTEMI (Trop >20).   1) NSTEMI - a result of shock, GIB, severe anemia with underlying severe CAD. Can not use heparin obviously with recent bleeding episode. Reviewed Dr. Regino Schultze note and she is OK with restarting Coumadin and Plavix. Will continue to avoid ASA. I am in agreement for restarting Plavix and coumadin but will defer to CCM given there primary lead in her care.   Had prior MI in 3/14.   Once she is over this episode and hopefully stable, consider angiogram to assess coronary anatomy (prior LM stent).   2) Mechanical AV - agree with Dr. Gala Romney. Less likely to form thombus if anticoagulation is discontinued. Hopefully we can restart coumadin without heparin bridge.   3) Cardiomyopathy - checking ECHO. Prior EF 35%. Obviously holding lasix and losartan in setting of recent shock.   4) History of cardiac arrest (ischemic torsades de pointes) and received a drug-eluting stent to a protected leftmain coronary artery).  5) GIB - PPI, per GI.   Discussed with daughter at length. Understands the gravity of the situation. NSTEMI obviously increases overall mortality in this situation.   Critical care time spend with patient, family, review of records and labs.   SKAINS, MARK 02/06/2013, 11:56 AM

## 2013-02-06 NOTE — Progress Notes (Signed)
INITIAL NUTRITION ASSESSMENT  DOCUMENTATION CODES Per approved criteria  -Obesity Unspecified   INTERVENTION:  If nutrition support warranted, consider post-pyloric feeding tube placement for EN initiation RD to follow for nutrition care plan  NUTRITION DIAGNOSIS: Inadequate oral intake related to inability to eat as evidenced by NPO status  Goal: Pt to meet >/= 90% of their estimated nutrition needs   Monitor:  EN initiation & tolerance, respiratory status, weight, labs, I/O's  Reason for Assessment: VDRF  73 y.o. female  Admitting Dx: GI bleed  ASSESSMENT: Patient with history of mechanical heart valve on coumadin, prior upper GI bleed 2012, presents with a 1-day history of black, tarry stools, which progressed to BRBPR, admitted for acute GI bleed.  Patient s/p EGD 1/19 -- impression -- single, non-bleeding ulcer in gastric antrum   Patient is currently intubated on ventilator support MV: 11.8 L/min Temp (24hrs), Avg:98.5 F (36.9 C), Min:97.1 F (36.2 C), Max:99.3 F (37.4 C)   RD unable to obtain nutrition hx or complete Nutrition Focused Physical Exam at this time.  Height: Ht Readings from Last 1 Encounters:  02/04/13 5\' 3"  (1.6 m)    Weight: Wt Readings from Last 1 Encounters:  02/06/13 173 lb 11.6 oz (78.8 kg)    Ideal Body Weight: 115 lb  % Ideal Body Weight: 150%  Wt Readings from Last 10 Encounters:  02/06/13 173 lb 11.6 oz (78.8 kg)  02/06/13 173 lb 11.6 oz (78.8 kg)  01/30/13 183 lb (83.008 kg)  01/27/13 183 lb 6.8 oz (83.2 kg)  01/27/13 183 lb 6.8 oz (83.2 kg)  01/16/13 175 lb 8 oz (79.606 kg)  12/26/12 178 lb (80.74 kg)  11/08/12 174 lb 11.2 oz (79.243 kg)  10/25/12 177 lb (80.287 kg)  09/06/12 178 lb (80.74 kg)    Usual Body Weight: 175 lb  % Usual Body Weight: 98%  BMI:  Body mass index is 30.78 kg/(m^2).  Estimated Nutritional Needs: Kcal: 1600 Protein: 100-110 gm Fluid: per MD  Skin: Stage I pressure ulcer to  sacrum  Diet Order: NPO  EDUCATION NEEDS: -No education needs identified at this time   Intake/Output Summary (Last 24 hours) at 02/06/13 1107 Last data filed at 02/06/13 1100  Gross per 24 hour  Intake 2188.5 ml  Output   3955 ml  Net -1766.5 ml    Labs:   Recent Labs Lab 02/05/13 0528 02/05/13 1625 02/06/13 0300  NA 142 141 142  K 4.1 3.7 3.6*  CL 105 106 107  CO2 21 19 21   BUN 56* 55* 52*  CREATININE 1.08 1.09 1.05  CALCIUM 7.5* 8.2* 7.9*  MG  --  1.9  --   PHOS  --  4.3  --   GLUCOSE 216* 203* 183*    CBG (last 3)   Recent Labs  02/06/13 0707 02/06/13 0750 02/06/13 0828  GLUCAP 108* 158* 186*    Scheduled Meds: . antiseptic oral rinse  15 mL Mouth Rinse QID  . chlorhexidine  15 mL Mouth/Throat BID  . insulin aspart  0-15 Units Subcutaneous Q4H  . insulin glargine  5 Units Subcutaneous Daily  . metoprolol  2.5 mg Intravenous Q12H  . midazolam  2 mg Intravenous STAT  . [START ON 02/08/2013] pantoprazole (PROTONIX) IV  40 mg Intravenous Q12H  . sodium chloride  250 mL Intravenous Once  . sucralfate  1 g Oral Q6H  . sulfamethoxazole-trimethoprim  320 mg Intravenous Q8H    Continuous Infusions: . dexmedetomidine 0.7 mcg/kg/hr (02/06/13 0850)  .  dextrose 5 % and 0.45% NaCl 20 mL/hr at 02/06/13 1107  . fentaNYL infusion INTRAVENOUS 125 mcg/hr (02/06/13 0851)  . norepinephrine (LEVOPHED) Adult infusion 5 mcg/min (02/06/13 1016)    Past Medical History  Diagnosis Date  . High blood pressure   . Irregular heart rhythm   . High cholesterol   . Coronary artery disease   . COPD (chronic obstructive pulmonary disease)   . Anemia   . Irregular heart beat   . Myocardial infarction march 2013  . Peripheral vascular disease   . Cardiac arrest   . CHF (congestive heart failure) 01/06/12    Transthoracic Echocardiogram EF 45-50%  . Memory deficits 09/06/2012  . Polyneuropathy in diabetes(357.2)   . Degenerative arthritis   . Fibromyalgia   . Diabetes  mellitus     Type II  . Heart murmur   . Angina at rest     no new symptoms since 11/08/12 (01/17/13)  . DM type 2 (diabetes mellitus, type 2)     Past Surgical History  Procedure Laterality Date  . Gallbadder  2003  . Cardiac valve replacement  1991  . Heart bypass  1991/2005  . Angioplasty    . Coronary artery bypass graft    . Aortic valve replacement    . Cholecystectomy    . Esophagogastroduodenoscopy  03/31/2011    Procedure: ESOPHAGOGASTRODUODENOSCOPY (EGD);  Surgeon: Beverley Fiedler, MD;  Location: [facility] ENDOSCOPY;  Service: Gastroenterology;  Laterality: N/A;  . Bypass graft  1991 and 2005  . Toenail avulsion Left   . Total knee arthroplasty Right 01/23/2013    Procedure: RIGHT TOTAL KNEE ARTHROPLASTY;  Surgeon: Valeria Batman, MD;  Location: [facility] OR;  Service: Orthopedics;  Laterality: Right;  . Esophagogastroduodenoscopy N/A 02/05/2013    Procedure: ESOPHAGOGASTRODUODENOSCOPY (EGD);  Surgeon: Hart Carwin, MD;  Location: [facility] ENDOSCOPY;  Service: Endoscopy;  Laterality: N/A;    Maureen Chatters, RD, LDN Pager #: [phone] After-Hours Pager #: [phone]

## 2013-02-06 NOTE — Progress Notes (Signed)
eLink Physician-Brief Progress Note Patient Name: [name] DOB: [birth year] MRN: [id number]  Date of Service  02/06/2013   HPI/Events of Note  Patient with elevated trop of greater than 20.  ECHO/EKG had been ordered.  HD stable but BP is on the lower side.  Had resp acidosis on ABG earlier with some vent changes made.   eICU Interventions  Plan: Attempt to start BB given elevated trop Hold on ASA/heparin given GIB Recheck ABG Consider cardiology consult   Intervention Category Intermediate Interventions: Diagnostic test evaluation  DETERDING,ELIZABETH 02/06/2013, 1:03 AM

## 2013-02-06 NOTE — Progress Notes (Signed)
Daily Rounding Note  02/06/2013, 8:31 AM  LOS: 2 days   SUBJECTIVE:       Intubated last night due to resp distress/volume overload.  No BMs.  No Vomiting.  No NGT in place  OBJECTIVE:         Vital signs in last 24 hours:    Temp:  [97.1 F (36.2 C)-99.3 F (37.4 C)] 99.3 F (37.4 C) (01/20 0732) Pulse Rate:  [45-156] 88 (01/20 0800) Resp:  [14-29] 24 (01/20 0800) BP: (71-203)/(13-105) 95/43 mmHg (01/20 0800) SpO2:  [72 %-100 %] 100 % (01/20 0800) FiO2 (%):  [40 %-60 %] 40 % (01/20 0823) Weight:  [78.8 kg (173 lb 11.6 oz)] 78.8 kg (173 lb 11.6 oz) (01/20 0500)   General: looks ill.  Responding to voice   Heart: RRR with valve snap Chest: ronchi diffusely.  ET tube in place Abdomen: soft, NT, ND, obese.  BS hypoactive.  Multiple bruises on abdomen  Extremities: non-pitting LE ecdema, >  right Neuro/Psych:  Follows simple commands, arouses to voice.   Intake/Output from previous day: 01/19 0701 - 01/20 0700 In: 3124.6 [I.V.:1309.6; Blood:775; IV Piggyback:1040] Out: 3441 [Urine:3440; Stool:1]  Intake/Output this shift: Total I/O In: 35.9 [I.V.:35.9] Out: 500 [Urine:500]  Lab Results:  Recent Labs  02/05/13 0535 02/05/13 1625 02/06/13 0300  WBC 4.1 30.0* 15.8*  HGB 5.6* 11.7* 9.9*  HCT 16.5* 33.5* 28.5*  PLT 203 369 272   BMET  Recent Labs  02/05/13 0528 02/05/13 1625 02/06/13 0300  NA 142 141 142  K 4.1 3.7 3.6*  CL 105 106 107  CO2 21 19 21   GLUCOSE 216* 203* 183*  BUN 56* 55* 52*  CREATININE 1.08 1.09 1.05  CALCIUM 7.5* 8.2* 7.9*   PT/INR  Recent Labs  02/05/13 1210 02/06/13 0300  LABPROT 19.9* 23.8*  INR 1.75* 2.21*    Studies/Results: Dg Chest Port 1 View  02/05/2013   CLINICAL DATA:  Right IJ line placement.  EXAM: PORTABLE CHEST - 1 VIEW  COMPARISON:  Chest radiograph performed earlier today at 4:33 p.m.  FINDINGS: The patient's endotracheal tube is seen ending  approximately 2 cm above the carina. A a right IJ line is noted ending about the mid to distal SVC.  The lungs are relatively well expanded. Vascular congestion is seen, improved from the prior study. Mild right-sided opacities may reflect pneumonia or possibly mild residual edema. A small left pleural effusion suspected. No pneumothorax is seen.  The cardiomediastinal silhouette is borderline normal in size. The patient is status post median sternotomy, with evidence of prior CABG. No acute osseous abnormalities are seen.  IMPRESSION: 1. Right IJ line seen ending about the mid to distal SVC. 2. No evidence of pneumothorax. 3. Vascular congestion has improved from the prior study; mild right-sided airspace opacities may reflect pneumonia or possibly mild residual edema. Small left pleural effusion suspected.   Electronically Signed   By: 02/07/2013 M.D.   On: 02/05/2013 22:58   Dg Chest Port 1 View  02/05/2013   CLINICAL DATA:  Respiratory overload  EXAM: PORTABLE CHEST - 1 VIEW  COMPARISON:  01/16/2013  FINDINGS: Endotracheal tube in good position.  Severe bilateral airspace disease most likely edema however pneumonia not excluded. Prior CABG.  IMPRESSION: Extensive diffuse bilateral airspace disease, most likely edema.   Electronically Signed   By: 01/18/2013 M.D.   On: 02/05/2013 16:53    ASSESMENT:   *  Acute upper GI bleed.  EGD 1/19 with single non-bleeding antral gastric ulcer.  Hx of bleeding PUD 2009 and 2013.  Had not been getting PPI for an unknown period of time. On BID IV Protonix and qid Carafate (not receiving due to intubation and no gastric tube yet in place)  * ABL anemia. S/p 4 units PRBC.  * Chronic Counmadin and Plavix, ASA. S/p FFP to correct coags.  *  CHF, acute. Intubated with volume overload.   * S/p right TKR. Doing well with walker assisted ambulation at SNF. On Bactrim for some erythema at wound site.  * CAD with multiple surgeries and interventions on mutiple blood  "thinnners" Non STEMI this admission. troponins elevated > 20.   *  COPD.  ? Acute HAP required PNA? Intubated 1/19 post EGD.  * S/p AVR.     PLAN   *  LIFELONG PPI.  Ok to stop Carafate at discharge.  *  Restart Coumadin, Plavix.  Will it be possible to hold ASA for at least 7 day?, ideally would hold for 2 weeks.  *  Would be ok to start tube feeds if you anticipate she will remain intubated for > 24 hours.  O/w when extubated advance diet as tolerated if swallowing not compromised.  *  when taking po:  Begin once daily PPI *  Await stool H pylori Ag test.  Has been H pylori negative on gastric bx in past.    [name]  02/06/2013, 8:31 AM Pager: [phone] Attending MD note:   I have taken a history, examined the patient, and reviewed the chart. I agree with the Advanced Practitioner's impression and recommendations. OK to restart Coumadin/Plavix since high risk for embolic event.   Willa Rough [facility]enter[facility] Pager # [phone]

## 2013-02-06 NOTE — Progress Notes (Signed)
Name: [name] MRN: [id number] DOB: 1940-11-24    ADMISSION DATE:  02/04/2013  REFERRING MD :  EDP  CHIEF COMPLAINT:  GI bleed  BRIEF PATIENT DESCRIPTION: 73 yo woman, history of mechanical heart valve on coumadin, prior upper GI bleed 2012, presents with a 1-day history of black, tarry stools, which progressed to BRBPR, admitted for acute GI bleed.  SIGNIFICANT EVENTS:  1/18 - admitted for acute GI bleed, transfused 2 units on admission 1/19 - GI, Cardiology; EGD; Respiratory distress >> VDRF  STUDIES: 1/19 - EGD >> gastric ulcer  LINES / TUBES: ETT 1/19 >>  Rt IJ CVL 1/19 >>   CULTURES: Sputum Cx 1/20 >>   ANTIBIOTICS: Bactrim >>   SUBJECTIVE:  Events of last night noted.  She denies chest/abd pain.  VITAL SIGNS: Temp:  [97.1 F (36.2 C)-99.3 F (37.4 C)] 99.3 F (37.4 C) (01/20 0732) Pulse Rate:  [45-156] 88 (01/20 0800) Resp:  [14-29] 24 (01/20 0800) BP: (71-203)/(13-105) 95/43 mmHg (01/20 0800) SpO2:  [72 %-100 %] 100 % (01/20 0800) FiO2 (%):  [40 %-60 %] 40 % (01/20 0742) Weight:  [173 lb 11.6 oz (78.8 kg)] 173 lb 11.6 oz (78.8 kg) (01/20 0500) VENT SETTINGS: Vent Mode:  [-] PRVC FiO2 (%):  [40 %-60 %] 40 % Set Rate:  [14 bmp-24 bmp] 24 bmp Vt Set:  [500 mL] 500 mL PEEP:  [5 cmH20] 5 cmH20 Plateau Pressure:  [11 cmH20-26 cmH20] 24 cmH20 INTAKE / OUTPUT: Intake/Output     01/19 0701 - 01/20 0700 01/20 0701 - 01/21 0700   I.V. (mL/kg) 1309.6 (16.6) 30 (0.4)   Blood 775    Other     IV Piggyback 1040    Total Intake(mL/kg) 3124.6 (39.7) 30 (0.4)   Urine (mL/kg/hr) 3440 (1.8)    Stool 1 (0)    Total Output 3441     Net -316.4 +30          PHYSICAL EXAMINATION: General: no distress Neuro: sedated, RASS -1, follows simple commands, agitated with WUA HEENT: ETT in place Cardiovascular:  Regular, 2/6 systolic click Lungs: scattered rhonchi Abdomen:  Soft, non tender Musculoskeletal: no edema Skin: no rashes  LABS:  CBC  Recent  Labs Lab 02/05/13 0535 02/05/13 1625 02/06/13 0300  WBC 4.1 30.0* 15.8*  HGB 5.6* 11.7* 9.9*  HCT 16.5* 33.5* 28.5*  PLT 203 369 272   Coag's  Recent Labs Lab 02/04/13 1955 02/05/13 1210 02/06/13 0300  INR 2.05* 1.75* 2.21*   BMET  Recent Labs Lab 02/05/13 0528 02/05/13 1625 02/06/13 0300  NA 142 141 142  K 4.1 3.7 3.6*  CL 105 106 107  CO2 21 19 21   BUN 56* 55* 52*  CREATININE 1.08 1.09 1.05  GLUCOSE 216* 203* 183*   Electrolytes  Recent Labs Lab 02/05/13 0528 02/05/13 1625 02/06/13 0300  CALCIUM 7.5* 8.2* 7.9*  MG  --  1.9  --   PHOS  --  4.3  --    Sepsis Markers  Recent Labs Lab 02/04/13 2044 02/05/13 0535 02/05/13 1543 02/05/13 1625 02/06/13 0300  LATICACIDVEN 3.50* 1.8 3.3*  --   --   PROCALCITON  --   --   --  0.38 1.70   Glucose  Recent Labs Lab 02/05/13 1859 02/05/13 2001 02/05/13 2051 02/05/13 2154 02/05/13 2300 02/06/13 0008  GLUCAP 98 59* 101* 85 103* 128*    Imaging Dg Chest Port 1 View  02/05/2013   CLINICAL DATA:  Right  IJ line placement.  EXAM: PORTABLE CHEST - 1 VIEW  COMPARISON:  Chest radiograph performed earlier today at 4:33 p.m.  FINDINGS: The patient's endotracheal tube is seen ending approximately 2 cm above the carina. A a right IJ line is noted ending about the mid to distal SVC.  The lungs are relatively well expanded. Vascular congestion is seen, improved from the prior study. Mild right-sided opacities may reflect pneumonia or possibly mild residual edema. A small left pleural effusion suspected. No pneumothorax is seen.  The cardiomediastinal silhouette is borderline normal in size. The patient is status post median sternotomy, with evidence of prior CABG. No acute osseous abnormalities are seen.  IMPRESSION: 1. Right IJ line seen ending about the mid to distal SVC. 2. No evidence of pneumothorax. 3. Vascular congestion has improved from the prior study; mild right-sided airspace opacities may reflect pneumonia or  possibly mild residual edema. Small left pleural effusion suspected.   Electronically Signed   By: Roanna Raider M.D.   On: 02/05/2013 22:58   Dg Chest Port 1 View  02/05/2013   CLINICAL DATA:  Respiratory overload  EXAM: PORTABLE CHEST - 1 VIEW  COMPARISON:  01/16/2013  FINDINGS: Endotracheal tube in good position.  Severe bilateral airspace disease most likely edema however pneumonia not excluded. Prior CABG.  IMPRESSION: Extensive diffuse bilateral airspace disease, most likely edema.   Electronically Signed   By: Marlan Palau M.D.   On: 02/05/2013 16:53    ASSESSMENT / PLAN: GASTROINTESTINAL A:   Acute GI bleed 2nd to gastric ulcer. P:   -protonix BID -advance diet per GI  PULMONARY A:  Acute respiratory failure 2nd to acute pulmonary edema. History of COPD. P:   -full vent support for now -f/u CXR -prn BD's  CARDIOVASCULAR A:  Transient hypotension after intubation 1/19 >> likely from sedation. NSTEMI from demand ischemia 1/19. Hx of HTN, CAD s/p MI March 2014, s/p [employer] Aortic valve on chronic coumadin, chronic systolic,diastolic  CHF. P:  -continue IV lopressor -hold lasix, losartan for now -f/u Echo -hold plavix, given acute bleed -hold coumadin until GI bleed stabilized  RENAL A:   CKD - CR = 1.36, around baseline P:   -monitor renal fx, urine outpt, electrolytes  HEMATOLOGIC A:   Acute blood loss anemia - s/p 2 U pRBC's 1/18 Anticoagulation - on coumadin for mechanical heart valve P:  -f/u CBC, INR -SCD's for DVT ppx  INFECTIOUS A:   ?R knee infection - reportedly R knee was erythematous on the day prior to admission.  Started Bactrim at [facility] on 1/17. Possible bronchitis. P:   -continue bactrim  ENDOCRINE A:   Diabetes Mellitus type 2 P:   -transition to SSI with 5 units lantus  NEUROLOGIC A:  Acute encephalopathy 2nd to anemia, hypotension P:   -continue to monitor  CC time 35 minutes.  Coralyn Helling, MD [facility] Pulmonary/Critical  Care 02/06/2013, 8:10 AM Pager:  [phone] After 3pm call: [phone]

## 2013-02-06 NOTE — Progress Notes (Signed)
eLink Physician-Brief Progress Note Patient Name: JANEKA LIBMAN DOB: [birth year] MRN: [id number]  Date of Service  02/06/2013   HPI/Events of Note   Fever, no OGT/NGT.  eICU Interventions  Tylenol given PR.      YACOUB,WESAM 02/06/2013, 8:04 PM

## 2013-02-06 NOTE — Progress Notes (Signed)
MD ordered placement of Central Line.  Patient unable to give consent because intubated and sedated.  Patient's daughter, Lolita Cram, called and informed of plan.  The procedure and indications were explained and the daughter denied having questions.  Consent was obtained for the procedure from the daughter.  Two nurses, myself and Herbert Deaner, verified the daughter gave consent.

## 2013-02-07 ENCOUNTER — Inpatient Hospital Stay (HOSPITAL_COMMUNITY): Payer: Medicare Other

## 2013-02-07 DIAGNOSIS — I519 Heart disease, unspecified: Secondary | ICD-10-CM

## 2013-02-07 DIAGNOSIS — IMO0002 Reserved for concepts with insufficient information to code with codable children: Secondary | ICD-10-CM

## 2013-02-07 DIAGNOSIS — F3289 Other specified depressive episodes: Secondary | ICD-10-CM

## 2013-02-07 DIAGNOSIS — F329 Major depressive disorder, single episode, unspecified: Secondary | ICD-10-CM

## 2013-02-07 DIAGNOSIS — K259 Gastric ulcer, unspecified as acute or chronic, without hemorrhage or perforation: Secondary | ICD-10-CM

## 2013-02-07 LAB — GLUCOSE, CAPILLARY
GLUCOSE-CAPILLARY: 346 mg/dL — AB (ref 70–99)
Glucose-Capillary: 110 mg/dL — ABNORMAL HIGH (ref 70–99)
Glucose-Capillary: 134 mg/dL — ABNORMAL HIGH (ref 70–99)
Glucose-Capillary: 160 mg/dL — ABNORMAL HIGH (ref 70–99)
Glucose-Capillary: 313 mg/dL — ABNORMAL HIGH (ref 70–99)
Glucose-Capillary: 331 mg/dL — ABNORMAL HIGH (ref 70–99)
Glucose-Capillary: 376 mg/dL — ABNORMAL HIGH (ref 70–99)

## 2013-02-07 LAB — BASIC METABOLIC PANEL
BUN: 34 mg/dL — ABNORMAL HIGH (ref 6–23)
CHLORIDE: 99 meq/L (ref 96–112)
CO2: 20 mEq/L (ref 19–32)
Calcium: 7.9 mg/dL — ABNORMAL LOW (ref 8.4–10.5)
Creatinine, Ser: 1.2 mg/dL — ABNORMAL HIGH (ref 0.50–1.10)
GFR calc Af Amer: 51 mL/min — ABNORMAL LOW (ref >90)
GFR calc non Af Amer: 44 mL/min — ABNORMAL LOW (ref >90)
GLUCOSE: 356 mg/dL — AB (ref 70–99)
POTASSIUM: 3.9 meq/L (ref 3.7–5.3)
SODIUM: 134 meq/L — AB (ref 137–147)

## 2013-02-07 LAB — CULTURE, RESPIRATORY

## 2013-02-07 LAB — CBC
HEMATOCRIT: 26 % — AB (ref 36.0–46.0)
HEMOGLOBIN: 9.2 g/dL — AB (ref 12.0–15.0)
MCH: 31.5 pg (ref 26.0–34.0)
MCHC: 35.4 g/dL (ref 30.0–36.0)
MCV: 89 fL (ref 78.0–100.0)
Platelets: 280 10*3/uL (ref 150–400)
RBC: 2.92 MIL/uL — AB (ref 3.87–5.11)
RDW: 16.1 % — ABNORMAL HIGH (ref 11.5–15.5)
WBC: 10.9 10*3/uL — ABNORMAL HIGH (ref 4.0–10.5)

## 2013-02-07 LAB — CULTURE, RESPIRATORY W GRAM STAIN

## 2013-02-07 LAB — PROCALCITONIN: Procalcitonin: 1.02 ng/mL

## 2013-02-07 MED ORDER — FENTANYL CITRATE 0.05 MG/ML IJ SOLN
100.0000 ug | Freq: Once | INTRAMUSCULAR | Status: AC
  Administered 2013-02-07: 100 ug via INTRAVENOUS
  Filled 2013-02-07: qty 2

## 2013-02-07 MED ORDER — INSULIN GLARGINE 100 UNIT/ML ~~LOC~~ SOLN
15.0000 [IU] | Freq: Every day | SUBCUTANEOUS | Status: DC
  Administered 2013-02-07 – 2013-02-08 (×2): 15 [IU] via SUBCUTANEOUS
  Filled 2013-02-07 (×4): qty 0.15

## 2013-02-07 MED ORDER — DULOXETINE HCL 60 MG PO CPEP
60.0000 mg | ORAL_CAPSULE | Freq: Every day | ORAL | Status: DC
  Administered 2013-02-07 – 2013-02-09 (×3): 60 mg via ORAL
  Filled 2013-02-07 (×3): qty 1

## 2013-02-07 MED ORDER — GABAPENTIN 300 MG PO CAPS
300.0000 mg | ORAL_CAPSULE | Freq: Three times a day (TID) | ORAL | Status: DC | PRN
  Administered 2013-02-07 – 2013-02-08 (×4): 300 mg via ORAL
  Filled 2013-02-07 (×4): qty 1

## 2013-02-07 MED ORDER — FENTANYL CITRATE 0.05 MG/ML IJ SOLN
25.0000 ug | INTRAMUSCULAR | Status: DC | PRN
  Administered 2013-02-07 – 2013-02-09 (×7): 25 ug via INTRAVENOUS
  Filled 2013-02-07 (×8): qty 2

## 2013-02-07 MED ORDER — FUROSEMIDE 10 MG/ML IJ SOLN
20.0000 mg | Freq: Once | INTRAMUSCULAR | Status: AC
  Administered 2013-02-07: 20 mg via INTRAVENOUS
  Filled 2013-02-07: qty 2

## 2013-02-07 MED ORDER — LEVOFLOXACIN IN D5W 750 MG/150ML IV SOLN
750.0000 mg | INTRAVENOUS | Status: DC
  Administered 2013-02-07 – 2013-02-09 (×2): 750 mg via INTRAVENOUS
  Filled 2013-02-07 (×2): qty 150

## 2013-02-07 NOTE — Progress Notes (Signed)
Name: [name] MRN: [id number] DOB: [birth year]    ADMISSION DATE:  02/04/2013  REFERRING MD :  EDP  CHIEF COMPLAINT:  GI bleed  BRIEF PATIENT DESCRIPTION: 73 yo woman, history of mechanical heart valve on coumadin, prior upper GI bleed 2012, presents with a 1-day history of black, tarry stools, which progressed to [facility], admitted for acute GI bleed.  SIGNIFICANT EVENTS:  1/18 - admitted for acute GI bleed, transfused 2 units on admission 1/19 - GI, Cardiology; EGD; Respiratory distress >> VDRF 1/20 - added precedex for sedation 1/21 - Fever 102.6, increased respiratory secretions  STUDIES: 1/19 - EGD >> gastric ulcer  LINES / TUBES: ETT 1/19 >>  Rt IJ CVL 1/19 >>   CULTURES: Sputum Cx 1/20 >>   ANTIBIOTICS: Bactrim >> 1/21 Levaquin 1/21 >>   SUBJECTIVE:  Tolerating pressure support better.  Increased respiratory secretions noted, and Tm 102.6.  Required more sedation overnight, and remains on pressors.  VITAL SIGNS: Temp:  [97.7 F (36.5 C)-102.6 F (39.2 C)] 100.1 F (37.8 C) (01/21 0400) Pulse Rate:  [53-157] 76 (01/21 0500) Resp:  [13-26] 14 (01/21 0500) BP: (67-140)/(34-103) 126/46 mmHg (01/21 0500) SpO2:  [94 %-100 %] 100 % (01/21 0500) FiO2 (%):  [40 %] 40 % (01/21 0600) Weight:  [175 lb 11.3 oz (79.7 kg)] 175 lb 11.3 oz (79.7 kg) (01/21 0500) VENT SETTINGS: Vent Mode:  [-] PRVC FiO2 (%):  [40 %] 40 % Set Rate:  [16 bmp] 16 bmp Vt Set:  [500 mL] 500 mL PEEP:  [5 cmH20] 5 cmH20 Plateau Pressure:  [14 cmH20-22 cmH20] 17 cmH20 INTAKE / OUTPUT: Intake/Output     01/20 0701 - 01/21 0700 01/21 0701 - 01/22 0700   I.V. (mL/kg) 1794.7 (22.5)    Blood     IV Piggyback 1020    Total Intake(mL/kg) 2814.7 (35.3)    Urine (mL/kg/hr) 2565 (1.3)    Stool     Total Output 2565     Net +249.7            PHYSICAL EXAMINATION: General: no distress Neuro: sedated, RASS -2 HEENT: ETT in place Cardiovascular:  Regular, 2/6 systolic click Lungs:  scattered rhonchi Abdomen:  Soft, non tender Musculoskeletal: no edema Skin: no rashes  LABS:  CBC  Recent Labs Lab 02/05/13 1625 02/06/13 0300 02/07/13 0420  WBC 30.0* 15.8* 10.9*  HGB 11.7* 9.9* 9.2*  HCT 33.5* 28.5* 26.0*  PLT 369 272 280   Coag's  Recent Labs Lab 02/04/13 1955 02/05/13 1210 02/06/13 0300  INR 2.05* 1.75* 2.21*   BMET  Recent Labs Lab 02/05/13 1625 02/06/13 0300 02/07/13 0420  NA 141 142 134*  K 3.7 3.6* 3.9  CL 106 107 99  CO2 19 21 20   BUN 55* 52* 34*  CREATININE 1.09 1.05 1.20*  GLUCOSE 203* 183* 356*   Electrolytes  Recent Labs Lab 02/05/13 1625 02/06/13 0300 02/07/13 0420  CALCIUM 8.2* 7.9* 7.9*  MG 1.9  --   --   PHOS 4.3  --   --    Sepsis Markers  Recent Labs Lab 02/04/13 2044 02/05/13 0535 02/05/13 1543 02/05/13 1625 02/06/13 0300 02/07/13 0420  LATICACIDVEN 3.50* 1.8 3.3*  --   --   --   PROCALCITON  --   --   --  0.38 1.70 1.02   Glucose  Recent Labs Lab 02/06/13 0828 02/06/13 1120 02/06/13 1540 02/06/13 2008 02/07/13 0006 02/07/13 0416  GLUCAP 186* 192* 296* 313* 376*  346*    Imaging Dg Chest Port 1 View  02/07/2013   CLINICAL DATA:  Congestive heart failure.  EXAM: PORTABLE CHEST - 1 VIEW  COMPARISON:  02/05/2013.  FINDINGS: Endotracheal tube and right IJ line in stable position. Prior CABG. Mild cardiomegaly. Persistent bilateral pulmonary alveolar infiltrates are noted. These half improved slightly suggesting improving congestive heart failure and/or pneumonia. No pleural effusion or pneumothorax. No acute osseous abnormality.  IMPRESSION: 1. Stable line and tube positions. 2. Improving bilateral pulmonary infiltrates suggesting improving congestive heart failure and pulmonary edema. 3. Stable mild cardiomegaly.  Prior CABG.   Electronically Signed   By: Maisie Fus  Register   On: 02/07/2013 07:39   Dg Chest Port 1 View  02/05/2013   CLINICAL DATA:  Right IJ line placement.  EXAM: PORTABLE CHEST - 1  VIEW  COMPARISON:  Chest radiograph performed earlier today at 4:33 p.m.  FINDINGS: The patient's endotracheal tube is seen ending approximately 2 cm above the carina. A a right IJ line is noted ending about the mid to distal SVC.  The lungs are relatively well expanded. Vascular congestion is seen, improved from the prior study. Mild right-sided opacities may reflect pneumonia or possibly mild residual edema. A small left pleural effusion suspected. No pneumothorax is seen.  The cardiomediastinal silhouette is borderline normal in size. The patient is status post median sternotomy, with evidence of prior CABG. No acute osseous abnormalities are seen.  IMPRESSION: 1. Right IJ line seen ending about the mid to distal SVC. 2. No evidence of pneumothorax. 3. Vascular congestion has improved from the prior study; mild right-sided airspace opacities may reflect pneumonia or possibly mild residual edema. Small left pleural effusion suspected.   Electronically Signed   By: Roanna Raider M.D.   On: 02/05/2013 22:58   Dg Chest Port 1 View  02/05/2013   CLINICAL DATA:  Respiratory overload  EXAM: PORTABLE CHEST - 1 VIEW  COMPARISON:  01/16/2013  FINDINGS: Endotracheal tube in good position.  Severe bilateral airspace disease most likely edema however pneumonia not excluded. Prior CABG.  IMPRESSION: Extensive diffuse bilateral airspace disease, most likely edema.   Electronically Signed   By: Marlan Palau M.D.   On: 02/05/2013 16:53    ASSESSMENT / PLAN: GASTROINTESTINAL A:   Acute GI bleed 2nd to gastric ulcer. P:   -protonix BID -advance diet per GI  PULMONARY A:  Acute respiratory failure 2nd to acute pulmonary edema. History of COPD. P:   -pressure support wean as tolerated >> hopefully extubate soon -f/u CXR -prn BD's  CARDIOVASCULAR A:  Hypotension after intubation 1/19 >> likely from sedation. NSTEMI from demand ischemia 1/19. Hx of HTN, CAD s/p MI March 2014, s/p [employer] Aortic valve on  chronic coumadin, chronic systolic,diastolic  CHF. P:  -wean off pressors to keep MAP > 65 -hold lasix, losartan, lopressor for now -f/u Echo -hold plavix, given acute bleed -hold coumadin until GI bleed stabilized  RENAL A:   CKD - CR = 1.36, around baseline P:   -monitor renal fx, urine outpt, electrolytes  HEMATOLOGIC A:   Acute blood loss anemia - s/p 2 U pRBC's 1/18 Anticoagulation - on coumadin for mechanical heart valve P:  -f/u CBC, INR -SCD's for DVT ppx  INFECTIOUS A:   ?R knee infection - reportedly R knee was erythematous on the day prior to admission.  Started Bactrim at [facility] on 1/17. Tracheobronchitis. P:   -change Abx to levaquin 1/21  ENDOCRINE A:   Diabetes  Mellitus type 2 P:   -SSI -increase lantus to 15 units on 1/21  NEUROLOGIC A:  Acute encephalopathy 2nd to anemia, hypotension P:   -try to limit sedation while weaning vent  CC time 35 minutes.  Coralyn Helling, MD [facility] Pulmonary/Critical Care 02/07/2013, 7:42 AM Pager:  [phone] After 3pm call: [phone]

## 2013-02-07 NOTE — Progress Notes (Signed)
Subjective:  Extubated. Daughter at bedside. Agitated. Family thinks it is cymbalta withdrawal.   Fever overnight - Levaquin.   Norepi . From 8.   Objective:  Vital Signs in the last 24 hours: Temp:  [97.7 F (36.5 C)-102.6 F (39.2 C)] 102.4 F (39.1 C) (01/21 0745) Pulse Rate:  [53-157] 116 (01/21 0814) Resp:  [13-26] 24 (01/21 0814) BP: (67-140)/(34-103) 130/47 mmHg (01/21 0814) SpO2:  [94 %-100 %] 100 % (01/21 0814) FiO2 (%):  [40 %] 40 % (01/21 0800) Weight:  [175 lb 11.3 oz (79.7 kg)] 175 lb 11.3 oz (79.7 kg) (01/21 0500)  Intake/Output from previous day: 01/20 0701 - 01/21 0700 In: 2814.7 [I.V.:1794.7; IV Piggyback:1020] Out: 2565 [Urine:2565]  . antiseptic oral rinse  15 mL Mouth Rinse QID  . chlorhexidine  15 mL Mouth/Throat BID  . DULoxetine  60 mg Oral Q1200  . insulin aspart  0-15 Units Subcutaneous Q4H  . insulin glargine  15 Units Subcutaneous Daily  . levofloxacin (LEVAQUIN) IV  750 mg Intravenous Q48H  . pantoprazole (PROTONIX) IV  40 mg Intravenous Q12H  . sodium chloride  250 mL Intravenous Once  . sucralfate  1 g Oral Q6H   Physical Exam: General: Agitated, coughing at times.  Head:  Normocephalic and atraumatic.ETT Lungs: Mild rhonchi, vent noise across lung fields.  Heart: Normal S1 and metallic S2.  No murmur, rubs or gallops.  Abdomen: soft, non-tender, positive bowel sounds.Mildly distended.  Extremities: No clubbing or cyanosis. No edema. S/p R TKA Neurologic: Alert and oriented x 3.    Lab Results:  Recent Labs  02/06/13 0300 02/07/13 0420  WBC 15.8* 10.9*  HGB 9.9* 9.2*  PLT 272 280    Recent Labs  02/06/13 0300 02/07/13 0420  NA 142 134*  K 3.6* 3.9  CL 107 99  CO2 21 20  GLUCOSE 183* 356*  BUN 52* 34*  CREATININE 1.05 1.20*    Recent Labs  02/06/13 0020 02/06/13 0743  TROPONINI >20.00* >20.00*   Hepatic FuImaging: Dg Chest Port 1 View  02/07/2013   CLINICAL DATA:  Congestive heart failure.  EXAM:  PORTABLE CHEST - 1 VIEW  COMPARISON:  02/05/2013.  FINDINGS: Endotracheal tube and right IJ line in stable position. Prior CABG. Mild cardiomegaly. Persistent bilateral pulmonary alveolar infiltrates are noted. These half improved slightly suggesting improving congestive heart failure and/or pneumonia. No pleural effusion or pneumothorax. No acute osseous abnormality.  IMPRESSION: 1. Stable line and tube positions. 2. Improving bilateral pulmonary infiltrates suggesting improving congestive heart failure and pulmonary edema. 3. Stable mild cardiomegaly.  Prior CABG.   Electronically Signed   By: Maisie Fus  Register   On: 02/07/2013 07:39   Dg Chest Port 1 View  02/05/2013   CLINICAL DATA:  Right IJ line placement.  EXAM: PORTABLE CHEST - 1 VIEW  COMPARISON:  Chest radiograph performed earlier today at 4:33 p.m.  FINDINGS: The patient's endotracheal tube is seen ending approximately 2 cm above the carina. A a right IJ line is noted ending about the mid to distal SVC.  The lungs are relatively well expanded. Vascular congestion is seen, improved from the prior study. Mild right-sided opacities may reflect pneumonia or possibly mild residual edema. A small left pleural effusion suspected. No pneumothorax is seen.  The cardiomediastinal silhouette is borderline normal in size. The patient is status post median sternotomy, with evidence of prior CABG. No acute osseous abnormalities are seen.  IMPRESSION: 1. Right IJ line seen ending about the  mid to distal SVC. 2. No evidence of pneumothorax. 3. Vascular congestion has improved from the prior study; mild right-sided airspace opacities may reflect pneumonia or possibly mild residual edema. Small left pleural effusion suspected.   Electronically Signed   By: Roanna Raider M.D.   On: 02/05/2013 22:58   Dg Chest Port 1 View  02/05/2013   CLINICAL DATA:  Respiratory overload  EXAM: PORTABLE CHEST - 1 VIEW  COMPARISON:  01/16/2013  FINDINGS: Endotracheal tube in good  position.  Severe bilateral airspace disease most likely edema however pneumonia not excluded. Prior CABG.  IMPRESSION: Extensive diffuse bilateral airspace disease, most likely edema.   Electronically Signed   By: Marlan Palau M.D.   On: 02/05/2013 16:53   Personally viewed.   Telemetry: No adverse rhythms.  Personally viewed.   EKG:  NSR, LVH, ST depression inferior/lateral leads. Fairly similar to priors.   Cardiac Studies:  ECHO: 02/06/13   - Left ventricle: The cavity size was normal. There was moderate concentric hypertrophy. Systolic function was moderately to severely reduced. The estimated ejection fraction was in the range of 30% to 35%. There is severe hypokinesis of the mid-distalanteroseptal myocardium. Doppler parameters are consistent with restrictive physiology, indicative of decreased left ventricular diastolic compliance and/or increased left atrial pressure. - Aortic valve: There was mild stenosis. Valve area: 0.78cm^2(VTI). Valve area: 0.82cm^2 (Vmax). - Mitral valve: Calcified annulus. Mildly thickened leaflets . The findings are consistent with trivial stenosis. Mild regurgitation. Valve area by continuity equation (using LVOT flow): 0.96cm^2. - Left atrium: The atrium was moderately dilated. - Right atrium: The atrium was mildly dilated.  No significant change from prior ECHO 3/14.  Assessment/Plan:   73 year old with GI bleed, shock, mechanical AV, CAD with prior protected Left Main stent, chronic systolic heart failure, ischemic cardiomyopathy now with NSTEMI (Trop >20).   1) NSTEMI - a result of shock, GIB, severe anemia with underlying severe CAD. Can not use heparin obviously with recent bleeding episode. Reviewed Dr. Regino Schultze note and she is OK with restarting Coumadin and Plavix. Will continue to avoid ASA. I am in agreement for restarting Plavix and coumadin but will defer to CCM given there primary lead in her care. Needs to be able to swallow  effectively first. EF unchanged. 35%.  Had prior MI in 3/14.   Once she is over this episode and hopefully stable, consider angiogram to assess coronary anatomy (prior LM stent).   2) Mechanical AV - agree with Dr. Gala Romney. Less likely to form thombus if anticoagulation is discontinued. Hopefully we can restart coumadin soon without heparin bridge.   3) Cardiomyopathy - ECHO with EF 35%. Prior EF 35%. Obviously holding lasix and losartan in setting of recent shock. No signs of volume overload currently.   4) History of cardiac arrest (ischemic torsades de pointes) and received a drug-eluting stent to a protected leftmain coronary artery).  5) GIB - PPI, per GI.   6) Agitation - daughter said this happened before with Cymbalta withdrawal. Took about 48 hours after restarting to improve.   7) Shock - Norepi decreased to . Try to discontinue as tolerated.   Discussed with daughter at length. Understands the gravity of the situation. NSTEMI obviously increases overall mortality in this situation.   Complex patient. Time spend with patient, family, review of records and labs.   SKAINS, MARK 02/07/2013, 9:15 AM

## 2013-02-07 NOTE — Progress Notes (Signed)
Subjective  No further bleeding   Objective  Gastric ulcer with red spot on EGD 02/05/2013,  Stable hemodynamically.  May restart Coumadin if necessary, she does not have to be reendoscoped before restarting Coumadin due to high risk for conscious sedation. Vital signs in last 24 hours: Temp:  [98.1 F (36.7 C)-102.4 F (39.1 C)] 98.1 F (36.7 C) (01/21 1516) Pulse Rate:  [67-116] 73 (01/21 2045) Resp:  [13-31] 23 (01/21 2045) BP: (76-139)/(36-95) 115/37 mmHg (01/21 2045) SpO2:  [90 %-100 %] 100 % (01/21 2045) FiO2 (%):  [40 %] 40 % (01/21 0800) Weight:  [175 lb 11.3 oz (79.7 kg)] 175 lb 11.3 oz (79.7 kg) (01/21 0500)   General:    Restless, jerking movements Heart:  Regular rate and rhythm; no murmurs Lungs: Respirations even and unlabored, lungs CTA bilaterally Abdomen:  Soft, nontender and nondistended. Normal bowel sounds. Extremities:  Without edema. Neurologic:  ,  grossly normal neurologically.agitated Psych:  Agitated,  Intake/Output from previous day: 01/20 0701 - 01/21 0700 In: 2814.7 [I.V.:1794.7; IV Piggyback:1020] Out: 2565 [Urine:2565] Intake/Output this shift: Total I/O In: 74.7 [I.V.:74.7] Out: 175 [Urine:175]  Lab Results:  Recent Labs  02/05/13 1625 02/06/13 0300 02/07/13 0420  WBC 30.0* 15.8* 10.9*  HGB 11.7* 9.9* 9.2*  HCT 33.5* 28.5* 26.0*  PLT 369 272 280   BMET  Recent Labs  02/05/13 1625 02/06/13 0300 02/07/13 0420  NA 141 142 134*  K 3.7 3.6* 3.9  CL 106 107 99  CO2 19 21 20   GLUCOSE 203* 183* 356*  BUN 55* 52* 34*  CREATININE 1.09 1.05 1.20*  CALCIUM 8.2* 7.9* 7.9*   LFT No results found for this basename: PROT, ALBUMIN, AST, ALT, ALKPHOS, BILITOT, BILIDIR, IBILI,  in the last 72 hours PT/INR  Recent Labs  02/05/13 1210 02/06/13 0300  LABPROT 19.9* 23.8*  INR 1.75* 2.21*    Studies/Results: Dg Chest Port 1 View  02/07/2013   CLINICAL DATA:  Congestive heart failure.  EXAM: PORTABLE CHEST - 1 VIEW  COMPARISON:   02/05/2013.  FINDINGS: Endotracheal tube and right IJ line in stable position. Prior CABG. Mild cardiomegaly. Persistent bilateral pulmonary alveolar infiltrates are noted. These half improved slightly suggesting improving congestive heart failure and/or pneumonia. No pleural effusion or pneumothorax. No acute osseous abnormality.  IMPRESSION: 1. Stable line and tube positions. 2. Improving bilateral pulmonary infiltrates suggesting improving congestive heart failure and pulmonary edema. 3. Stable mild cardiomegaly.  Prior CABG.   Electronically Signed   By: 02/07/2013  Register   On: 02/07/2013 07:39   Dg Chest Port 1 View  02/05/2013   CLINICAL DATA:  Right IJ line placement.  EXAM: PORTABLE CHEST - 1 VIEW  COMPARISON:  Chest radiograph performed earlier today at 4:33 p.m.  FINDINGS: The patient's endotracheal tube is seen ending approximately 2 cm above the carina. A a right IJ line is noted ending about the mid to distal SVC.  The lungs are relatively well expanded. Vascular congestion is seen, improved from the prior study. Mild right-sided opacities may reflect pneumonia or possibly mild residual edema. A small left pleural effusion suspected. No pneumothorax is seen.  The cardiomediastinal silhouette is borderline normal in size. The patient is status post median sternotomy, with evidence of prior CABG. No acute osseous abnormalities are seen.  IMPRESSION: 1. Right IJ line seen ending about the mid to distal SVC. 2. No evidence of pneumothorax. 3. Vascular congestion has improved from the prior study; mild right-sided airspace opacities may reflect  pneumonia or possibly mild residual edema. Small left pleural effusion suspected.   Electronically Signed   By: Roanna Raider M.D.   On: 02/05/2013 22:58       Assessment / Plan:   S/p UGIB from small gastric ulcer, biopsies pending, Hgb slightly down, but no melena, OK to restart anticoag if necessary from cardiology standpoint, continue PPI, will not plan  to re-endoscope due to high risk of  Sedation. Please call if further help needed.  Active Problems:   Acute GI bleeding   Acute respiratory failure with hypoxia   Encephalopathy acute     LOS: 3 days   Lina Sar  02/07/2013, 9:33 PM

## 2013-02-07 NOTE — Progress Notes (Signed)
Wasted IV fent from bag, witnessed by second RN, Edison Pace, and rinsed down the sink.

## 2013-02-07 NOTE — Progress Notes (Signed)
Chaplain followed up with pt and pt's family, offering additional emotional and spiritual support. Pt appeared agitated or in pain, refused to make eye contact or speak with chaplain. When chaplain attempted to ask pt questions directly, pt said, "Go away." Pt's family laughed and explained that pt is experiencing withdrawal symptoms from her medication and has not been herself today, "saying things she normally wouldn't say." Family said they were fine and had no immediate needs, and asked chaplain to return in a few days. Will follow up.   Maurene Capes [phone] General: [phone]

## 2013-02-07 NOTE — Progress Notes (Signed)
73yo female admitted for respiratory failure, now to change IV ABX to Levaquin.  Will start Levaquin 750mg  IV Q48H for CrCl ~40 ml/min and monitor CBC and Cx.  , PharmD, BCPS 02/07/2013 7:51 AM

## 2013-02-07 NOTE — Progress Notes (Signed)
    S:  CTSP 2/2 complaint of chest pain.  Hx reviewed.  Admitted with GIB and severe ABL anemia s/p 6U prbc's.  H/H slowly drifting down since transfusions.  Pt apparently w/d from Cymbalta and has been agitated req sedation.  Also c/o diffuse pain for which she's been treated with prn fentanyl.  In this setting, she has had ongoing hypotn and had been on norepi, which was weaned off today.  Pressures currently in mid-80's.  Plavix and coumadin currently on hold in setting of GIB, though GI ok with restarting.  This afternoon, she reported c/p to dtr, who informed RN.  Pain lasted just a few secs.  Upon my arrival, pt is resting quietly with her eyes closed.  She is sitting up in bed.  Upon arrousal, she asks for help but won't clarify any more than that.  She responds that she does have c/p and that it is worse with deep breathing.  She then nods back off.  O:   Filed Vitals:   02/07/13 1445  BP: 86/39  Pulse: 75  Temp:   Resp: 22   Pleasant, nad, somnolent (on sedation), but arouses and is able to answer simple questions.  Difficult to assess orientation.  Neck: difficult to assess JVP 2/2 line in place on right.  Lungs, diminished @ bases bilat, cor: rrr, abd, diff tender, bs+x4, ext w/o CCE.  ECG: rsr, 76, ant/inflat ST dep/twi - sl more pronounced than on ECG's from this admission, similar to older ecg's. CVP: 16.  A/P:  1.  Chest pain/CAD/NSTEMI:  Pt with h/o CABG/redo-CABG and LM stenting in 03/2012.  She had + trops this admission in setting of GIB/hypotn.  EF 30-35% w/ sev mid-distalanteroseptal HK - slightly worse than noted on prior echo in 03/2012.  She has been medically managed thus far given co-morbidities/GIB/anemia.  She c/o chest pain today, that sounds to have somewhat of a pleuritic component to it.  ECG - sl more pronounced st/t changes c/w earlier ecg's, though similar to older ecg's.  Continue conservative mgmt.  Cannot institute antianginals 2/2 ongoing hypotension.  Dr.  Anne Fu has d/c'd norepi, which was only @ 1 mcg in an effort to improve coronary perfusion.  Resume plavix when ok with CCM.  Not currently on bb 2/2 hypotn.  Follow.  2.  ICM/Chronic Systolic CHF:  Wt down since admission but up 4.9 L.  Dtr concerned that pts arms appear swollen.  No other edema.  CVP elevated @ 16.  With ongoing hypotn and w/o obvious volume overload, would not be aggressive with diuresis.  D/W Dr. Anne Fu and will give one dose of IV lasix @ 20mg .  No bb/arb/acei 2/2 hypotn.  , NP

## 2013-02-07 NOTE — Progress Notes (Signed)
Cards MD paged Dr Anne Fu, pt reporting mild chest pain. No EKG changes. Md didn't want any labs drawn or EKG done, Md wanted Levo, which was on turned off, and then if BP stable and SBP>95 once off levo, given 20mg  IV lasix. Will cont to monitor pt.

## 2013-02-07 NOTE — Progress Notes (Signed)
Md paged in regards to pt agitated, precedex drip steadily increased throughout morning and PRN Fent had been given, unable to keep patient calm and pt yelling she hurts all over. One time order given for iv fent, and md wanted PO neurontin given. Will cont to assess and monitor pt.

## 2013-02-07 NOTE — Progress Notes (Signed)
Inpatient Diabetes Program Recommendations  AACE/ADA: New Consensus Statement on Inpatient Glycemic Control  Target Ranges:  Prepandial:   less than 140 mg/dL      Peak postprandial:   less than 180 mg/dL (1-2 hours)      Critically ill patients:  140 - 180 mg/dL  Pager:  [phone] Hours:  8 am-10pm   Reason for Visit: Elevated glucose: Results for AREN, CHERNE (MRN [id number]) as of 02/07/2013 09:40  Ref. Range 02/06/2013 15:40 02/06/2013 20:08 02/07/2013 00:06 02/07/2013 04:16 02/07/2013 07:43  Glucose-Capillary Latest Range: 70-99 mg/dL 712 (H) 458 (H) 099 (H) 346 (H) 331 (H)     Inpatient Diabetes Program Recommendations Insulin - Basal: Increase Lantus towards home dose of 60 units daily  Alfredia Client PhD, RN, BC-ADM Diabetes Coordinator  Office:  [phone] Team Pager:  [phone]

## 2013-02-07 NOTE — Procedures (Signed)
Extubation Procedure Note  Patient Details:   Name: [name] DOB: 05-Oct-1940 MRN: [id number]   Pt extubated to 4L Marty per MD. VS stable, pt able to vocalize, no stridor noted. Pt tolerating well at this time. RT to monitor.    Evaluation  O2 sats: stable throughout Complications: No apparent complications Patient did tolerate procedure well. Bilateral Breath Sounds: Coarse crackles Suctioning: Airway Yes  Harley Hallmark 02/07/2013, 8:16 AM

## 2013-02-08 ENCOUNTER — Inpatient Hospital Stay (HOSPITAL_COMMUNITY): Payer: Medicare Other

## 2013-02-08 ENCOUNTER — Encounter (HOSPITAL_COMMUNITY): Payer: Self-pay | Admitting: *Deleted

## 2013-02-08 DIAGNOSIS — B9689 Other specified bacterial agents as the cause of diseases classified elsewhere: Secondary | ICD-10-CM

## 2013-02-08 DIAGNOSIS — J40 Bronchitis, not specified as acute or chronic: Secondary | ICD-10-CM

## 2013-02-08 LAB — GLUCOSE, CAPILLARY
GLUCOSE-CAPILLARY: 131 mg/dL — AB (ref 70–99)
GLUCOSE-CAPILLARY: 122 mg/dL — AB (ref 70–99)
GLUCOSE-CAPILLARY: 104 mg/dL — AB (ref 70–99)
Glucose-Capillary: 128 mg/dL — ABNORMAL HIGH (ref 70–99)
Glucose-Capillary: 144 mg/dL — ABNORMAL HIGH (ref 70–99)
Glucose-Capillary: 138 mg/dL — ABNORMAL HIGH (ref 70–99)

## 2013-02-08 LAB — BASIC METABOLIC PANEL
BUN: 35 mg/dL — AB (ref 6–23)
CO2: 19 mEq/L (ref 19–32)
Calcium: 7.7 mg/dL — ABNORMAL LOW (ref 8.4–10.5)
Chloride: 95 mEq/L — ABNORMAL LOW (ref 96–112)
Creatinine, Ser: 1.3 mg/dL — ABNORMAL HIGH (ref 0.50–1.10)
GFR calc Af Amer: 46 mL/min — ABNORMAL LOW (ref >90)
GFR, EST NON AFRICAN AMERICAN: 40 mL/min — AB (ref >90)
Glucose, Bld: 165 mg/dL — ABNORMAL HIGH (ref 70–99)
Potassium: 4.1 mEq/L (ref 3.7–5.3)
SODIUM: 131 meq/L — AB (ref 137–147)

## 2013-02-08 LAB — CBC
HCT: 26.1 % — ABNORMAL LOW (ref 36.0–46.0)
Hemoglobin: 8.8 g/dL — ABNORMAL LOW (ref 12.0–15.0)
MCH: 30.8 pg (ref 26.0–34.0)
MCHC: 33.7 g/dL (ref 30.0–36.0)
MCV: 91.3 fL (ref 78.0–100.0)
PLATELETS: 242 10*3/uL (ref 150–400)
RBC: 2.86 MIL/uL — ABNORMAL LOW (ref 3.87–5.11)
RDW: 16.1 % — ABNORMAL HIGH (ref 11.5–15.5)
WBC: 12.4 10*3/uL — ABNORMAL HIGH (ref 4.0–10.5)

## 2013-02-08 LAB — MAGNESIUM: Magnesium: 1.8 mg/dL (ref 1.5–2.5)

## 2013-02-08 MED ORDER — NOREPINEPHRINE BITARTRATE 1 MG/ML IJ SOLN
2.0000 ug/min | INTRAVENOUS | Status: DC
  Filled 2013-02-08: qty 4

## 2013-02-08 MED ORDER — PANTOPRAZOLE SODIUM 40 MG PO TBEC
40.0000 mg | DELAYED_RELEASE_TABLET | Freq: Two times a day (BID) | ORAL | Status: DC
  Administered 2013-02-08 – 2013-02-09 (×2): 40 mg via ORAL
  Filled 2013-02-08 (×2): qty 1

## 2013-02-08 NOTE — Progress Notes (Signed)
[facility]   Name: [name] MRN: [id number] DOB: Nov 04, 1940    ADMISSION DATE:  02/04/2013  REFERRING MD :  EDP  CHIEF COMPLAINT:  GI bleed  BRIEF PATIENT DESCRIPTION: 73 yo woman, history of mechanical heart valve on coumadin, prior upper GI bleed 2012, presents with a 1-day history of black, tarry stools, which progressed to BRBPR, admitted for acute GI bleed.  SIGNIFICANT EVENTS:  1/18 - admitted for acute GI bleed, transfused 2 units on admission 1/19 - GI, Cardiology; EGD; Respiratory distress >> VDRF 1/20 - added precedex for sedation 1/21 - Fever 102.6, increased respiratory secretions  STUDIES: 1/19 - EGD >> gastric ulcer 1/20 - 2D echo>>> EF 30-35%, severe hypokinesis, mild MR, mod dilated LA  LINES / TUBES: ETT 1/19 >> 1/21 Rt IJ CVL 1/19 >>   CULTURES: Sputum Cx 1/20 >> moraxella (B lactam POS)  ANTIBIOTICS: Bactrim >> 1/21 Levaquin 1/21 >>   SUBJECTIVE:  Extubated yesterday.  Remains on low dose levophed, precedex.  New Afib overnight. Easily agitated. Afebrile.   VITAL SIGNS: Temp:  [97.7 F (36.5 C)-99.8 F (37.7 C)] 98.5 F (36.9 C) (01/22 0858) Pulse Rate:  [48-91] 55 (01/22 0900) Resp:  [17-32] 26 (01/22 0900) BP: (76-149)/(33-95) 143/33 mmHg (01/22 0900) SpO2:  [93 %-100 %] 96 % (01/22 0900) Weight:  [181 lb 4.8 oz (82.237 kg)] 181 lb 4.8 oz (82.237 kg) (01/22 0630) On RA  INTAKE / OUTPUT: Intake/Output     01/21 0701 - 01/22 0700 01/22 0701 - 01/23 0700   P.O. 1000    I.V. (mL/kg) 1674.8 (20.4) 53.8 (0.7)   IV Piggyback 150    Total Intake(mL/kg) 2824.8 (34.3) 53.8 (0.7)   Urine (mL/kg/hr) 1130 (0.6) 75 (0.4)   Total Output 1130 75   Net +1694.8 -21.2          PHYSICAL EXAMINATION: General: no distress Neuro: awake, oriented, yells out at times, easily agitated HEENT: mm dry, no JVD  Cardiovascular:  irregular, 2/6 systolic click Lungs: resps even non labored on RA, scattered rhonchi Abdomen:  Soft, non  tender Musculoskeletal: no edema Skin: no rashes  LABS:  CBC  Recent Labs Lab 02/06/13 0300 02/07/13 0420 02/08/13 0404  WBC 15.8* 10.9* 12.4*  HGB 9.9* 9.2* 8.8*  HCT 28.5* 26.0* 26.1*  PLT 272 280 242   Coag's  Recent Labs Lab 02/04/13 1955 02/05/13 1210 02/06/13 0300  INR 2.05* 1.75* 2.21*   BMET  Recent Labs Lab 02/06/13 0300 02/07/13 0420 02/08/13 0404  NA 142 134* 131*  K 3.6* 3.9 4.1  CL 107 99 95*  CO2 21 20 19   BUN 52* 34* 35*  CREATININE 1.05 1.20* 1.30*  GLUCOSE 183* 356* 165*   Electrolytes  Recent Labs Lab 02/05/13 1625 02/06/13 0300 02/07/13 0420 02/08/13 0404  CALCIUM 8.2* 7.9* 7.9* 7.7*  MG 1.9  --   --  1.8  PHOS 4.3  --   --   --    Sepsis Markers  Recent Labs Lab 02/04/13 2044 02/05/13 0535 02/05/13 1543 02/05/13 1625 02/06/13 0300 02/07/13 0420  LATICACIDVEN 3.50* 1.8 3.3*  --   --   --   PROCALCITON  --   --   --  0.38 1.70 1.02   Glucose  Recent Labs Lab 02/07/13 0743 02/07/13 1207 02/07/13 1519 02/07/13 1942 02/08/13 0012 02/08/13 0334  GLUCAP 331* 160* 110* 134* 128* 144*    Imaging Dg Chest Port 1 View  02/08/2013   CLINICAL DATA:  Follow-up  CHF  EXAM: PORTABLE CHEST - 1 VIEW  COMPARISON:  02/07/2013  FINDINGS: Endotracheal tube has been removed. Central line tip in the SVC. No pneumothorax  Improved lung aeration bilaterally with decrease in bilateral airspace disease suggesting clearing edema. Mild bibasilar atelectasis remains.  IMPRESSION: Continued improvement in bilateral airspace disease consistent with clearing edema.   Electronically Signed   By: Marlan Palauharles  Clark M.D.   On: 02/08/2013 08:11   Dg Chest Port 1 View  02/07/2013   CLINICAL DATA:  Congestive heart failure.  EXAM: PORTABLE CHEST - 1 VIEW  COMPARISON:  02/05/2013.  FINDINGS: Endotracheal tube and right IJ line in stable position. Prior CABG. Mild cardiomegaly. Persistent bilateral pulmonary alveolar infiltrates are noted. These half improved  slightly suggesting improving congestive heart failure and/or pneumonia. No pleural effusion or pneumothorax. No acute osseous abnormality.  IMPRESSION: 1. Stable line and tube positions. 2. Improving bilateral pulmonary infiltrates suggesting improving congestive heart failure and pulmonary edema. 3. Stable mild cardiomegaly.  Prior CABG.   Electronically Signed   By: Maisie Fushomas  Register   On: 02/07/2013 07:39    ASSESSMENT / PLAN: GASTROINTESTINAL A:   Acute GI bleed 2nd to gastric ulcer.  P:   -protonix BID -advance diet per GI  PULMONARY A:  Acute respiratory failure 2nd to acute pulmonary edema. History of COPD. Tracheobronchitis - moraxella in sputum  P:   -pulm hygiene  -f/u CXR -prn BD's -abx as below   CARDIOVASCULAR A:  Hypotension  NSTEMI -- from demand ischemia 1/19. 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  New Afib 1/22 (hx "irregular heart rhythm") P:  -wean off pressors - change MAP goal 55 to 60 -- avoid alpha constriction with intermittent chest pain, new AFib -continue to hold losartan, lopressor for now -low dose lasix x 1 on 1/22 per cardiology  -okay to resume anti-coagulation per GI -d/w Dr. Anne FuSkains   RENAL A:   CKD - CR = 1.36, around baseline Hyponatremia  P:   -monitor renal fx, urine outpt, electrolytes  HEMATOLOGIC A:   Acute blood loss anemia - s/p 2 U pRBC's 1/18 Anticoagulation - on chronic coumadin for mechanical heart valve P:  -f/u CBC, INR -SCD's for DVT ppx  INFECTIOUS A:   ?R knee infection - reportedly R knee was erythematous on the day prior to admission.  [facility]. Tracheobronchitis. P:   -change Abx to levaquin 1/21 (q48 dosed for renal function)  ENDOCRINE A:   Diabetes Mellitus type 2 P:   -SSI -continue lantus 15 units >> may need to increase as diet  advanced  NEUROLOGIC A:  Acute encephalopathy 2nd to anemia, hypotension P:   -continue precedex for now, continue attempts to wean off -cymbalta, neurontin restarted 1/21 -PT consult   Updated daughter at bedside.  She is concerned about dispo -- pt was in SNF when GI bleed began and daughter had to call EMS herself.  She does not want pt to go back to SNF if at all possible.  She would prefer CIR v LTAC v home with home health.   Danford BadWHITEHEART,KATHRYN, NP 02/08/2013  9:37 AM Pager: [phone] or [phone]  Reviewed above, examined pt, and agree with assessment/plan.  Weaned off levophed >> accept MAP 55 to 60.  Will keep CVL for now.  PT to assess.  Will try to  d/c foley.  Wean off precedex as tolerated.  Will need to assess for LTAC placement as she progresses.  Coralyn Helling, MD [facility] Pulmonary/Critical Care 02/08/2013, 10:58 AM Pager:  [phone] After 3pm call: [phone]

## 2013-02-08 NOTE — Progress Notes (Addendum)
Called Elink MD Vassie Loll to update on pt status, pt has a mild increase in work of breathing since beginning of shift (7pm), O2 sats remain 96-100% on room air. CVP increased 14-19, and UOP 30-54mL/hr. Also made aware of frequent PACs/a-fib onset. New orders received to stop continuous fluids and draw morning labs early. Will continue to monitor.  0530am MD Vassie Loll updated that pt still in and out of a-fib rate controlled 70s-90s, will check Mag level. No new interventions at this time. Will continue to monitor.

## 2013-02-08 NOTE — Progress Notes (Signed)
Subjective:  Extubated. Daughter at bedside. Less agitation today. Not complaining of any chest pain. Yesterday afternoon had a bout of chest discomfort. Resolved. Fever overnight    Norepi now off   Objective:  Vital Signs in the last 24 hours: Temp:  [97.7 F (36.5 C)-99.8 F (37.7 C)] 97.7 F (36.5 C) (01/22 1145) Pulse Rate:  [48-84] 74 (01/22 1100) Resp:  [17-32] 20 (01/22 1100) BP: (76-149)/(33-95) 108/37 mmHg (01/22 1100) SpO2:  [93 %-100 %] 97 % (01/22 1100) Weight:  [181 lb 4.8 oz (82.237 kg)] 181 lb 4.8 oz (82.237 kg) (01/22 0630)  Intake/Output from previous day: 01/21 0701 - 01/22 0700 In: 2824.8 [P.O.:1000; I.V.:1674.8; IV Piggyback:150] Out: 1130 [Urine:1130]  . antiseptic oral rinse  15 mL Mouth Rinse QID  . chlorhexidine  15 mL Mouth/Throat BID  . DULoxetine  60 mg Oral Q1200  . insulin aspart  0-15 Units Subcutaneous Q4H  . insulin glargine  15 Units Subcutaneous Daily  . levofloxacin (LEVAQUIN) IV  750 mg Intravenous Q48H  . pantoprazole  40 mg Oral BID AC  . sodium chloride  250 mL Intravenous Once  . sucralfate  1 g Oral Q6H   Physical Exam: General: Elderly, less agitated, eyes shut. Head:  Normocephalic and atraumatic.ETT Lungs: Mild rhonchi, no significant wheezing, normal-appearing respiratory effort.  Heart: Normal S1 and metallic S2.  No murmur, rubs or gallops.  Abdomen: soft, non-tender, positive bowel sounds.Mildly distended.  Extremities: No clubbing or cyanosis. No edema. S/p R TKA Neurologic:  no distress, moves all extremities    Lab Results:  Recent Labs  02/07/13 0420 02/08/13 0404  WBC 10.9* 12.4*  HGB 9.2* 8.8*  PLT 280 242    Recent Labs  02/07/13 0420 02/08/13 0404  NA 134* 131*  K 3.9 4.1  CL 99 95*  CO2 20 19  GLUCOSE 356* 165*  BUN 34* 35*  CREATININE 1.20* 1.30*    Recent Labs  02/06/13 0020 02/06/13 0743  TROPONINI >20.00* >20.00*   Hepatic FuImaging: Dg Chest Port 1 View  02/08/2013    CLINICAL DATA:  Follow-up CHF  EXAM: PORTABLE CHEST - 1 VIEW  COMPARISON:  02/07/2013  FINDINGS: Endotracheal tube has been removed. Central line tip in the SVC. No pneumothorax  Improved lung aeration bilaterally with decrease in bilateral airspace disease suggesting clearing edema. Mild bibasilar atelectasis remains.  IMPRESSION: Continued improvement in bilateral airspace disease consistent with clearing edema.   Electronically Signed   By: Marlan Palau M.D.   On: 02/08/2013 08:11   Dg Chest Port 1 View  02/07/2013   CLINICAL DATA:  Congestive heart failure.  EXAM: PORTABLE CHEST - 1 VIEW  COMPARISON:  02/05/2013.  FINDINGS: Endotracheal tube and right IJ line in stable position. Prior CABG. Mild cardiomegaly. Persistent bilateral pulmonary alveolar infiltrates are noted. These half improved slightly suggesting improving congestive heart failure and/or pneumonia. No pleural effusion or pneumothorax. No acute osseous abnormality.  IMPRESSION: 1. Stable line and tube positions. 2. Improving bilateral pulmonary infiltrates suggesting improving congestive heart failure and pulmonary edema. 3. Stable mild cardiomegaly.  Prior CABG.   Electronically Signed   By: Maisie Fus  Register   On: 02/07/2013 07:39   Personally viewed.   Telemetry: No adverse rhythms.  Personally viewed.   EKG:  NSR, LVH, ST depression inferior/lateral leads. Fairly similar to priors.   Cardiac Studies:  ECHO: 02/06/13   - Left ventricle: The cavity size was normal. There was moderate concentric hypertrophy. Systolic  function was moderately to severely reduced. The estimated ejection fraction was in the range of 30% to 35%. There is severe hypokinesis of the mid-distalanteroseptal myocardium. Doppler parameters are consistent with restrictive physiology, indicative of decreased left ventricular diastolic compliance and/or increased left atrial pressure. - Aortic valve: There was mild stenosis. Valve area: 0.78cm^2(VTI). Valve  area: 0.82cm^2 (Vmax). - Mitral valve: Calcified annulus. Mildly thickened leaflets . The findings are consistent with trivial stenosis. Mild regurgitation. Valve area by continuity equation (using LVOT flow): 0.96cm^2. - Left atrium: The atrium was moderately dilated. - Right atrium: The atrium was mildly dilated.  No significant change from prior ECHO 3/14.  Assessment/Plan:   73 year old with GI bleed, shock, mechanical AV, CAD with prior protected Left Main stent, chronic systolic heart failure, ischemic cardiomyopathy now with NSTEMI (Trop >20).   1) NSTEMI - a result of shock, GIB, severe anemia with underlying severe CAD. Can not use heparin obviously with recent bleeding episode. Reviewed Dr. Regino Schultze note and she is OK with restarting Coumadin and Plavix. Will continue to avoid ASA. I am in agreement for restarting Plavix and coumadin but will defer to CCM given there primary lead in her care. Needs to be able to swallow effectively first. EF unchanged. 35%.  Had prior MI in 3/14.   Once she is over this episode and hopefully stable, consider angiogram to assess coronary anatomy (prior LM stent).   If blood pressure is able to tolerate, could consider low-dose isosorbide such as 15 mg or 30 mg for anti-anginal support.  2) Mechanical AV - agree with Dr. Gala Romney. Less likely to form thombus if anticoagulation is discontinued. Hopefully we can restart coumadin soon without heparin bridge.   3) Cardiomyopathy - ECHO with EF 35%. Prior EF 35%. Receive one dose of Lasix IV 20 mg. No signs of volume overload currently. Watch fluids closely.  4) History of cardiac arrest (ischemic torsades de pointes) and received a drug-eluting stent to a protected leftmain coronary artery).  5) GIB - PPI, per GI.   6) Agitation - daughter said this happened before with Cymbalta withdrawal. Took about 48 hours after restarting to improve.   7) Shock - Norepi now off. Let us try to avoid giving  vasoconstrictive properties on coronary arteries.  8) premature atrial contractions-I reviewed telemetry, no evidence of atrial fibrillation. PAC.  Discussed with daughter at length. Understands the gravity of the situation. NSTEMI obviously increases overall mortality in this situation.   Complex patient. Time spend with patient, family, review of records and labs. Discussed case with APP.   Gautam Langhorst 02/08/2013, 11:46 AM

## 2013-02-08 NOTE — Progress Notes (Signed)
UR Completed.  Deaunna Olarte Jane [phone] 02/08/2013  

## 2013-02-09 ENCOUNTER — Inpatient Hospital Stay
Admission: AD | Admit: 2013-02-09 | Discharge: 2013-03-18 | Disposition: E | Payer: Medicare Other | Source: Ambulatory Visit | Attending: Internal Medicine | Admitting: Internal Medicine

## 2013-02-09 ENCOUNTER — Inpatient Hospital Stay (HOSPITAL_COMMUNITY): Payer: Medicare Other

## 2013-02-09 DIAGNOSIS — I5043 Acute on chronic combined systolic (congestive) and diastolic (congestive) heart failure: Secondary | ICD-10-CM

## 2013-02-09 DIAGNOSIS — I2510 Atherosclerotic heart disease of native coronary artery without angina pectoris: Secondary | ICD-10-CM

## 2013-02-09 DIAGNOSIS — K279 Peptic ulcer, site unspecified, unspecified as acute or chronic, without hemorrhage or perforation: Secondary | ICD-10-CM

## 2013-02-09 DIAGNOSIS — B9689 Other specified bacterial agents as the cause of diseases classified elsewhere: Secondary | ICD-10-CM

## 2013-02-09 DIAGNOSIS — Z952 Presence of prosthetic heart valve: Secondary | ICD-10-CM

## 2013-02-09 DIAGNOSIS — I255 Ischemic cardiomyopathy: Secondary | ICD-10-CM

## 2013-02-09 DIAGNOSIS — Z7901 Long term (current) use of anticoagulants: Secondary | ICD-10-CM

## 2013-02-09 DIAGNOSIS — E1159 Type 2 diabetes mellitus with other circulatory complications: Secondary | ICD-10-CM | POA: Diagnosis present

## 2013-02-09 DIAGNOSIS — J40 Bronchitis, not specified as acute or chronic: Secondary | ICD-10-CM

## 2013-02-09 DIAGNOSIS — K922 Gastrointestinal hemorrhage, unspecified: Secondary | ICD-10-CM | POA: Diagnosis present

## 2013-02-09 HISTORY — DX: Gastrointestinal hemorrhage, unspecified: K92.2

## 2013-02-09 HISTORY — DX: Chronic systolic (congestive) heart failure: I50.22

## 2013-02-09 HISTORY — DX: Type 2 diabetes mellitus without complications: E11.9

## 2013-02-09 HISTORY — DX: Presence of prosthetic heart valve: Z95.2

## 2013-02-09 LAB — GLUCOSE, CAPILLARY
GLUCOSE-CAPILLARY: 160 mg/dL — AB (ref 70–99)
GLUCOSE-CAPILLARY: 240 mg/dL — AB (ref 70–99)
Glucose-Capillary: 157 mg/dL — ABNORMAL HIGH (ref 70–99)
Glucose-Capillary: 120 mg/dL — ABNORMAL HIGH (ref 70–99)
Glucose-Capillary: 158 mg/dL — ABNORMAL HIGH (ref 70–99)
Glucose-Capillary: 177 mg/dL — ABNORMAL HIGH (ref 70–99)

## 2013-02-09 LAB — BASIC METABOLIC PANEL
BUN: 29 mg/dL — AB (ref 6–23)
CHLORIDE: 99 meq/L (ref 96–112)
CO2: 21 meq/L (ref 19–32)
CREATININE: 0.98 mg/dL (ref 0.50–1.10)
Calcium: 7.8 mg/dL — ABNORMAL LOW (ref 8.4–10.5)
GFR calc Af Amer: 65 mL/min — ABNORMAL LOW (ref >90)
GFR calc non Af Amer: 56 mL/min — ABNORMAL LOW (ref >90)
GLUCOSE: 175 mg/dL — AB (ref 70–99)
Potassium: 3.7 mEq/L (ref 3.7–5.3)
Sodium: 134 mEq/L — ABNORMAL LOW (ref 137–147)

## 2013-02-09 LAB — CBC
HEMATOCRIT: 27.3 % — AB (ref 36.0–46.0)
Hemoglobin: 9.2 g/dL — ABNORMAL LOW (ref 12.0–15.0)
MCH: 30.4 pg (ref 26.0–34.0)
MCHC: 33.7 g/dL (ref 30.0–36.0)
MCV: 90.1 fL (ref 78.0–100.0)
Platelets: 314 10*3/uL (ref 150–400)
RBC: 3.03 MIL/uL — AB (ref 3.87–5.11)
RDW: 15.9 % — ABNORMAL HIGH (ref 11.5–15.5)
WBC: 18 10*3/uL — ABNORMAL HIGH (ref 4.0–10.5)

## 2013-02-09 LAB — PROTIME-INR
INR: 1.9 — ABNORMAL HIGH (ref 0.00–1.49)
Prothrombin Time: 21.2 seconds — ABNORMAL HIGH (ref 11.6–15.2)

## 2013-02-09 MED ORDER — ACETAMINOPHEN 325 MG PO TABS: 650.0000 mg | ORAL_TABLET | Freq: Four times a day (QID) | ORAL | Status: DC | PRN

## 2013-02-09 MED ORDER — ONDANSETRON HCL 4 MG/2ML IJ SOLN
4.0000 mg | Freq: Four times a day (QID) | INTRAMUSCULAR | Status: DC | PRN
  Administered 2013-02-09: 4 mg via INTRAVENOUS
  Filled 2013-02-09: qty 2

## 2013-02-09 MED ORDER — SODIUM CHLORIDE 0.9 % IJ SOLN: 10.0000 mL | Freq: Two times a day (BID) | INTRAMUSCULAR | Status: DC

## 2013-02-09 MED ORDER — WARFARIN SODIUM 1 MG PO TABS
1.0000 mg | ORAL_TABLET | Freq: Once | ORAL | Status: DC
  Filled 2013-02-09: qty 1

## 2013-02-09 MED ORDER — WARFARIN - PHARMACIST DOSING INPATIENT: Freq: Every day | Status: DC

## 2013-02-09 MED ORDER — CLOPIDOGREL BISULFATE 75 MG PO TABS
75.0000 mg | ORAL_TABLET | Freq: Every day | ORAL | Status: DC
  Filled 2013-02-09 (×3): qty 1

## 2013-02-09 MED ORDER — INSULIN GLARGINE 100 UNIT/ML ~~LOC~~ SOLN
20.0000 [IU] | Freq: Every day | SUBCUTANEOUS | Status: DC
  Administered 2013-02-09: 20 [IU] via SUBCUTANEOUS
  Filled 2013-02-09: qty 0.2

## 2013-02-09 MED ORDER — METOPROLOL TARTRATE 12.5 MG HALF TABLET
12.5000 mg | ORAL_TABLET | Freq: Two times a day (BID) | ORAL | Status: DC
Start: 2013-02-09 — End: 2013-02-09
  Filled 2013-02-09 (×2): qty 1

## 2013-02-09 MED ORDER — SODIUM CHLORIDE 0.9 % IJ SOLN: 10.0000 mL | INTRAMUSCULAR | Status: DC | PRN

## 2013-02-09 MED ORDER — LEVOFLOXACIN 750 MG PO TABS: 750.0000 mg | ORAL_TABLET | ORAL | Status: DC

## 2013-02-09 MED ORDER — INSULIN ASPART 100 UNIT/ML ~~LOC~~ SOLN
0.0000 [IU] | Freq: Three times a day (TID) | SUBCUTANEOUS | Status: DC
  Administered 2013-02-09: 3 [IU] via SUBCUTANEOUS

## 2013-02-09 MED ORDER — FUROSEMIDE 40 MG PO TABS
40.0000 mg | ORAL_TABLET | Freq: Every day | ORAL | Status: DC
  Administered 2013-02-09: 40 mg via ORAL
  Filled 2013-02-09: qty 1

## 2013-02-09 NOTE — Discharge Summary (Signed)
Physician Discharge Summary  Patient ID: [name] MRN: [id number] DOB/AGE: [birth year] 73 y.o.  Admit date: 02/04/2013 Discharge date: 02/17/2013  Problem List Active Problems:   Acute GI bleeding   Acute respiratory failure with hypoxia   Encephalopathy acute   Moraxella catarrhalis bronchitis  HPI: The patient is a 73 yo woman, history of St Jude valve on chronic coumadin, prior UGI bleed, HTN, COPD, prior MI 03/2012 still on plavix, DM, CHF (EF 35-40%, 03/2012), presenting with GI bleed. Around 3:30 AM on the day of admission, the patient started having bowel movements, initially with dark, tarry stools, which progressed to BRBPR with dark clots, with about 7 BM's over the course of the day. The patient notes associated generalized weakness, and mild SOB. The patient is s/p R knee replacement 01/23/13, currently taking tylenol, tramadol, and hydrocodone for pain. She denies NSAID usage, alcohol usage, or tobacco use. She notes no recent heartburn, abdominal pain, nausea, vomiting, diarrhea, constipation, chest pain. Of note, the patient was also found to be hyperglycemic on admission, with CBG's in the upper 400's, and there is some debate between the patient and family regarding the details of her insulin regimen. No blurry vision, polyuria, polydipsia.     Hospital Course: SIGNIFICANT EVENTS:  1/18 - admitted for acute GI bleed, transfused 2 units on admission  1/19 - GI, Cardiology; EGD; Respiratory distress >> VDRF  1/20 - added precedex for sedation  1/21 - Fever 102.6, increased respiratory secretions  1/22 - off pressors  1/23 - off precedex  STUDIES:  1/19 - EGD >> gastric ulcer  1/20 - 2D echo>>> EF 30-35%, severe hypokinesis, mild MR, mod dilated LA  LINES / TUBES:  ETT 1/19 >> 1/21  Rt IJ CVL 1/19 >>  CULTURES:  Sputum Cx 1/20 >> moraxella (B lactam POS)  ANTIBIOTICS:  Bactrim >> 1/21  Levaquin 1/21 >>   ASSESSMENT / PLAN:  GASTROINTESTINAL  A:  Acute GI bleed  2nd to gastric ulcer.  P:  -protonix BID  -advance diet as tolerated  -prn zofran for nausea  PULMONARY  A:  Acute respiratory failure 2nd to acute pulmonary edema.  History of COPD.  Tracheobronchitis - moraxella in sputum  P:  -pulm hygiene  -f/u CXR as needed  -prn BD's  CARDIOVASCULAR  A:  Hypotension -- resolved 1/22.  NSTEMI -- from demand ischemia 1/19.  Hx of HTN, CAD s/p MI March 2014, s/p St Jude Aortic valve on chronic coumadin, chronic systolic,diastolic CHF -- EF essentially unchanged on new echo (EF 30-35%).  New Afib 1/22 (hx "irregular heart rhythm")  P:  -holding losartan, lopressor for now >> defer to cardiology to resume  -okay to resume anti-coagulation per GI >> [facility] with cardiology  -Cardiology will follow on [facility]. RENAL  Lab Results  Component Value Date   CREATININE 0.98 01/22/2013   CREATININE 1.30* 02/08/2013   CREATININE 1.20* 02/07/2013   CREATININE 1.22* 06/15/2012    A:  Acute kidney injury 2nd to hypotension -- resolved.  CKD - CR = 1.36, around baseline  Hyponatremia -- improved.  P:  -monitor renal fx, urine outpt, electrolytes  HEMATOLOGIC  A:  Acute blood loss anemia - s/p 2 U pRBC's 1/18  Anticoagulation - on chronic coumadin for mechanical heart valve  P:  -f/u CBC, INR  -SCD's for DVT ppx  INFECTIOUS  A:  ?R knee infection - reportedly R knee was erythematous on the day prior to admission. Started [facility]actrim at [facility] on 1/17.  Tracheobronchitis 2nd to Moraxella.  P:  -change Abx to levaquin 1/21 (q48 dosed for renal function) >> will change to po on 1/23  ENDOCRINE  A:  Diabetes Mellitus type 2  P:  -SSI  -increase lantus 20 units on 1/23 >> may need to increase as diet advanced  NEUROLOGIC  A:  Acute encephalopathy 2nd to anemia, hypotension >> much improved.  P:  -cymbalta, neurontin restarted 1/21  -PT consult       Labs at discharge Lab Results  Component Value Date   CREATININE 0.98 02/17/2013   BUN 29* 2013-02-17    NA 134* 02/17/13   K 3.7 February 17, 2013   CL 99 02/17/13   CO2 21 17-Feb-2013   Lab Results  Component Value Date   WBC 18.0* 2013/02/17   HGB 9.2* 02-17-2013   HCT 27.3* 02-17-13   MCV 90.1 2013-02-17   PLT 314 2013-02-17   Lab Results  Component Value Date   ALT 12 01/16/2013   AST 23 01/16/2013   ALKPHOS 45 01/16/2013   BILITOT 0.7 01/16/2013   Lab Results  Component Value Date   INR 2.21* 02/06/2013   INR 1.75* 02/05/2013   INR 2.05* 02/04/2013    Current radiology studies Dg Chest Port 1 View  February 17, 2013   CLINICAL DATA:  Respiratory failure  EXAM: PORTABLE CHEST - 1 VIEW  COMPARISON:  February 08, 2013  FINDINGS: Central catheter tip is in the superior vena cava near the cavoatrial junction. No pneumothorax.  There is underlying emphysema. There is no edema or consolidation. Heart is enlarged with normal pulmonary vascularity. No adenopathy. Patient is status post coronary artery bypass grafting.  IMPRESSION: Underlying emphysema. No edema or consolidation. Stable cardiomegaly. Central catheter tip in superior vena cava. No pneumothorax.   Electronically Signed   By: Bretta Bang M.D.   On: 17-Feb-2013 07:33   Dg Chest Port 1 View  02/08/2013   CLINICAL DATA:  Follow-up CHF  EXAM: PORTABLE CHEST - 1 VIEW  COMPARISON:  02/07/2013  FINDINGS: Endotracheal tube has been removed. Central line tip in the SVC. No pneumothorax  Improved lung aeration bilaterally with decrease in bilateral airspace disease suggesting clearing edema. Mild bibasilar atelectasis remains.  IMPRESSION: Continued improvement in bilateral airspace disease consistent with clearing edema.   Electronically Signed   By: Marlan Palau M.D.   On: 02/08/2013 08:11    Disposition:  03-Skilled Nursing Facility   Future Appointments Provider Department Dept Phone   03/28/2013 10:00 AM York Spaniel, MD [facility] [phone]       Medication List    ASK your doctor about these medications        atorvastatin 40 MG tablet  Commonly known as:  LIPITOR  Take 40 mg by mouth at bedtime. 9pm     calcium carbonate 600 MG Tabs tablet  Commonly known as:  OS-CAL  Take 600 mg by mouth 2 (two) times daily.     clopidogrel 75 MG tablet  Commonly known as:  PLAVIX  Take 1 tablet (75 mg total) by mouth daily with breakfast.     DSS 100 MG Caps  Take 100 mg by mouth 2 (two) times daily.     DULoxetine 60 MG capsule  Commonly known as:  CYMBALTA  Take 60 mg by mouth daily at 12 noon.     folic acid 400 MCG tablet  Commonly known as:  FOLVITE  Take 400 mcg by mouth every evening. 5pm  furosemide 40 MG tablet  Commonly known as:  LASIX  Take 1 tablet (40 mg total) by mouth 2 (two) times daily.     gabapentin 300 MG capsule  Commonly known as:  NEURONTIN  Take 300 mg by mouth 3 (three) times daily.     HYDROcodone-acetaminophen 10-325 MG per tablet  Commonly known as:  NORCO  Take 1 tablet by mouth every 6 (six) hours.     insulin glargine 100 UNIT/ML injection  Commonly known as:  LANTUS  Inject 60 Units into the skin daily with breakfast.     insulin lispro 100 UNIT/ML injection  Commonly known as:  HUMALOG  Inject 7 Units into the skin 3 (three) times daily with meals.     losartan 100 MG tablet  Commonly known as:  COZAAR  Take 100 mg by mouth daily.     metoprolol succinate 25 MG 24 hr tablet  Commonly known as:  TOPROL-XL  Take 1 tablet (25 mg total) by mouth 2 (two) times daily.     multivitamin with minerals Tabs tablet  Take 1 tablet by mouth daily.     nitroGLYCERIN 0.4 MG SL tablet  Commonly known as:  NITROSTAT  Place 0.4 mg under the tongue every 5 (five) minutes as needed for chest pain.     polyethylene glycol powder powder  Commonly known as:  GLYCOLAX/MIRALAX  Take 17 g by mouth daily.     potassium chloride SA 20 MEQ tablet  Commonly known as:  K-DUR,KLOR-CON  Take 20 mEq by mouth 3 (three) times a week. Monday, Wednesday, Friday - at  9am     saccharomyces boulardii 250 MG capsule  Commonly known as:  FLORASTOR  Take 250 mg by mouth 2 (two) times daily. Take for 2 weeks starting 02/02/13     sulfamethoxazole-trimethoprim 800-160 MG per tablet  Commonly known as:  BACTRIM DS  Take 1 tablet by mouth every 12 (twelve) hours. 7 day course started 02/03/13 (for cellulitis)     vitamin B-12 1000 MCG tablet  Commonly known as:  CYANOCOBALAMIN  Take 1,000 mcg by mouth 2 (two) times daily.     warfarin 2 MG tablet  Commonly known as:  COUMADIN  Take 2 mg by mouth at bedtime. 9pm          Discharged Condition: fair  Time spent on discharge greater than 40 minutes.  Vital signs at Discharge. Temp:  [98.4 F (36.9 C)-99.9 F (37.7 C)] 99 F (37.2 C) (01/23 0734) Pulse Rate:  [80-109] 107 (01/23 0600) Resp:  [0-24] 17 (01/23 0600) BP: (99-144)/(38-59) 128/44 mmHg (01/23 0600) SpO2:  [96 %-100 %] 99 % (01/23 0600) Weight:  [179 lb 14.3 oz (81.6 kg)] 179 lb 14.3 oz (81.6 kg) (01/23 0600) Office follow up Special Information or instructions. Per [facility] and cardiology. Signed: Brett Canales Minor ACNP [facility] PCCM Pager [phone] till 3 pm If no answer page [phone] 02-20-13, 12:02 PM     Coralyn Helling, MD [facility] Pulmonary/Critical Care Feb 20, 2013, 12:20 PM Pager:  [phone] After 3pm call: [phone]

## 2013-02-09 NOTE — Progress Notes (Signed)
ANTICOAGULATION CONSULT NOTE - Initial Consult  Pharmacy Consult for Coumadin Indication: St. Jude Mechanical Aortic Valve   Allergies  Allergen Reactions  . Shellfish Allergy Anaphylaxis    Not allergic to betadine  . Codeine Other (See Comments)    Feels sick    Patient Measurements: Height: 5\' 3"  (160 cm) Weight: 179 lb 14.3 oz (81.6 kg) IBW/kg (Calculated) : 52.4  Vital Signs: Temp: 98.6 F (37 C) (01/23 1228) Temp src: Oral (01/23 1228) BP: 128/44 mmHg (01/23 0600) Pulse Rate: 107 (01/23 0600)  Labs:  Recent Labs  02/07/13 0420 02/08/13 0404 01/27/2013 0445  HGB 9.2* 8.8* 9.2*  HCT 26.0* 26.1* 27.3*  PLT 280 242 314  CREATININE 1.20* 1.30* 0.98    Estimated Creatinine Clearance: 52.5 ml/min (by C-G formula based on Cr of 0.98).   Medical History: Past Medical History  Diagnosis Date  . High blood pressure   . Irregular heart rhythm   . High cholesterol   . Coronary artery disease   . COPD (chronic obstructive pulmonary disease)   . Anemia   . Irregular heart beat   . Myocardial infarction march 2013  . Peripheral vascular disease   . Cardiac arrest   . CHF (congestive heart failure) 01/06/12    Transthoracic Echocardiogram EF 45-50%  . Memory deficits 09/06/2012  . Polyneuropathy in diabetes(357.2)   . Degenerative arthritis   . Fibromyalgia   . Diabetes mellitus     Type II  . Heart murmur   . Angina at rest     no new symptoms since 11/08/12 (01/17/13)  . DM type 2 (diabetes mellitus, type 2)     Assessment: 72 YOF on chronic coumadin for hx of mechanical aortic valve. Pt. Was admitted on 1/18 with UGIB 2/2 gastric ulcer. INR 2.05 on admission, coumadin has been on hold. GI ok'd to resume coumadin. Most recent INR 2.21 on 1/20. hgb 9.2, plt 214, stable. Cardiology note (1/23) mentioned goal is 2-2.5 since recent GIB, INR 1.9 today, though no coumadin since admission, currently on clear liquid diet, also on D#3 Levaquin, and bactrim 1/20-1/21    Goal of Therapy:  INR 2-2.5 Monitor platelets by anticoagulation protocol: Yes   Plan:  - Coumadin 1mg  po x 1 - f/u daily INR.   08-06-1978, PharmD, BCPS  Clinical Pharmacist  Pager: [phone]   02/15/2013,12:55 PM

## 2013-02-09 NOTE — Progress Notes (Signed)
Physical Therapy Evaluation Patient Details Name: [name] MRN: [id number] DOB: Jul 17, 1940 Today's Date: 02/07/2013 Time: 1245-1310 PT Time Calculation (min): 25 min  PT Assessment / Plan / Recommendation History of Present Illness  Pt is a 73 y/o female admitted  With 1 day h/o GIB s/p R TKA.  Clinical Impression  Pt admitted with tarry stools and weakness. Pt currently with functional limitations due to the deficits listed below (see PT Problem List).  Pt will benefit from skilled PT to increase their independence and safety with mobility to allow discharge to the venue listed below.       PT Assessment  Patient needs continued PT services    Follow Up Recommendations  SNF    Does the patient have the potential to tolerate intense rehabilitation      Barriers to Discharge        Equipment Recommendations  None recommended by PT    Recommendations for Other Services     Frequency Min 3X/week    Precautions / Restrictions Precautions Precautions: Knee;Fall Restrictions Weight Bearing Restrictions: Yes RLE Weight Bearing: Weight bearing as tolerated   Pertinent Vitals/Pain       Mobility  Bed Mobility Overal bed mobility: Needs Assistance Bed Mobility: Supine to Sit Supine to sit: Min assist General bed mobility comments: cues for guidance and sequencing Transfers Overall transfer level: Needs assistance Equipment used: Rolling walker (2 wheeled) Transfers: Sit to/from [employer] Sit to Stand: Min assist;Mod assist (depending on surface) Stand pivot transfers: Min assist General transfer comment: cues for hand placement and technique. cues for ant weight shift to achieve standing and to control descent. second person for safety, however not needed to assist physically today. Ambulation/Gait Ambulation/Gait assistance: Mod assist Ambulation Distance (Feet): 15 Feet Assistive device: Rolling walker (2 wheeled) Gait Pattern/deviations:  Step-to pattern;Decreased stance time - right;Decreased stride length;Antalgic Gait velocity: Decreased    Exercises     PT Diagnosis: Difficulty walking;Acute pain;Generalized weakness  PT Problem List: Decreased strength;Decreased range of motion;Decreased mobility;Decreased activity tolerance;Decreased knowledge of use of DME;Decreased knowledge of precautions;Cardiopulmonary status limiting activity;Pain PT Treatment Interventions: Gait training;DME instruction;Functional mobility training;Therapeutic activities;Patient/family education     PT Goals(Current goals can be found in the care plan section) Acute Rehab PT Goals Patient Stated Goal: To return home with husband after rehab Time For Goal Achievement: 02/23/13 Potential to Achieve Goals: Good  Visit Information  Last PT Received On: 02/08/2013 Assistance Needed: +2 History of Present Illness: Pt is a 73 y/o female admitted s/p R TKA.       Prior Functioning  Home Living Family/patient expects to be discharged to:: Skilled nursing facility Living Arrangements: Spouse/significant other Prior Function Level of Independence: Independent Communication Communication: No difficulties Dominant Hand: Left    Cognition  Cognition Arousal/Alertness: Lethargic Overall Cognitive Status: Impaired/Different from baseline Area of Impairment: Safety/judgement;Problem solving;Attention Current Attention Level: Sustained Following Commands: Follows one step commands consistently    Extremity/Trunk Assessment Upper Extremity Assessment Upper Extremity Assessment: Defer to OT evaluation Lower Extremity Assessment Lower Extremity Assessment: Generalized weakness RLE Deficits / Details: Decreased strength and AROM consistent with TKA   Balance Balance Overall balance assessment: No apparent balance deficits (not formally assessed)  End of Session PT - End of Session Equipment Utilized During Treatment: Gait belt Activity  Tolerance: Patient limited by pain Patient left: in chair;with call bell/phone within reach Nurse Communication: Mobility status  GP     Rayshaun Needle, Eliseo Gum 02/16/2013, 4:41 PM 01/26/2013  Kennard Bing, PT [phone]  (pager)

## 2013-02-09 NOTE — Progress Notes (Signed)
Subjective:  Comfortable in bed. No complaints of CP. Considering LTAC   Objective:  Vital Signs in the last 24 hours: Temp:  [98.4 F (36.9 C)-99.9 F (37.7 C)] 99 F (37.2 C) (01/23 0734) Pulse Rate:  [73-109] 107 (01/23 0600) Resp:  [0-24] 17 (01/23 0600) BP: (99-144)/(38-59) 128/44 mmHg (01/23 0600) SpO2:  [96 %-100 %] 99 % (01/23 0600) Weight:  [179 lb 14.3 oz (81.6 kg)] 179 lb 14.3 oz (81.6 kg) (01/23 0600)  Intake/Output from previous day: 01/22 0701 - 01/23 0700 In: 317.2 [I.V.:77.2; IV Piggyback:240] Out: 165 [Urine:165]  . antiseptic oral rinse  15 mL Mouth Rinse QID  . chlorhexidine  15 mL Mouth/Throat BID  . DULoxetine  60 mg Oral Q1200  . insulin aspart  0-15 Units Subcutaneous TID WC  . insulin glargine  20 Units Subcutaneous Daily  . [START ON 02/11/2013] levofloxacin  750 mg Oral Q48H  . pantoprazole  40 mg Oral BID AC  . sodium chloride  250 mL Intravenous Once  . sucralfate  1 g Oral Q6H   Physical Exam: General: Elderly,calm. Head:  Normocephalic and atraumatic.ETT Lungs: Mild rhonchi, no significant wheezing, normal-appearing respiratory effort.  Heart: Normal S1 and metallic S2.  No murmur, rubs or gallops. Rare ectopy Abdomen: soft, non-tender, positive bowel sounds.Mildly distended.  Extremities: No clubbing or cyanosis. No edema. S/p R TKA Neurologic:  no distress, moves all extremities    Lab Results:  Recent Labs  02/08/13 0404 01/21/2013 0445  WBC 12.4* 18.0*  HGB 8.8* 9.2*  PLT 242 314    Recent Labs  02/08/13 0404 02/15/2013 0445  NA 131* 134*  K 4.1 3.7  CL 95* 99  CO2 19 21  GLUCOSE 165* 175*  BUN 35* 29*  CREATININE 1.30* 0.98    Dg Chest Port 1 View  01/21/2013   CLINICAL DATA:  Respiratory failure  EXAM: PORTABLE CHEST - 1 VIEW  COMPARISON:  February 08, 2013  FINDINGS: Central catheter tip is in the superior vena cava near the cavoatrial junction. No pneumothorax.  There is underlying emphysema. There is no edema  or consolidation. Heart is enlarged with normal pulmonary vascularity. No adenopathy. Patient is status post coronary artery bypass grafting.  IMPRESSION: Underlying emphysema. No edema or consolidation. Stable cardiomegaly. Central catheter tip in superior vena cava. No pneumothorax.   Electronically Signed   By: Bretta Bang M.D.   On: 02/01/2013 07:33   Dg Chest Port 1 View  02/08/2013   CLINICAL DATA:  Follow-up CHF  EXAM: PORTABLE CHEST - 1 VIEW  COMPARISON:  02/07/2013  FINDINGS: Endotracheal tube has been removed. Central line tip in the SVC. No pneumothorax  Improved lung aeration bilaterally with decrease in bilateral airspace disease suggesting clearing edema. Mild bibasilar atelectasis remains.  IMPRESSION: Continued improvement in bilateral airspace disease consistent with clearing edema.   Electronically Signed   By: Marlan Palau M.D.   On: 02/08/2013 08:11   Personally viewed.   Telemetry: No adverse rhythms. NO AFIB. PAC's.  Personally viewed.   EKG:  NSR, LVH, ST depression inferior/lateral leads. Fairly similar to priors.   Cardiac Studies:  ECHO: 02/06/13   - Left ventricle: The cavity size was normal. There was moderate concentric hypertrophy. Systolic function was moderately to severely reduced. The estimated ejection fraction was in the range of 30% to 35%. There is severe hypokinesis of the mid-distalanteroseptal myocardium. Doppler parameters are consistent with restrictive physiology, indicative of decreased left ventricular diastolic compliance  and/or increased left atrial pressure. - Aortic valve: There was mild stenosis. Valve area: 0.78cm^2(VTI). Valve area: 0.82cm^2 (Vmax). - Mitral valve: Calcified annulus. Mildly thickened leaflets . The findings are consistent with trivial stenosis. Mild regurgitation. Valve area by continuity equation (using LVOT flow): 0.96cm^2. - Left atrium: The atrium was moderately dilated. - Right atrium: The atrium was mildly  dilated.  No significant change from prior ECHO 3/14.  Assessment/Plan:   73 year old with GI bleed, shock, mechanical AV, CAD with prior protected Left Main stent, chronic systolic heart failure, ischemic cardiomyopathy now with NSTEMI (Trop >20).   1) NSTEMI - a result of shock, GIB, severe anemia with underlying severe CAD. Reviewed Dr. Regino Schultze note and she is OK with restarting Coumadin and Plavix. Will restart. Will continue to avoid ASA.Seems OK with CCM.   EF unchanged. 35%. Has LM stent, would like Plavix.   Had prior MI in 3/14.   Will resume low dose metoprolol 12.5mg  BID. Will hold ARB for now. May be able to restart in future.   2) Mechanical AV - restarting coumadin, goal is 2-2.5 INR. OK not to bridge.    3) Cardiomyopathy - ECHO with EF 35%. Prior EF 35%.  Watch fluids closely. Was on lasix 40 BID at home. Will restart 40mg  once a day. If needed OK to increase to BID.   4) History of cardiac arrest (ischemic torsades de pointes) and received a drug-eluting stent to a protected leftmain coronary artery).  5) GIB - PPI, per GI.   6) Agitation - daughter said this happened before with Cymbalta withdrawal. Took about 48 hours after restarting to improve. Improved.   7) Shock - resolved.   8) premature atrial contractions-I reviewed telemetry, no evidence of atrial fibrillation. PAC.  Discussed with daughter previously at length. Understands the gravity of the situation. NSTEMI obviously increases overall mortality in this situation.   Complex patient. She is hopefully going forward to LTAC. No further cardiac testing.   SKAINS, MARK February 12, 2013, 11:47 AM

## 2013-02-09 NOTE — Progress Notes (Signed)
Report called to [facility].  All VSS.  Pt being transferred via wheelchair.

## 2013-02-09 NOTE — Progress Notes (Signed)
[facility]   Name: [name] MRN: [id number] DOB: [birth year]    ADMISSION DATE:  02/04/2013  REFERRING MD :  EDP  CHIEF COMPLAINT:  GI bleed  BRIEF PATIENT DESCRIPTION: 73 yo woman, history of mechanical heart valve on coumadin, prior upper GI bleed 2012, presents with a 1-day history of black, tarry stools, which progressed to [facility]PR, admitted for acute GI bleed.  SIGNIFICANT EVENTS:  1/18 - admitted for acute GI bleed, transfused 2 units on admission 1/19 - GI, Cardiology; EGD; Respiratory distress >> VDRF 1/20 - added precedex for sedation 1/21 - Fever 102.6, increased respiratory secretions 1/22 - off pressors 1/23 - off precedex  STUDIES: 1/19 - EGD >> gastric ulcer 1/20 - 2D echo>>> EF 30-35%, severe hypokinesis, mild MR, mod dilated LA  LINES / TUBES: ETT 1/19 >> 1/21 Rt IJ CVL 1/19 >>   CULTURES: Sputum Cx 1/20 >> moraxella (B lactam POS)  ANTIBIOTICS: Bactrim >> 1/21 Levaquin 1/21 >>   SUBJECTIVE:  Still having pain, but improving.  Decreased cough.  Denies chest pain, abdominal pain  VITAL SIGNS: Temp:  [97.7 F (36.5 C)-99.9 F (37.7 C)] 99 F (37.2 C) (01/23 0734) Pulse Rate:  [69-109] 107 (01/23 0600) Resp:  [0-24] 17 (01/23 0600) BP: (98-144)/(37-59) 128/44 mmHg (01/23 0600) SpO2:  [96 %-100 %] 99 % (01/23 0600) Weight:  [179 lb 14.3 oz (81.6 kg)] 179 lb 14.3 oz (81.6 kg) (01/23 0600) On RA  INTAKE / OUTPUT: Intake/Output     01/22 0701 - 01/23 0700 01/23 0701 - 01/24 0700   P.O.     I.V. (mL/kg) 77.2 (0.9)    IV Piggyback 240    Total Intake(mL/kg) 317.2 (3.9)    Urine (mL/kg/hr) 165 (0.1)    Total Output 165     Net +152.2          Urine Occurrence 8 x      PHYSICAL EXAMINATION: General: no distress, sitting in chair Neuro: awake, more alert, pleasant HEENT: Rt IJ site clean Cardiovascular:  irregular, 2/6 systolic click Lungs: resps even non labored on RA, scattered rhonchi Abdomen:  Soft, non tender Musculoskeletal: no  edema Skin: no rashes  LABS:  CBC  Recent Labs Lab 02/07/13 0420 02/08/13 0404 03/02/2013 0445  WBC 10.9* 12.4* 18.0*  HGB 9.2* 8.8* 9.2*  HCT 26.0* 26.1* 27.3*  PLT 280 242 314   Coag's  Recent Labs Lab 02/04/13 1955 02/05/13 1210 02/06/13 0300  INR 2.05* 1.75* 2.21*   BMET  Recent Labs Lab 02/07/13 0420 02/08/13 0404 02/24/2013 0445  NA 134* 131* 134*  K 3.9 4.1 3.7  CL 99 95* 99  CO2 20 19 21   BUN 34* 35* 29*  CREATININE 1.20* 1.30* 0.98  GLUCOSE 356* 165* 175*   Electrolytes  Recent Labs Lab 02/05/13 1625  02/07/13 0420 02/08/13 0404 March 05, 2013 0445  CALCIUM 8.2*  < > 7.9* 7.7* 7.8*  MG 1.9  --   --  1.8  --   PHOS 4.3  --   --   --   --   < > = values in this interval not displayed. Sepsis Markers  Recent Labs Lab 02/04/13 2044 02/05/13 0535 02/05/13 1543 02/05/13 1625 02/06/13 0300 02/07/13 0420  LATICACIDVEN 3.50* 1.8 3.3*  --   --   --   PROCALCITON  --   --   --  0.38 1.70 1.02   Glucose  Recent Labs Lab 02/08/13 1614 02/08/13 1956 02/08/13 2036 02/08/13 2329 03/04/2013 0323  01/23/2013 0733  GLUCAP 104* 138* 157* 120* 158* 160*    Imaging Dg Chest Port 1 View  01/31/2013   CLINICAL DATA:  Respiratory failure  EXAM: PORTABLE CHEST - 1 VIEW  COMPARISON:  February 08, 2013  FINDINGS: Central catheter tip is in the superior vena cava near the cavoatrial junction. No pneumothorax.  There is underlying emphysema. There is no edema or consolidation. Heart is enlarged with normal pulmonary vascularity. No adenopathy. Patient is status post coronary artery bypass grafting.  IMPRESSION: Underlying emphysema. No edema or consolidation. Stable cardiomegaly. Central catheter tip in superior vena cava. No pneumothorax.   Electronically Signed   By: Bretta Bang M.D.   On: 01/21/2013 07:33   Dg Chest Port 1 View  02/08/2013   CLINICAL DATA:  Follow-up CHF  EXAM: PORTABLE CHEST - 1 VIEW  COMPARISON:  02/07/2013  FINDINGS: Endotracheal tube has  been removed. Central line tip in the SVC. No pneumothorax  Improved lung aeration bilaterally with decrease in bilateral airspace disease suggesting clearing edema. Mild bibasilar atelectasis remains.  IMPRESSION: Continued improvement in bilateral airspace disease consistent with clearing edema.   Electronically Signed   By: Marlan Palau M.D.   On: 02/08/2013 08:11    ASSESSMENT / PLAN: GASTROINTESTINAL A:   Acute GI bleed 2nd to gastric ulcer.  P:   -protonix BID -advance diet as tolerated -prn zofran for nausea  PULMONARY A:  Acute respiratory failure 2nd to acute pulmonary edema. History of COPD. Tracheobronchitis - moraxella in sputum  P:   -pulm hygiene  -f/u CXR as needed -prn BD's  CARDIOVASCULAR A:  Hypotension -- resolved 1/22. NSTEMI -- from demand ischemia 1/19. 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  New Afib 1/22 (hx "irregular heart rhythm") P:  -holding losartan, lopressor for now >> defer to cardiology to resume -okay to resume anti-coagulation per GI >> defer to cardiology -will likely need further cardiac evaluation in hospital prior to transfer to [facility]  RENAL A:   Acute kidney injury 2nd to hypotension -- resolved. CKD - CR = 1.36, around baseline Hyponatremia -- improved. P:   -monitor renal fx, urine outpt, electrolytes  HEMATOLOGIC A:   Acute blood loss anemia - s/p 2 U pRBC's 1/18 Anticoagulation - on chronic coumadin for mechanical heart valve P:  -f/u CBC, INR -SCD's for DVT ppx  INFECTIOUS A:   ?R knee infection - reportedly R knee was erythematous on the day prior to admission.  Started Bactrim at [facility] on 1/17. Tracheobronchitis 2nd to Moraxella. P:   -change Abx to levaquin 1/21 (q48 dosed for renal function) >> will change to po on 1/23  ENDOCRINE A:   Diabetes Mellitus type 2 P:   -SSI -increase  lantus 20 units on 1/23 >> may need to increase as diet advanced  NEUROLOGIC A:  Acute encephalopathy 2nd to anemia, hypotension >> much improved. P:   -cymbalta, neurontin restarted 1/21 -PT consult   Summary: Slowly improving.  Need to determine whether she needs additional cardiac assessment while in hospital, and how to best arrange orthopedic follow up prior to transfer to LTAC.  Will transfer to SDU.  Will have [facility] assume care from 1/24 and PCCM sign off.  Coralyn Helling, MD [facility] Pulmonary/Critical Care 02/08/2013, 9:21 AM Pager:  [phone] After 3pm call: [phone]

## 2013-02-10 ENCOUNTER — Other Ambulatory Visit (HOSPITAL_COMMUNITY): Payer: Self-pay

## 2013-02-10 LAB — CBC
HEMATOCRIT: 28.2 % — AB (ref 36.0–46.0)
HEMOGLOBIN: 9.4 g/dL — AB (ref 12.0–15.0)
MCH: 29.8 pg (ref 26.0–34.0)
MCHC: 33.3 g/dL (ref 30.0–36.0)
MCV: 89.5 fL (ref 78.0–100.0)
Platelets: 333 10*3/uL (ref 150–400)
RBC: 3.15 MIL/uL — ABNORMAL LOW (ref 3.87–5.11)
RDW: 15.8 % — ABNORMAL HIGH (ref 11.5–15.5)
WBC: 17 10*3/uL — ABNORMAL HIGH (ref 4.0–10.5)

## 2013-02-10 LAB — PROCALCITONIN: Procalcitonin: 0.49 ng/mL

## 2013-02-10 LAB — COMPREHENSIVE METABOLIC PANEL
ALT: 308 U/L — ABNORMAL HIGH (ref 0–35)
AST: 285 U/L — ABNORMAL HIGH (ref 0–37)
Albumin: 2.2 g/dL — ABNORMAL LOW (ref 3.5–5.2)
Alkaline Phosphatase: 72 U/L (ref 39–117)
BUN: 19 mg/dL (ref 6–23)
CALCIUM: 7.8 mg/dL — AB (ref 8.4–10.5)
CO2: 25 mEq/L (ref 19–32)
CREATININE: 0.8 mg/dL (ref 0.50–1.10)
Chloride: 99 mEq/L (ref 96–112)
GFR calc non Af Amer: 72 mL/min — ABNORMAL LOW (ref >90)
GFR, EST AFRICAN AMERICAN: 83 mL/min — AB (ref >90)
GLUCOSE: 182 mg/dL — AB (ref 70–99)
Potassium: 3.5 mEq/L — ABNORMAL LOW (ref 3.7–5.3)
Sodium: 135 mEq/L — ABNORMAL LOW (ref 137–147)
Total Bilirubin: 1.4 mg/dL — ABNORMAL HIGH (ref 0.3–1.2)
Total Protein: 5.4 g/dL — ABNORMAL LOW (ref 6.0–8.3)

## 2013-02-10 LAB — URINALYSIS, ROUTINE W REFLEX MICROSCOPIC
GLUCOSE, UA: 500 mg/dL — AB
Hgb urine dipstick: NEGATIVE
KETONES UR: NEGATIVE mg/dL
Nitrite: NEGATIVE
PROTEIN: NEGATIVE mg/dL
Specific Gravity, Urine: 1.019 (ref 1.005–1.030)
UROBILINOGEN UA: 1 mg/dL (ref 0.0–1.0)
pH: 5.5 (ref 5.0–8.0)

## 2013-02-10 LAB — URINE MICROSCOPIC-ADD ON

## 2013-02-10 LAB — PROTIME-INR
INR: 1.57 — AB (ref 0.00–1.49)
Prothrombin Time: 18.3 seconds — ABNORMAL HIGH (ref 11.6–15.2)

## 2013-02-10 LAB — TSH: TSH: 0.58 u[IU]/mL (ref 0.350–4.500)

## 2013-02-10 LAB — PREALBUMIN: PREALBUMIN: 6.8 mg/dL — AB (ref 17.0–34.0)

## 2013-02-11 LAB — PROTIME-INR
INR: 1.37 (ref 0.00–1.49)
PROTHROMBIN TIME: 16.5 s — AB (ref 11.6–15.2)

## 2013-02-12 LAB — BASIC METABOLIC PANEL
BUN: 14 mg/dL (ref 6–23)
CHLORIDE: 93 meq/L — AB (ref 96–112)
CO2: 25 mEq/L (ref 19–32)
Calcium: 7.7 mg/dL — ABNORMAL LOW (ref 8.4–10.5)
Creatinine, Ser: 0.69 mg/dL (ref 0.50–1.10)
GFR calc Af Amer: >90 mL/min (ref >90)
GFR calc non Af Amer: 85 mL/min — ABNORMAL LOW (ref >90)
Glucose, Bld: 104 mg/dL — ABNORMAL HIGH (ref 70–99)
POTASSIUM: 2.9 meq/L — AB (ref 3.7–5.3)
Sodium: 133 mEq/L — ABNORMAL LOW (ref 137–147)

## 2013-02-12 LAB — CBC
HEMATOCRIT: 27.5 % — AB (ref 36.0–46.0)
Hemoglobin: 9 g/dL — ABNORMAL LOW (ref 12.0–15.0)
MCH: 29.9 pg (ref 26.0–34.0)
MCHC: 32.7 g/dL (ref 30.0–36.0)
MCV: 91.4 fL (ref 78.0–100.0)
PLATELETS: 369 10*3/uL (ref 150–400)
RBC: 3.01 MIL/uL — ABNORMAL LOW (ref 3.87–5.11)
RDW: 16 % — ABNORMAL HIGH (ref 11.5–15.5)
WBC: 17.8 10*3/uL — AB (ref 4.0–10.5)

## 2013-02-12 LAB — PROTIME-INR
INR: 1.29 (ref 0.00–1.49)
PROTHROMBIN TIME: 15.8 s — AB (ref 11.6–15.2)

## 2013-02-13 LAB — POTASSIUM: Potassium: 3.3 mEq/L — ABNORMAL LOW (ref 3.7–5.3)

## 2013-02-13 LAB — PROTIME-INR
INR: 1.86 — AB (ref 0.00–1.49)
PROTHROMBIN TIME: 20.9 s — AB (ref 11.6–15.2)

## 2013-02-14 LAB — CBC WITH DIFFERENTIAL/PLATELET
BASOS ABS: 0.1 10*3/uL (ref 0.0–0.1)
Basophils Relative: 1 % (ref 0–1)
Eosinophils Absolute: 0.6 10*3/uL (ref 0.0–0.7)
Eosinophils Relative: 5 % (ref 0–5)
HCT: 27.1 % — ABNORMAL LOW (ref 36.0–46.0)
Hemoglobin: 9.1 g/dL — ABNORMAL LOW (ref 12.0–15.0)
LYMPHS ABS: 2 10*3/uL (ref 0.7–4.0)
Lymphocytes Relative: 16 % (ref 12–46)
MCH: 30.6 pg (ref 26.0–34.0)
MCHC: 33.6 g/dL (ref 30.0–36.0)
MCV: 91.2 fL (ref 78.0–100.0)
Monocytes Absolute: 1.3 10*3/uL — ABNORMAL HIGH (ref 0.1–1.0)
Monocytes Relative: 10 % (ref 3–12)
NEUTROS ABS: 8.7 10*3/uL — AB (ref 1.7–7.7)
Neutrophils Relative %: 68 % (ref 43–77)
Platelets: 434 10*3/uL — ABNORMAL HIGH (ref 150–400)
RBC: 2.97 MIL/uL — ABNORMAL LOW (ref 3.87–5.11)
RDW: 16.2 % — AB (ref 11.5–15.5)
WBC: 12.7 10*3/uL — ABNORMAL HIGH (ref 4.0–10.5)

## 2013-02-14 LAB — BASIC METABOLIC PANEL
BUN: 12 mg/dL (ref 6–23)
CALCIUM: 8.2 mg/dL — AB (ref 8.4–10.5)
CO2: 24 mEq/L (ref 19–32)
Chloride: 96 mEq/L (ref 96–112)
Creatinine, Ser: 0.84 mg/dL (ref 0.50–1.10)
GFR calc Af Amer: 79 mL/min — ABNORMAL LOW (ref >90)
GFR calc non Af Amer: 68 mL/min — ABNORMAL LOW (ref >90)
GLUCOSE: 193 mg/dL — AB (ref 70–99)
Potassium: 4 mEq/L (ref 3.7–5.3)
Sodium: 134 mEq/L — ABNORMAL LOW (ref 137–147)

## 2013-02-14 LAB — PROTIME-INR
INR: 2.15 — ABNORMAL HIGH (ref 0.00–1.49)
Prothrombin Time: 23.3 seconds — ABNORMAL HIGH (ref 11.6–15.2)

## 2013-02-15 LAB — CBC
HEMATOCRIT: 27.4 % — AB (ref 36.0–46.0)
Hemoglobin: 8.8 g/dL — ABNORMAL LOW (ref 12.0–15.0)
MCH: 29.5 pg (ref 26.0–34.0)
MCHC: 32.1 g/dL (ref 30.0–36.0)
MCV: 91.9 fL (ref 78.0–100.0)
Platelets: 379 10*3/uL (ref 150–400)
RBC: 2.98 MIL/uL — ABNORMAL LOW (ref 3.87–5.11)
RDW: 15.8 % — AB (ref 11.5–15.5)
WBC: 8.3 10*3/uL (ref 4.0–10.5)

## 2013-02-15 LAB — BASIC METABOLIC PANEL
BUN: 16 mg/dL (ref 6–23)
CO2: 22 mEq/L (ref 19–32)
Calcium: 7.9 mg/dL — ABNORMAL LOW (ref 8.4–10.5)
Chloride: 90 mEq/L — ABNORMAL LOW (ref 96–112)
Creatinine, Ser: 0.83 mg/dL (ref 0.50–1.10)
GFR, EST AFRICAN AMERICAN: 80 mL/min — AB (ref >90)
GFR, EST NON AFRICAN AMERICAN: 69 mL/min — AB (ref >90)
Glucose, Bld: 357 mg/dL — ABNORMAL HIGH (ref 70–99)
Potassium: 3.8 mEq/L (ref 3.7–5.3)
SODIUM: 126 meq/L — AB (ref 137–147)

## 2013-02-15 LAB — PROTIME-INR
INR: 3.24 — ABNORMAL HIGH (ref 0.00–1.49)
Prothrombin Time: 31.9 seconds — ABNORMAL HIGH (ref 11.6–15.2)

## 2013-02-16 LAB — CBC
HEMATOCRIT: 30 % — AB (ref 36.0–46.0)
Hemoglobin: 9.9 g/dL — ABNORMAL LOW (ref 12.0–15.0)
MCH: 30.2 pg (ref 26.0–34.0)
MCHC: 33 g/dL (ref 30.0–36.0)
MCV: 91.5 fL (ref 78.0–100.0)
Platelets: 377 10*3/uL (ref 150–400)
RBC: 3.28 MIL/uL — ABNORMAL LOW (ref 3.87–5.11)
RDW: 15.8 % — AB (ref 11.5–15.5)
WBC: 8.6 10*3/uL (ref 4.0–10.5)

## 2013-02-16 LAB — PROTIME-INR
INR: 3.94 — AB (ref 0.00–1.49)
Prothrombin Time: 37 seconds — ABNORMAL HIGH (ref 11.6–15.2)

## 2013-02-17 LAB — BASIC METABOLIC PANEL
BUN: 17 mg/dL (ref 6–23)
CALCIUM: 8.6 mg/dL (ref 8.4–10.5)
CO2: 22 meq/L (ref 19–32)
Chloride: 92 mEq/L — ABNORMAL LOW (ref 96–112)
Creatinine, Ser: 0.86 mg/dL (ref 0.50–1.10)
GFR calc Af Amer: 76 mL/min — ABNORMAL LOW (ref >90)
GFR calc non Af Amer: 66 mL/min — ABNORMAL LOW (ref >90)
GLUCOSE: 166 mg/dL — AB (ref 70–99)
Potassium: 4.1 mEq/L (ref 3.7–5.3)
Sodium: 130 mEq/L — ABNORMAL LOW (ref 137–147)

## 2013-02-17 LAB — CBC
HCT: 28.6 % — ABNORMAL LOW (ref 36.0–46.0)
Hemoglobin: 9.4 g/dL — ABNORMAL LOW (ref 12.0–15.0)
MCH: 30.2 pg (ref 26.0–34.0)
MCHC: 32.9 g/dL (ref 30.0–36.0)
MCV: 92 fL (ref 78.0–100.0)
PLATELETS: 357 10*3/uL (ref 150–400)
RBC: 3.11 MIL/uL — ABNORMAL LOW (ref 3.87–5.11)
RDW: 16.1 % — AB (ref 11.5–15.5)
WBC: 6.5 10*3/uL (ref 4.0–10.5)

## 2013-02-17 LAB — PROTIME-INR
INR: 3.46 — ABNORMAL HIGH (ref 0.00–1.49)
Prothrombin Time: 33.5 seconds — ABNORMAL HIGH (ref 11.6–15.2)

## 2013-02-18 LAB — PROTIME-INR
INR: 3.28 — ABNORMAL HIGH (ref 0.00–1.49)
Prothrombin Time: 32.2 seconds — ABNORMAL HIGH (ref 11.6–15.2)

## 2013-02-18 DEATH — deceased

## 2013-02-19 LAB — COMPREHENSIVE METABOLIC PANEL
ALK PHOS: 82 U/L (ref 39–117)
ALT: 32 U/L (ref 0–35)
AST: 37 U/L (ref 0–37)
Albumin: 2.2 g/dL — ABNORMAL LOW (ref 3.5–5.2)
BUN: 12 mg/dL (ref 6–23)
CHLORIDE: 95 meq/L — AB (ref 96–112)
CO2: 26 mEq/L (ref 19–32)
CREATININE: 0.68 mg/dL (ref 0.50–1.10)
Calcium: 7.8 mg/dL — ABNORMAL LOW (ref 8.4–10.5)
GFR calc non Af Amer: 85 mL/min — ABNORMAL LOW (ref >90)
Glucose, Bld: 115 mg/dL — ABNORMAL HIGH (ref 70–99)
Potassium: 4 mEq/L (ref 3.7–5.3)
Sodium: 133 mEq/L — ABNORMAL LOW (ref 137–147)
Total Bilirubin: 1.1 mg/dL (ref 0.3–1.2)
Total Protein: 6.1 g/dL (ref 6.0–8.3)

## 2013-02-19 LAB — PREALBUMIN: Prealbumin: 9.1 mg/dL — ABNORMAL LOW (ref 17.0–34.0)

## 2013-02-19 LAB — CBC WITH DIFFERENTIAL/PLATELET
BASOS PCT: 0 % (ref 0–1)
Basophils Absolute: 0 10*3/uL (ref 0.0–0.1)
Eosinophils Absolute: 0.1 10*3/uL (ref 0.0–0.7)
Eosinophils Relative: 2 % (ref 0–5)
HEMATOCRIT: 27.6 % — AB (ref 36.0–46.0)
HEMOGLOBIN: 8.8 g/dL — AB (ref 12.0–15.0)
Lymphocytes Relative: 17 % (ref 12–46)
Lymphs Abs: 1.3 10*3/uL (ref 0.7–4.0)
MCH: 29.4 pg (ref 26.0–34.0)
MCHC: 31.9 g/dL (ref 30.0–36.0)
MCV: 92.3 fL (ref 78.0–100.0)
MONO ABS: 1.1 10*3/uL — AB (ref 0.1–1.0)
MONOS PCT: 14 % — AB (ref 3–12)
Neutro Abs: 4.9 10*3/uL (ref 1.7–7.7)
Neutrophils Relative %: 66 % (ref 43–77)
Platelets: 308 10*3/uL (ref 150–400)
RBC: 2.99 MIL/uL — ABNORMAL LOW (ref 3.87–5.11)
RDW: 16.3 % — ABNORMAL HIGH (ref 11.5–15.5)
WBC: 7.4 10*3/uL (ref 4.0–10.5)

## 2013-02-19 LAB — PROTIME-INR
INR: 2.72 — ABNORMAL HIGH (ref 0.00–1.49)
Prothrombin Time: 27.9 seconds — ABNORMAL HIGH (ref 11.6–15.2)

## 2013-02-20 LAB — PROTIME-INR
INR: 2.42 — AB (ref 0.00–1.49)
Prothrombin Time: 25.5 seconds — ABNORMAL HIGH (ref 11.6–15.2)

## 2013-02-20 LAB — BASIC METABOLIC PANEL
BUN: 10 mg/dL (ref 6–23)
CALCIUM: 8.2 mg/dL — AB (ref 8.4–10.5)
CHLORIDE: 93 meq/L — AB (ref 96–112)
CO2: 27 meq/L (ref 19–32)
Creatinine, Ser: 0.7 mg/dL (ref 0.50–1.10)
GFR calc Af Amer: >90 mL/min (ref >90)
GFR calc non Af Amer: 85 mL/min — ABNORMAL LOW (ref >90)
GLUCOSE: 143 mg/dL — AB (ref 70–99)
Potassium: 4.1 mEq/L (ref 3.7–5.3)
SODIUM: 131 meq/L — AB (ref 137–147)

## 2013-02-21 LAB — BASIC METABOLIC PANEL WITH GFR
BUN: 9 mg/dL (ref 6–23)
CO2: 28 meq/L (ref 19–32)
Calcium: 8.1 mg/dL — ABNORMAL LOW (ref 8.4–10.5)
Chloride: 94 meq/L — ABNORMAL LOW (ref 96–112)
Creatinine, Ser: 0.67 mg/dL (ref 0.50–1.10)
GFR calc Af Amer: >90 mL/min (ref >90)
GFR calc non Af Amer: 86 mL/min — ABNORMAL LOW (ref >90)
Glucose, Bld: 107 mg/dL — ABNORMAL HIGH (ref 70–99)
Potassium: 4.2 meq/L (ref 3.7–5.3)
Sodium: 133 meq/L — ABNORMAL LOW (ref 137–147)

## 2013-02-21 LAB — PROTIME-INR
INR: 2.16 — AB (ref 0.00–1.49)
Prothrombin Time: 23.4 seconds — ABNORMAL HIGH (ref 11.6–15.2)

## 2013-02-21 LAB — CBC
HCT: 27.7 % — ABNORMAL LOW (ref 36.0–46.0)
HEMOGLOBIN: 8.9 g/dL — AB (ref 12.0–15.0)
MCH: 30.3 pg (ref 26.0–34.0)
MCHC: 32.1 g/dL (ref 30.0–36.0)
MCV: 94.2 fL (ref 78.0–100.0)
Platelets: 264 10*3/uL (ref 150–400)
RBC: 2.94 MIL/uL — AB (ref 3.87–5.11)
RDW: 16.4 % — ABNORMAL HIGH (ref 11.5–15.5)
WBC: 5.5 10*3/uL (ref 4.0–10.5)

## 2013-02-22 LAB — BASIC METABOLIC PANEL
BUN: 8 mg/dL (ref 6–23)
CALCIUM: 8.1 mg/dL — AB (ref 8.4–10.5)
CO2: 29 meq/L (ref 19–32)
Chloride: 95 mEq/L — ABNORMAL LOW (ref 96–112)
Creatinine, Ser: 0.64 mg/dL (ref 0.50–1.10)
GFR calc Af Amer: >90 mL/min (ref >90)
GFR calc non Af Amer: 87 mL/min — ABNORMAL LOW (ref >90)
GLUCOSE: 90 mg/dL (ref 70–99)
Potassium: 4.1 mEq/L (ref 3.7–5.3)
Sodium: 135 mEq/L — ABNORMAL LOW (ref 137–147)

## 2013-02-22 LAB — PROTIME-INR
INR: 2.67 — AB (ref 0.00–1.49)
Prothrombin Time: 27.5 seconds — ABNORMAL HIGH (ref 11.6–15.2)

## 2013-02-23 LAB — BASIC METABOLIC PANEL
BUN: 9 mg/dL (ref 6–23)
CHLORIDE: 95 meq/L — AB (ref 96–112)
CO2: 29 meq/L (ref 19–32)
Calcium: 8.3 mg/dL — ABNORMAL LOW (ref 8.4–10.5)
Creatinine, Ser: 0.67 mg/dL (ref 0.50–1.10)
GFR calc Af Amer: >90 mL/min (ref >90)
GFR calc non Af Amer: 86 mL/min — ABNORMAL LOW (ref >90)
Glucose, Bld: 114 mg/dL — ABNORMAL HIGH (ref 70–99)
POTASSIUM: 4 meq/L (ref 3.7–5.3)
Sodium: 135 mEq/L — ABNORMAL LOW (ref 137–147)

## 2013-02-23 LAB — CBC WITH DIFFERENTIAL/PLATELET
BASOS ABS: 0 10*3/uL (ref 0.0–0.1)
Basophils Relative: 0 % (ref 0–1)
Eosinophils Absolute: 0.2 10*3/uL (ref 0.0–0.7)
Eosinophils Relative: 3 % (ref 0–5)
HCT: 28.3 % — ABNORMAL LOW (ref 36.0–46.0)
HEMOGLOBIN: 8.9 g/dL — AB (ref 12.0–15.0)
LYMPHS PCT: 27 % (ref 12–46)
Lymphs Abs: 1.4 10*3/uL (ref 0.7–4.0)
MCH: 29.8 pg (ref 26.0–34.0)
MCHC: 31.4 g/dL (ref 30.0–36.0)
MCV: 94.6 fL (ref 78.0–100.0)
MONOS PCT: 13 % — AB (ref 3–12)
Monocytes Absolute: 0.7 10*3/uL (ref 0.1–1.0)
NEUTROS ABS: 2.9 10*3/uL (ref 1.7–7.7)
NEUTROS PCT: 57 % (ref 43–77)
Platelets: 253 10*3/uL (ref 150–400)
RBC: 2.99 MIL/uL — ABNORMAL LOW (ref 3.87–5.11)
RDW: 16.7 % — ABNORMAL HIGH (ref 11.5–15.5)
WBC: 5.2 10*3/uL (ref 4.0–10.5)

## 2013-02-23 LAB — PROTIME-INR
INR: 2.74 — AB (ref 0.00–1.49)
PROTHROMBIN TIME: 28.1 s — AB (ref 11.6–15.2)

## 2013-02-23 LAB — PHOSPHORUS: Phosphorus: 4.3 mg/dL (ref 2.3–4.6)

## 2013-02-23 LAB — MAGNESIUM: MAGNESIUM: 1.5 mg/dL (ref 1.5–2.5)

## 2013-02-24 ENCOUNTER — Other Ambulatory Visit (HOSPITAL_COMMUNITY): Payer: Self-pay

## 2013-02-24 DIAGNOSIS — M7989 Other specified soft tissue disorders: Secondary | ICD-10-CM

## 2013-02-24 LAB — TSH: TSH: 2.069 u[IU]/mL (ref 0.350–4.500)

## 2013-02-24 LAB — PROTIME-INR
INR: 2.47 — AB (ref 0.00–1.49)
PROTHROMBIN TIME: 25.9 s — AB (ref 11.6–15.2)

## 2013-02-24 LAB — T4, FREE: Free T4: 1.11 ng/dL (ref 0.80–1.80)

## 2013-02-24 NOTE — Progress Notes (Signed)
VASCULAR LAB PRELIMINARY  PRELIMINARY  PRELIMINARY  PRELIMINARY  Right lower extremity venous Doppler completed.    Preliminary report:  There is no DVT or SVT noted in the right lower extremity.  Matson Welch, RVT 02/24/2013, 6:00 PM

## 2013-02-25 LAB — BASIC METABOLIC PANEL
BUN: 9 mg/dL (ref 6–23)
CO2: 31 meq/L (ref 19–32)
Calcium: 8.4 mg/dL (ref 8.4–10.5)
Chloride: 92 mEq/L — ABNORMAL LOW (ref 96–112)
Creatinine, Ser: 0.73 mg/dL (ref 0.50–1.10)
GFR calc Af Amer: >90 mL/min (ref >90)
GFR calc non Af Amer: 83 mL/min — ABNORMAL LOW (ref >90)
Glucose, Bld: 130 mg/dL — ABNORMAL HIGH (ref 70–99)
POTASSIUM: 4.5 meq/L (ref 3.7–5.3)
SODIUM: 133 meq/L — AB (ref 137–147)

## 2013-02-25 LAB — CBC
HCT: 30.4 % — ABNORMAL LOW (ref 36.0–46.0)
Hemoglobin: 9.3 g/dL — ABNORMAL LOW (ref 12.0–15.0)
MCH: 29.1 pg (ref 26.0–34.0)
MCHC: 30.6 g/dL (ref 30.0–36.0)
MCV: 95 fL (ref 78.0–100.0)
Platelets: 265 10*3/uL (ref 150–400)
RBC: 3.2 MIL/uL — AB (ref 3.87–5.11)
RDW: 16 % — ABNORMAL HIGH (ref 11.5–15.5)
WBC: 5 10*3/uL (ref 4.0–10.5)

## 2013-02-25 LAB — PROTIME-INR
INR: 3.25 — AB (ref 0.00–1.49)
PROTHROMBIN TIME: 32 s — AB (ref 11.6–15.2)

## 2013-02-26 ENCOUNTER — Other Ambulatory Visit (HOSPITAL_COMMUNITY): Payer: Self-pay

## 2013-02-26 ENCOUNTER — Encounter: Payer: Self-pay | Admitting: Nurse Practitioner

## 2013-02-26 DIAGNOSIS — K279 Peptic ulcer, site unspecified, unspecified as acute or chronic, without hemorrhage or perforation: Secondary | ICD-10-CM

## 2013-02-26 DIAGNOSIS — I255 Ischemic cardiomyopathy: Secondary | ICD-10-CM

## 2013-02-26 DIAGNOSIS — I2589 Other forms of chronic ischemic heart disease: Secondary | ICD-10-CM

## 2013-02-26 LAB — COMPREHENSIVE METABOLIC PANEL
ALBUMIN: 2.3 g/dL — AB (ref 3.5–5.2)
ALT: 17 U/L (ref 0–35)
AST: 35 U/L (ref 0–37)
Alkaline Phosphatase: 88 U/L (ref 39–117)
BILIRUBIN TOTAL: 1.3 mg/dL — AB (ref 0.3–1.2)
BUN: 10 mg/dL (ref 6–23)
CHLORIDE: 91 meq/L — AB (ref 96–112)
CO2: 28 meq/L (ref 19–32)
Calcium: 8.3 mg/dL — ABNORMAL LOW (ref 8.4–10.5)
Creatinine, Ser: 0.68 mg/dL (ref 0.50–1.10)
GFR calc Af Amer: >90 mL/min (ref >90)
GFR, EST NON AFRICAN AMERICAN: 85 mL/min — AB (ref >90)
Glucose, Bld: 112 mg/dL — ABNORMAL HIGH (ref 70–99)
Potassium: 4 mEq/L (ref 3.7–5.3)
SODIUM: 131 meq/L — AB (ref 137–147)
Total Protein: 6.7 g/dL (ref 6.0–8.3)

## 2013-02-26 LAB — PROTIME-INR
INR: 3.99 — AB (ref 0.00–1.49)
Prothrombin Time: 37.4 seconds — ABNORMAL HIGH (ref 11.6–15.2)

## 2013-02-26 LAB — PRO B NATRIURETIC PEPTIDE: Pro B Natriuretic peptide (BNP): 6010 pg/mL — ABNORMAL HIGH (ref 0–125)

## 2013-02-26 LAB — PREALBUMIN: Prealbumin: 7.3 mg/dL — ABNORMAL LOW (ref 17.0–34.0)

## 2013-02-26 NOTE — Progress Notes (Signed)
Patient Name: [name] Date of Encounter: 02/26/2013   Principal Problem:   Acute GI bleeding Active Problems:   Acute on chronic combined systolic and diastolic heart failure   Cardiomyopathy, ischemic   CAD, CABG 1991, redo 2006, S/P DES to Left Main 04/01/12   H/O mechanical aortic valve replacement, St Jude 1991   Chronic anticoagulation with Coumadin   Type II or unspecified type diabetes mellitus with peripheral circulatory disorders, uncontrolled(250.72)   Peptic ulcer disease   SUBJECTIVE  We have been asked to re-eval [name] in the setting of acute on chronic combined chf.  Briefly, she has a h/o CAD s/p CABG and redo CABG, s/p St Jude AVR on chronic coumadin, ICM w/ EF of 30-35%, and DM.  She is also s/p NSTEMI and VF/TDP arrest in the setting of QT prolongation and remeron/cymbalta therapy in 03/2012.  She was admitted to [facility] on 1/18 in the setting of acute GIB and shock req vasopressors, PRBC's, intubation/ventilation, and reversal of coumadin.  She was seen by GI and EGD showed a 3-69mm nonbleeding punctate ulcer in gastric antrum-->PPI.  Following extubation and stabilization, coumadin was resumed.  She was transferred to [facility] on 1/23.  Since arriving @ [facility], she has made little progress per her family.  She has been weak with doe and over the past 2 wks has had a progressive cough along with bilat LEE and increasing abd girth.  She has not been weighed though a note from nutritional services on 2/2 indicates that she was up 16.1 lbs from admission at that time.  Pt denies c/p or dyspnea at rest.  She chronically sleeps in a recliner.  She has had some early satiety.  CURRENT MEDS  novolog SSI ACHS lipitor 40 qd Biotene Clotrimazole 1% cream Doxycycline 100 cymbalta 30 bid Fentanyl patcy 25 mcg q72h florastor 250 bid Lasix 40 bid (increased today) levemir 28u bid Melatonin 3mg  qhs Lopressor 12.5 bid mucinex 600 tid protonix 40 bid Sucralfate 1g qid thera m  plus 1 daily  OBJECTIVE  96.5, 98, 18, 118/60, 99% ra No intake or output data in the 24 hours ending 02/26/13 1428  PHYSICAL EXAM  General: Pleasant, NAD. Neuro: Alert and oriented X 3. Moves all extremities spontaneously. Psych: Normal affect. HEENT:  Normal  Neck: Supple without bruits.  JVP to jaw. Lungs:  Resp regular and unlabored, diminished breath sounds r base, bibasilar crackles 1/3 way up. Heart: distant, irreg. Abdomen: Soft, non-tender, non-distended, BS + x 4.  Extremities: No clubbing, cyanosis.  2+ bilat LE edema. DP/PT/Radials 1+ and equal bilaterally.  Accessory Clinical Findings  CBC  Recent Labs  02/25/13 0744  WBC 5.0  HGB 9.3*  HCT 30.4*  MCV 95.0  PLT 265   Basic Metabolic Panel  Recent Labs  02/25/13 0744 02/26/13 0753  NA 133* 131*  K 4.5 4.0  CL 92* 91*  CO2 31 28  GLUCOSE 130* 112*  BUN 9 10  CREATININE 0.73 0.68  CALCIUM 8.4 8.3*   Liver Function Tests  Recent Labs  02/26/13 0753  AST 35  ALT 17  ALKPHOS 88  BILITOT 1.3*  PROT 6.7  ALBUMIN 2.3*   Thyroid Function Tests  Recent Labs  02/24/13 0543  TSH 2.069   Lab Results  Component Value Date   INR 3.99* 02/26/2013   INR 3.25* 02/25/2013   INR 2.47* 02/24/2013    TELE  Not on tele.  Radiology/Studies  Ct Head Wo Contrast  02/26/2013   CLINICAL DATA:  Memory loss  EXAM: CT HEAD WITHOUT CONTRAST  TECHNIQUE: Contiguous axial images were obtained from the base of the skull through the vertex without intravenous contrast.  COMPARISON:  September 08, 2012  FINDINGS: There is age related volume loss. Prominence of the cisterna magna is an anatomic variant.  There is no mass, hemorrhage, extra-axial fluid collection, or midline shift. Gray-white compartments appear normal. Bony calvarium appears intact. The mastoid air cells are clear. Mild basal ganglia calcification is felt to be physiologic.  IMPRESSION: Age related volume loss. No intracranial mass, hemorrhage, or acute  appearing infarct.   Electronically Signed   By: Bretta Bang M.D.   On: 02/26/2013 13:59   Dg Chest Port 1 View  02/26/2013   CLINICAL DATA:  Cough  EXAM: PORTABLE CHEST - 1 VIEW  COMPARISON:  02/24/2013  FINDINGS: Cardiomediastinal silhouette is stable. Again noted status post CABG. Chronic interstitial prominence again noted. Persistent small left pleural effusion with left basilar atelectasis or infiltrate. No pulmonary edema.  IMPRESSION: Chronic interstitial prominence again noted. Persistent small left pleural effusion with left basilar atelectasis or infiltrate. No pulmonary edema.   Electronically Signed   By: Natasha Mead M.D.   On: 02/26/2013 10:35   Dg Chest Port 1 View  02/24/2013   CLINICAL DATA:  Cough  EXAM: PORTABLE CHEST - 1 VIEW  COMPARISON:  02/10/2013  FINDINGS: Cardiac shadow is within normal limits. Postsurgical changes are again seen. The previously seen right jugular line is been removed in the interval. Patchy interstitial changes are noted. A small left-sided pleural effusion is seen.  IMPRESSION: Small left effusion.  Chronic interstitial changes bilaterally.   Electronically Signed   By: Alcide Clever M.D.   On: 02/24/2013 12:10   2D Echocardiogram 1.20.2015  Study Conclusions  - Left ventricle: The cavity size was normal. There was moderate concentric hypertrophy. Systolic function was moderately to severely reduced. The estimated ejection fraction was in the range of 30% to 35%. There is severe hypokinesis of the mid-distalanteroseptal myocardium. Doppler parameters are consistent with restrictive physiology, indicative of decreased left ventricular diastolic compliance and/or increased left atrial pressure. - Aortic valve: There was mild stenosis. Valve area: 0.78cm^2(VTI). Valve area: 0.82cm^2 (Vmax). - Mitral valve: Calcified annulus. Mildly thickened leaflets . The findings are consistent with trivial stenosis. Mild regurgitation. Valve area by continuity  equation (using LVOT flow): 0.96cm^2. - Left atrium: The atrium was moderately dilated. - Right atrium: The atrium was mildly dilated. _____________   ASSESSMENT AND PLAN  1.  Acute on chronic combined systolic/diastolic chf/ICM:  We have been asked to see pt in setting of marked volume overload.  ProBNP is in the 60K range.  Rec IV access, lasix 80 IV bid, daily weights and strict I/O's.  Cont bb.  She is not currently on an acei/arb likely 2/2 hypotn while hospitalized.  She was prev on losartan 100 daily.  Follow BP with diuresis and consider resuming a lower dose of ARB within the next few days if BP/renal fxn stable.  2.  S/P GIB:  H/H stable.  INR supratherapeutic today.  Cont PPI.  3.  S/P AVR:  On coumadin.  As above, INR 3.99.  4.  CAD:  No chest pain.  S/p CABG, redo CABG and LM stenting in 03/2012.  Was on plavix prior to admission but this was d/c'd in the setting of GIB and has yet to be resumed.  She is still less than 1  yr out from DES to LM.  GI was ok with resumption of anticoag as of 1/21.  If H/H remains stable, would add back plavix.  Cont bb/statin.  Signed, Nicolasa Ducking NP Patient seen on 5700 with Nicolasa Ducking, NP. She is severely volume overloaded. Hopefully she will respond well to IV lasix. Her renal function is satisfactory. Agree with assessment and plan as noted above. Agree with resumption of Plavix for her DES.

## 2013-02-27 DIAGNOSIS — I509 Heart failure, unspecified: Secondary | ICD-10-CM

## 2013-02-27 LAB — CBC
HCT: 30.6 % — ABNORMAL LOW (ref 36.0–46.0)
HEMOGLOBIN: 9.4 g/dL — AB (ref 12.0–15.0)
MCH: 28.8 pg (ref 26.0–34.0)
MCHC: 30.7 g/dL (ref 30.0–36.0)
MCV: 93.9 fL (ref 78.0–100.0)
Platelets: 299 10*3/uL (ref 150–400)
RBC: 3.26 MIL/uL — AB (ref 3.87–5.11)
RDW: 15.7 % — ABNORMAL HIGH (ref 11.5–15.5)
WBC: 4.9 10*3/uL (ref 4.0–10.5)

## 2013-02-27 LAB — BASIC METABOLIC PANEL
BUN: 10 mg/dL (ref 6–23)
CHLORIDE: 95 meq/L — AB (ref 96–112)
CO2: 29 mEq/L (ref 19–32)
Calcium: 8.4 mg/dL (ref 8.4–10.5)
Creatinine, Ser: 0.75 mg/dL (ref 0.50–1.10)
GFR calc Af Amer: >90 mL/min (ref >90)
GFR calc non Af Amer: 83 mL/min — ABNORMAL LOW (ref >90)
GLUCOSE: 123 mg/dL — AB (ref 70–99)
POTASSIUM: 4.4 meq/L (ref 3.7–5.3)
SODIUM: 136 meq/L — AB (ref 137–147)

## 2013-02-27 LAB — URINALYSIS, ROUTINE W REFLEX MICROSCOPIC
Bilirubin Urine: NEGATIVE
GLUCOSE, UA: NEGATIVE mg/dL
HGB URINE DIPSTICK: NEGATIVE
Ketones, ur: NEGATIVE mg/dL
LEUKOCYTES UA: NEGATIVE
Nitrite: NEGATIVE
PH: 6 (ref 5.0–8.0)
PROTEIN: NEGATIVE mg/dL
Specific Gravity, Urine: 1.008 (ref 1.005–1.030)
Urobilinogen, UA: 0.2 mg/dL (ref 0.0–1.0)

## 2013-02-27 LAB — PROTIME-INR
INR: 2.9 — ABNORMAL HIGH (ref 0.00–1.49)
Prothrombin Time: 29.3 seconds — ABNORMAL HIGH (ref 11.6–15.2)

## 2013-02-28 ENCOUNTER — Other Ambulatory Visit (HOSPITAL_COMMUNITY): Payer: Self-pay

## 2013-02-28 DIAGNOSIS — I509 Heart failure, unspecified: Secondary | ICD-10-CM

## 2013-02-28 LAB — PROTIME-INR
INR: 2.24 — AB (ref 0.00–1.49)
Prothrombin Time: 24.1 seconds — ABNORMAL HIGH (ref 11.6–15.2)

## 2013-02-28 LAB — BASIC METABOLIC PANEL
BUN: 8 mg/dL (ref 6–23)
CALCIUM: 8.4 mg/dL (ref 8.4–10.5)
CHLORIDE: 89 meq/L — AB (ref 96–112)
CO2: 32 mEq/L (ref 19–32)
Creatinine, Ser: 0.85 mg/dL (ref 0.50–1.10)
GFR calc Af Amer: 77 mL/min — ABNORMAL LOW (ref >90)
GFR calc non Af Amer: 67 mL/min — ABNORMAL LOW (ref >90)
GLUCOSE: 90 mg/dL (ref 70–99)
Potassium: 3.8 mEq/L (ref 3.7–5.3)
Sodium: 134 mEq/L — ABNORMAL LOW (ref 137–147)

## 2013-02-28 LAB — URINE CULTURE
COLONY COUNT: NO GROWTH
CULTURE: NO GROWTH

## 2013-03-01 LAB — CBC WITH DIFFERENTIAL/PLATELET
BASOS PCT: 0 % (ref 0–1)
Basophils Absolute: 0 10*3/uL (ref 0.0–0.1)
EOS PCT: 0 % (ref 0–5)
Eosinophils Absolute: 0 10*3/uL (ref 0.0–0.7)
HEMATOCRIT: 30.4 % — AB (ref 36.0–46.0)
HEMOGLOBIN: 9.4 g/dL — AB (ref 12.0–15.0)
LYMPHS PCT: 18 % (ref 12–46)
Lymphs Abs: 0.6 10*3/uL — ABNORMAL LOW (ref 0.7–4.0)
MCH: 28.7 pg (ref 26.0–34.0)
MCHC: 30.9 g/dL (ref 30.0–36.0)
MCV: 92.7 fL (ref 78.0–100.0)
MONO ABS: 0.2 10*3/uL (ref 0.1–1.0)
Monocytes Relative: 6 % (ref 3–12)
Neutro Abs: 2.7 10*3/uL (ref 1.7–7.7)
Neutrophils Relative %: 76 % (ref 43–77)
Platelets: 337 10*3/uL (ref 150–400)
RBC: 3.28 MIL/uL — ABNORMAL LOW (ref 3.87–5.11)
RDW: 15.6 % — AB (ref 11.5–15.5)
WBC: 3.5 10*3/uL — ABNORMAL LOW (ref 4.0–10.5)

## 2013-03-01 LAB — PROTIME-INR
INR: 1.8 — ABNORMAL HIGH (ref 0.00–1.49)
INR: 1.86 — ABNORMAL HIGH (ref 0.00–1.49)
PROTHROMBIN TIME: 20.4 s — AB (ref 11.6–15.2)
Prothrombin Time: 20.9 seconds — ABNORMAL HIGH (ref 11.6–15.2)

## 2013-03-01 LAB — BASIC METABOLIC PANEL
BUN: 13 mg/dL (ref 6–23)
CO2: 33 meq/L — AB (ref 19–32)
CREATININE: 0.72 mg/dL (ref 0.50–1.10)
Calcium: 8.3 mg/dL — ABNORMAL LOW (ref 8.4–10.5)
Chloride: 89 mEq/L — ABNORMAL LOW (ref 96–112)
GFR calc non Af Amer: 84 mL/min — ABNORMAL LOW (ref >90)
GLUCOSE: 234 mg/dL — AB (ref 70–99)
Potassium: 4.8 mEq/L (ref 3.7–5.3)
Sodium: 132 mEq/L — ABNORMAL LOW (ref 137–147)

## 2013-03-01 LAB — PRO B NATRIURETIC PEPTIDE: Pro B Natriuretic peptide (BNP): 5467 pg/mL — ABNORMAL HIGH (ref 0–125)

## 2013-03-02 ENCOUNTER — Other Ambulatory Visit (HOSPITAL_COMMUNITY): Payer: Self-pay

## 2013-03-02 DIAGNOSIS — I509 Heart failure, unspecified: Secondary | ICD-10-CM

## 2013-03-02 LAB — BASIC METABOLIC PANEL
BUN: 24 mg/dL — AB (ref 6–23)
CHLORIDE: 88 meq/L — AB (ref 96–112)
CO2: 30 meq/L (ref 19–32)
Calcium: 8.3 mg/dL — ABNORMAL LOW (ref 8.4–10.5)
Creatinine, Ser: 0.93 mg/dL (ref 0.50–1.10)
GFR calc Af Amer: 69 mL/min — ABNORMAL LOW (ref >90)
GFR, EST NON AFRICAN AMERICAN: 60 mL/min — AB (ref >90)
GLUCOSE: 220 mg/dL — AB (ref 70–99)
Potassium: 4.8 mEq/L (ref 3.7–5.3)
SODIUM: 128 meq/L — AB (ref 137–147)

## 2013-03-02 LAB — CBC WITH DIFFERENTIAL/PLATELET
BASOS PCT: 0 % (ref 0–1)
Basophils Absolute: 0 10*3/uL (ref 0.0–0.1)
EOS ABS: 0 10*3/uL (ref 0.0–0.7)
Eosinophils Relative: 0 % (ref 0–5)
HEMATOCRIT: 31.1 % — AB (ref 36.0–46.0)
HEMOGLOBIN: 9.5 g/dL — AB (ref 12.0–15.0)
LYMPHS ABS: 1 10*3/uL (ref 0.7–4.0)
Lymphocytes Relative: 20 % (ref 12–46)
MCH: 28.4 pg (ref 26.0–34.0)
MCHC: 30.5 g/dL (ref 30.0–36.0)
MCV: 92.8 fL (ref 78.0–100.0)
MONO ABS: 0.6 10*3/uL (ref 0.1–1.0)
MONOS PCT: 11 % (ref 3–12)
NEUTROS PCT: 69 % (ref 43–77)
Neutro Abs: 3.5 10*3/uL (ref 1.7–7.7)
Platelets: 324 10*3/uL (ref 150–400)
RBC: 3.35 MIL/uL — AB (ref 3.87–5.11)
RDW: 15.6 % — ABNORMAL HIGH (ref 11.5–15.5)
WBC: 5.2 10*3/uL (ref 4.0–10.5)

## 2013-03-02 LAB — PROTIME-INR
INR: 1.91 — ABNORMAL HIGH (ref 0.00–1.49)
Prothrombin Time: 21.3 seconds — ABNORMAL HIGH (ref 11.6–15.2)

## 2013-03-03 DIAGNOSIS — I5043 Acute on chronic combined systolic (congestive) and diastolic (congestive) heart failure: Secondary | ICD-10-CM

## 2013-03-03 DIAGNOSIS — I2510 Atherosclerotic heart disease of native coronary artery without angina pectoris: Secondary | ICD-10-CM

## 2013-03-03 DIAGNOSIS — Z7901 Long term (current) use of anticoagulants: Secondary | ICD-10-CM

## 2013-03-03 LAB — BASIC METABOLIC PANEL
BUN: 29 mg/dL — ABNORMAL HIGH (ref 6–23)
CO2: 34 meq/L — AB (ref 19–32)
CREATININE: 0.85 mg/dL (ref 0.50–1.10)
Calcium: 8.8 mg/dL (ref 8.4–10.5)
Chloride: 89 mEq/L — ABNORMAL LOW (ref 96–112)
GFR calc Af Amer: 77 mL/min — ABNORMAL LOW (ref >90)
GFR calc non Af Amer: 67 mL/min — ABNORMAL LOW (ref >90)
GLUCOSE: 283 mg/dL — AB (ref 70–99)
Potassium: 5.3 mEq/L (ref 3.7–5.3)
SODIUM: 132 meq/L — AB (ref 137–147)

## 2013-03-03 LAB — CBC WITH DIFFERENTIAL/PLATELET
Basophils Absolute: 0 10*3/uL (ref 0.0–0.1)
Basophils Relative: 0 % (ref 0–1)
Eosinophils Absolute: 0 10*3/uL (ref 0.0–0.7)
Eosinophils Relative: 0 % (ref 0–5)
HCT: 33.8 % — ABNORMAL LOW (ref 36.0–46.0)
Hemoglobin: 10.4 g/dL — ABNORMAL LOW (ref 12.0–15.0)
LYMPHS ABS: 1.7 10*3/uL (ref 0.7–4.0)
LYMPHS PCT: 22 % (ref 12–46)
MCH: 28.9 pg (ref 26.0–34.0)
MCHC: 30.8 g/dL (ref 30.0–36.0)
MCV: 93.9 fL (ref 78.0–100.0)
Monocytes Absolute: 1.1 10*3/uL — ABNORMAL HIGH (ref 0.1–1.0)
Monocytes Relative: 14 % — ABNORMAL HIGH (ref 3–12)
Neutro Abs: 5.1 10*3/uL (ref 1.7–7.7)
Neutrophils Relative %: 64 % (ref 43–77)
Platelets: 401 10*3/uL — ABNORMAL HIGH (ref 150–400)
RBC: 3.6 MIL/uL — AB (ref 3.87–5.11)
RDW: 15.5 % (ref 11.5–15.5)
WBC: 8 10*3/uL (ref 4.0–10.5)

## 2013-03-03 LAB — PROTIME-INR
INR: 2.14 — ABNORMAL HIGH (ref 0.00–1.49)
Prothrombin Time: 23.2 seconds — ABNORMAL HIGH (ref 11.6–15.2)

## 2013-03-04 LAB — BASIC METABOLIC PANEL
BUN: 22 mg/dL (ref 6–23)
CALCIUM: 8.5 mg/dL (ref 8.4–10.5)
CO2: 35 mEq/L — ABNORMAL HIGH (ref 19–32)
CREATININE: 0.77 mg/dL (ref 0.50–1.10)
Chloride: 92 mEq/L — ABNORMAL LOW (ref 96–112)
GFR, EST NON AFRICAN AMERICAN: 82 mL/min — AB (ref >90)
Glucose, Bld: 88 mg/dL (ref 70–99)
Potassium: 3.8 mEq/L (ref 3.7–5.3)
SODIUM: 139 meq/L (ref 137–147)

## 2013-03-04 LAB — PHOSPHORUS: PHOSPHORUS: 3.5 mg/dL (ref 2.3–4.6)

## 2013-03-04 LAB — PROTIME-INR
INR: 2.55 — ABNORMAL HIGH (ref 0.00–1.49)
Prothrombin Time: 26.6 s — ABNORMAL HIGH (ref 11.6–15.2)

## 2013-03-04 LAB — MAGNESIUM: Magnesium: 2 mg/dL (ref 1.5–2.5)

## 2013-03-05 ENCOUNTER — Encounter: Payer: Self-pay | Admitting: Certified Registered Nurse Anesthetist

## 2013-03-05 MED FILL — Medication: Qty: 1 | Status: AC

## 2013-03-16 ENCOUNTER — Ambulatory Visit: Payer: Medicare Other

## 2013-03-18 DEATH — deceased

## 2013-03-28 ENCOUNTER — Ambulatory Visit: Payer: Medicare Other | Admitting: Neurology

## 2013-12-27 ENCOUNTER — Encounter (HOSPITAL_COMMUNITY): Payer: Self-pay | Admitting: Interventional Cardiology

## 2014-12-29 IMAGING — CT CT CHEST W/O CM
2 of 4 series · 15 of 36 positions shown, 18 images · non-contrast
Comparison: None.

CLINICAL DATA: Persistent right chest pain.  Recent cardiopulmonary
resuscitation.

CT CHEST WITHOUT CONTRAST
TECHNIQUE: Multidetector CT imaging of the chest was performed
following the standard protocol without IV contrast.

[Series 2: routine chest 5.0 st · axial · 0.76mm/px · z∈[-318,-68]mm · 12 of 60 slices shown, 15 images]
[im 5/60  mediastinal]
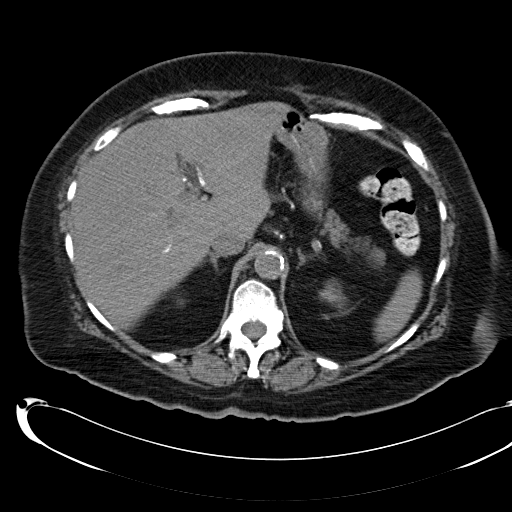
[im 5/60  lung]
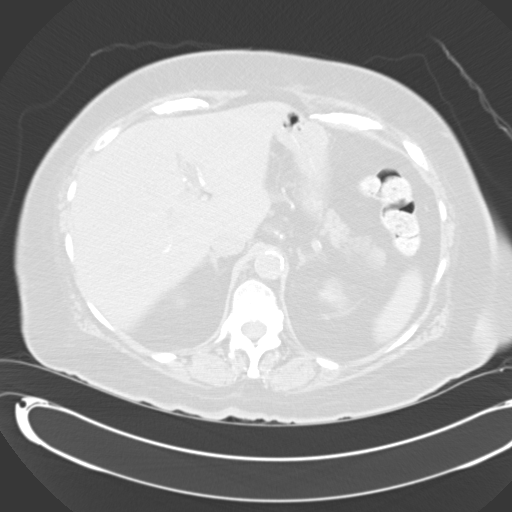
[im 9/60  lung]
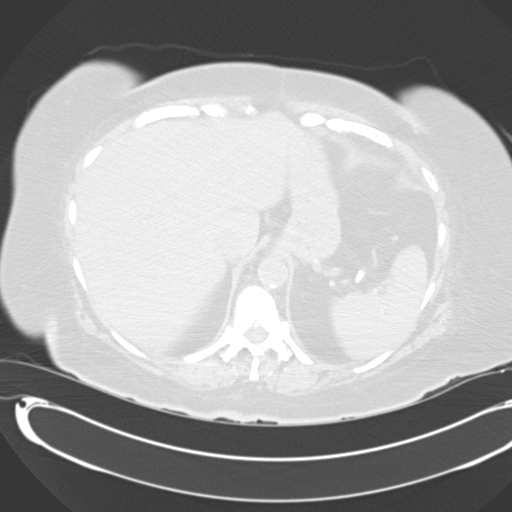
[im 13/60  lung]
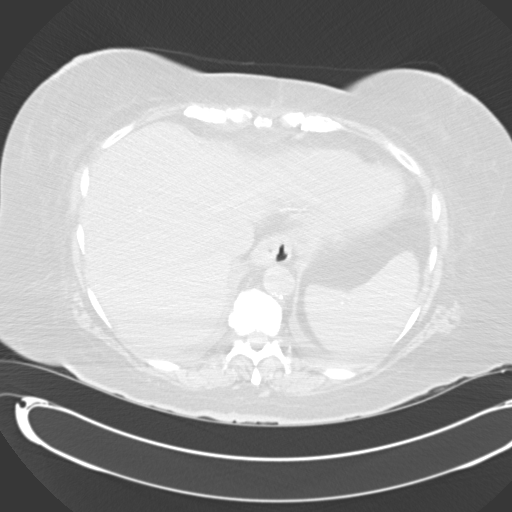
[im 17/60  lung]
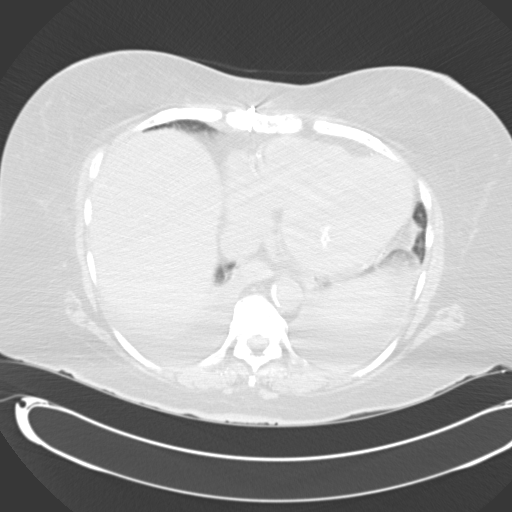
[im 22/60  mediastinal]
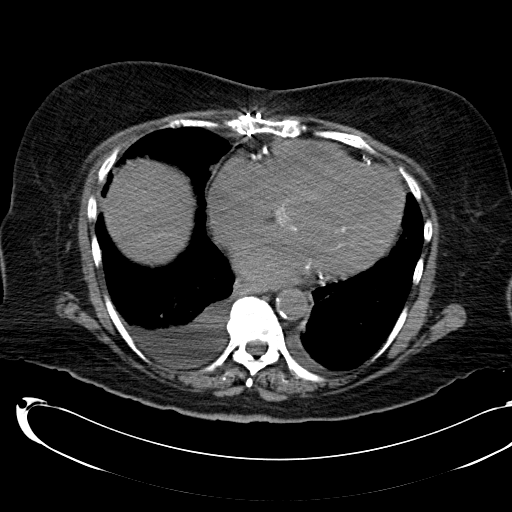
[im 22/60  lung]
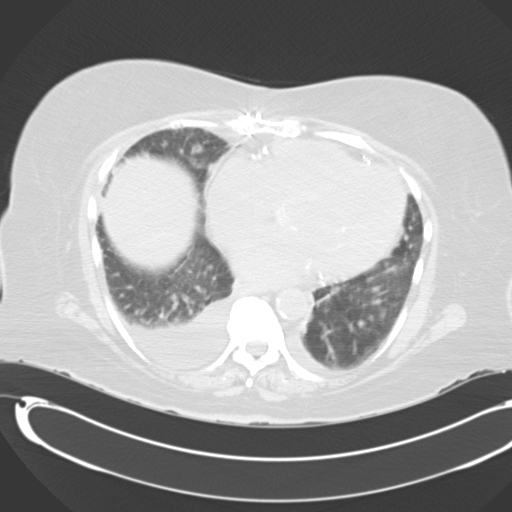
[im 26/60  lung]
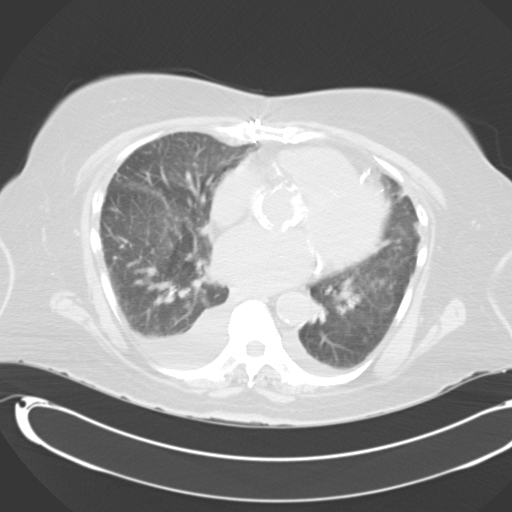
[im 34/60  lung]
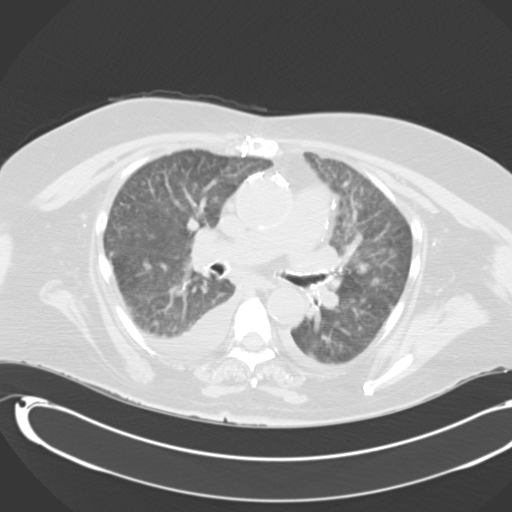
[im 38/60  lung]
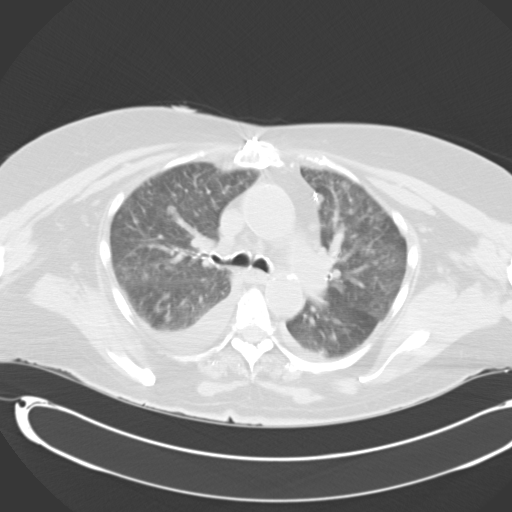
[im 43/60  mediastinal]
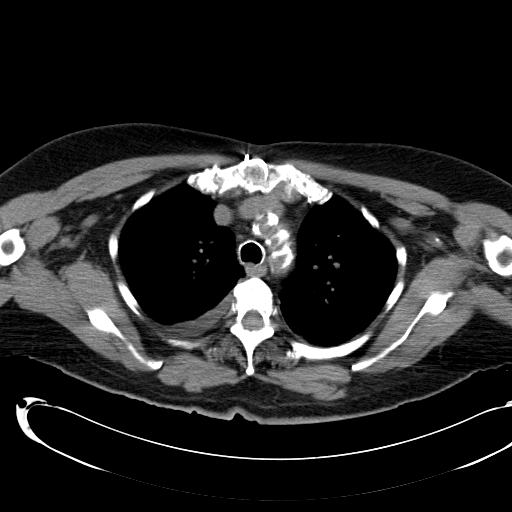
[im 43/60  lung]
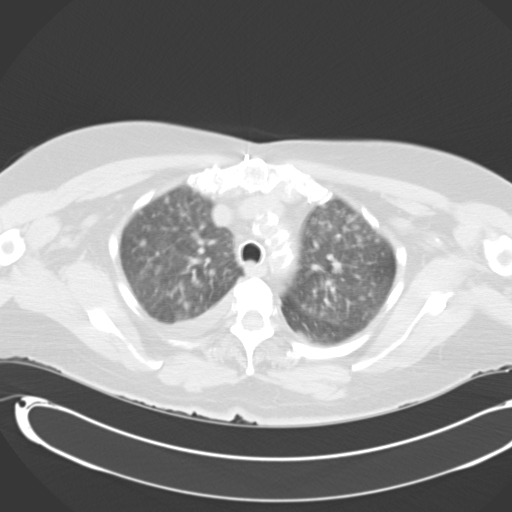
[im 47/60  lung]
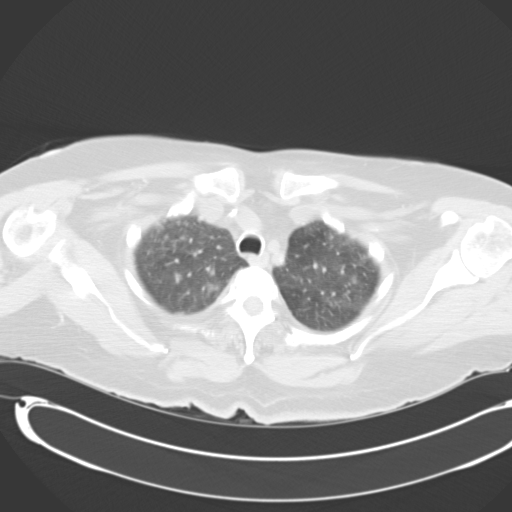
[im 51/60  lung]
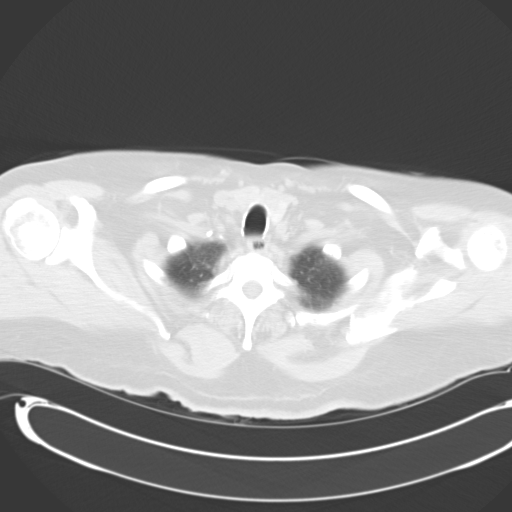
[im 55/60  lung]
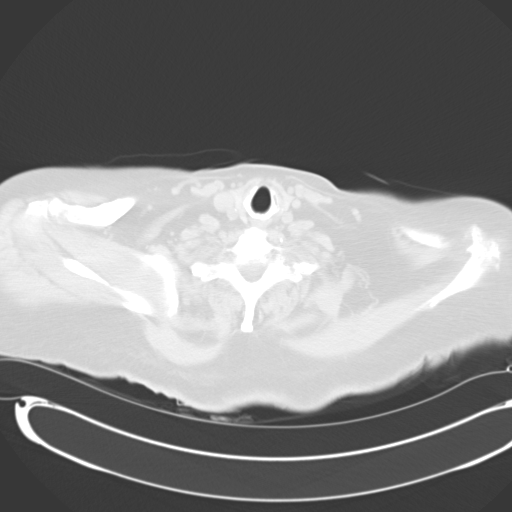

[Series 5: coronals · coronal · 0.77mm/px · 3 of 118 slices shown]
[im 24/118  lung]
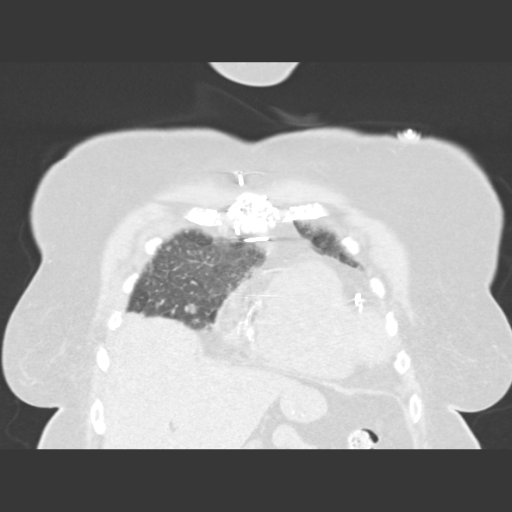
[im 47/118  lung]
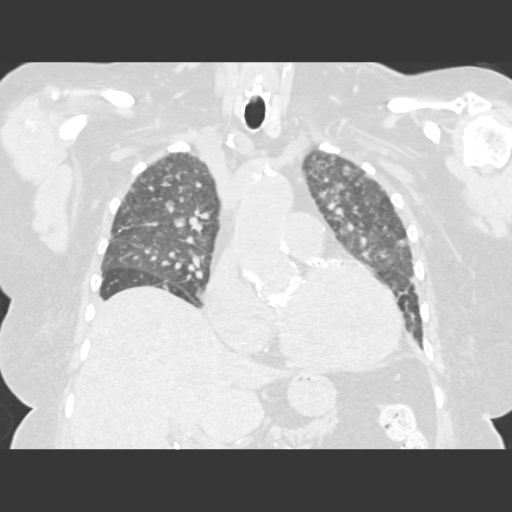
[im 71/118  lung]
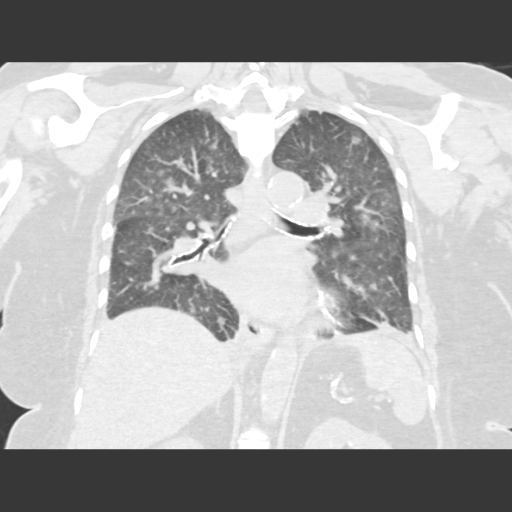

[15 of 36 positions shown; findings below may reference images not displayed]

FINDINGS: Small pleural effusions are seen bilaterally.  Mild
diffuse granular pulmonary opacity is suspicious for mild pulmonary
edema.  In addition, there are numerous small less than 1 cm
pulmonary nodules bilaterally, and differential diagnosis includes
pulmonary metastases or other infectious or inflammatory
etiologies.

No lymphadenopathy identified within the thorax.  Mild to moderate
cardiomegaly is seen as well as diffuse coronary artery
calcification.  Mild ascending thoracic aortic aneurysm is seen
measuring 4.1 cm.  No evidence of aneurysm leak or rupture.  No
suspicious bone lesions or fractures are identified.  Prior CABG
noted.
IMPRESSION: 1.
1.  Small bilateral pleural effusions and probable mild diffuse
pulmonary edema.
2.  Numerous diffuse tiny less than 1 cm pulmonary nodules.
Differential diagnosis includes metastatic disease as well as other
infectious or inflammatory etiologies.
3.  No evidence of lymphadenopathy.
4.  4.1 cm ascending thoracic aortic aneurysm.  No evidence of
aneurysm leak or rupture.
5.  No definite rib fractures or other suspicious bone lesions
identified.

## 2015-10-10 IMAGING — CR DG CHEST 2V
2 series · 2 of 2 positions shown · non-contrast
Comparison: 04/03/2012

CLINICAL DATA: Preop chest x-ray

EXAM:
CHEST  2 VIEW

[w chest pa]
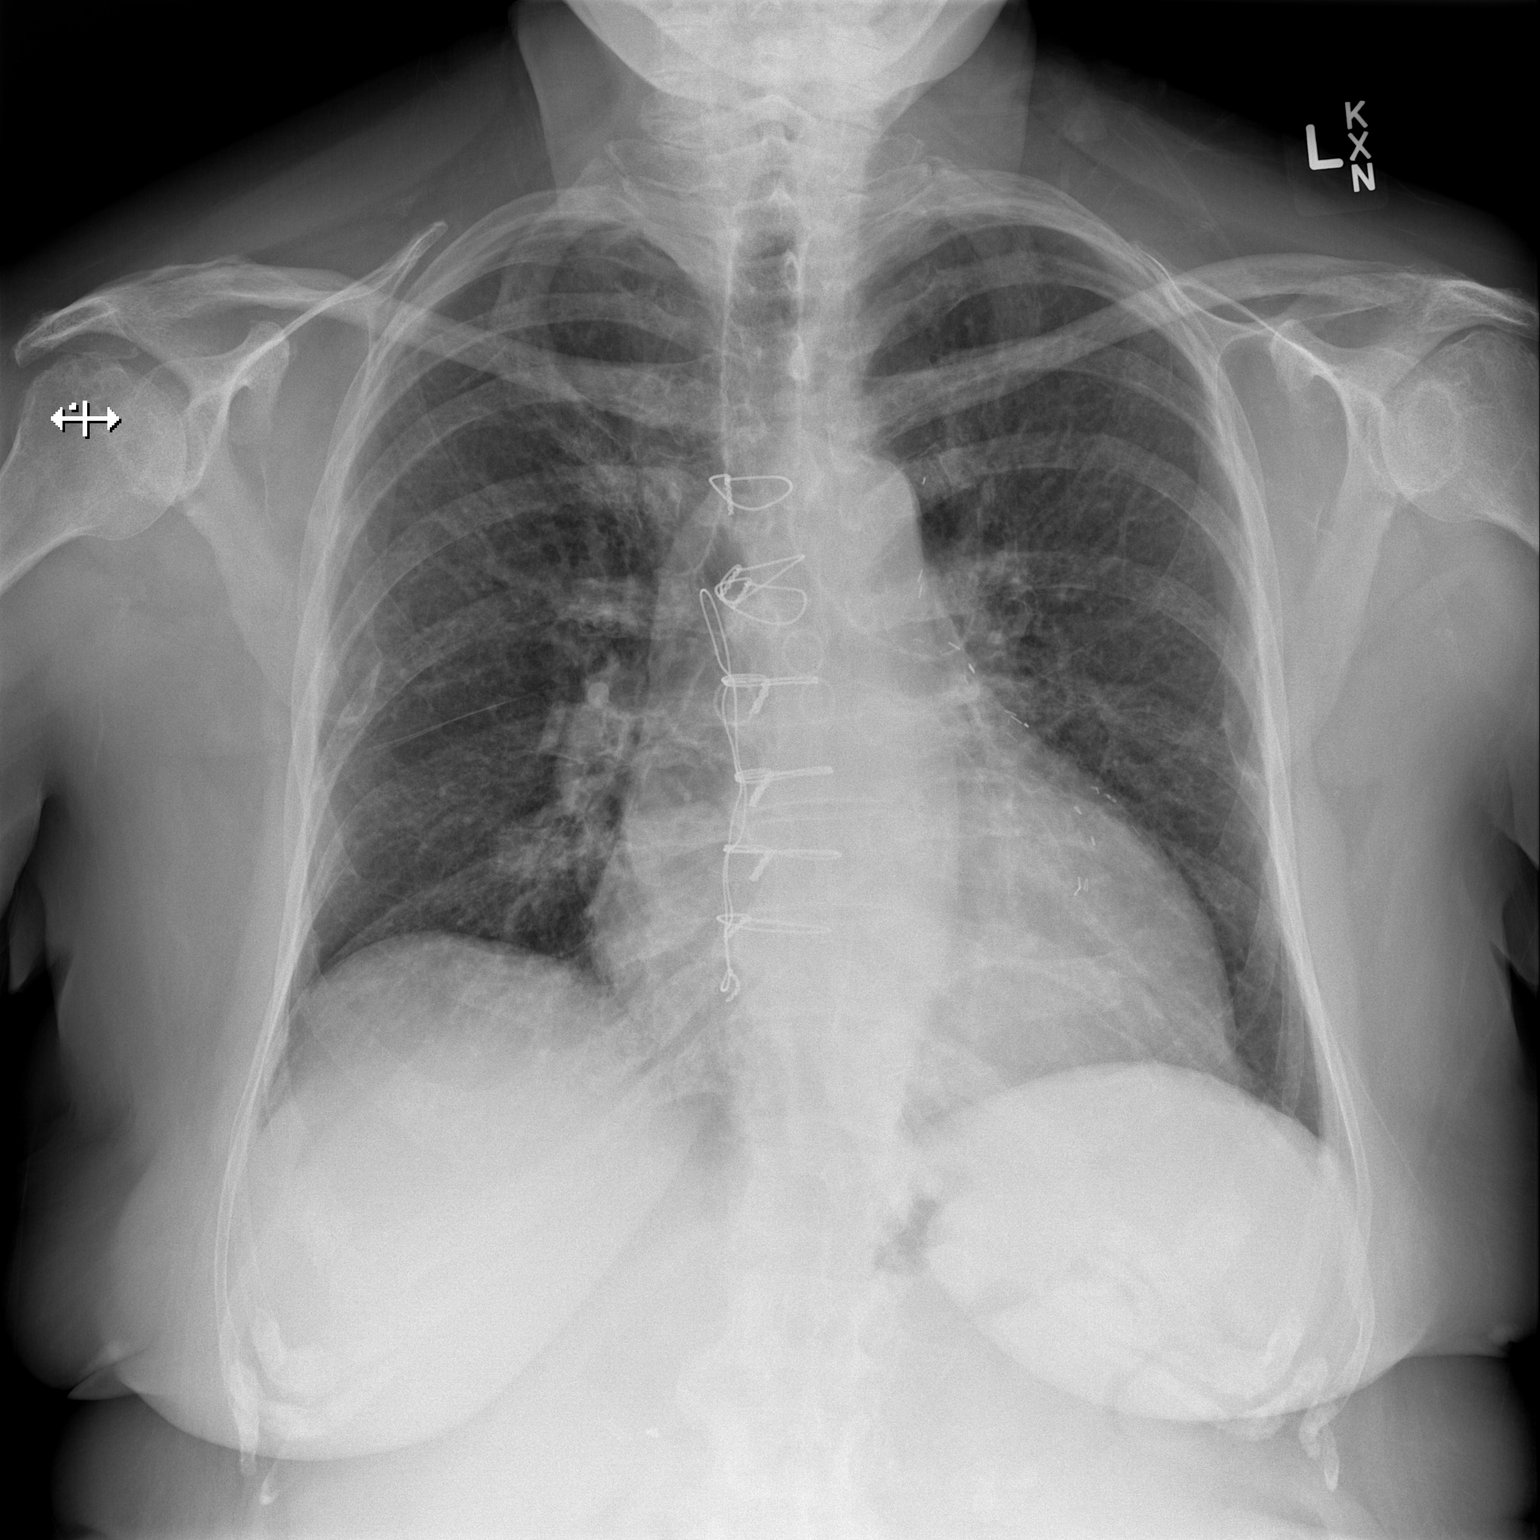

[w chest lat]
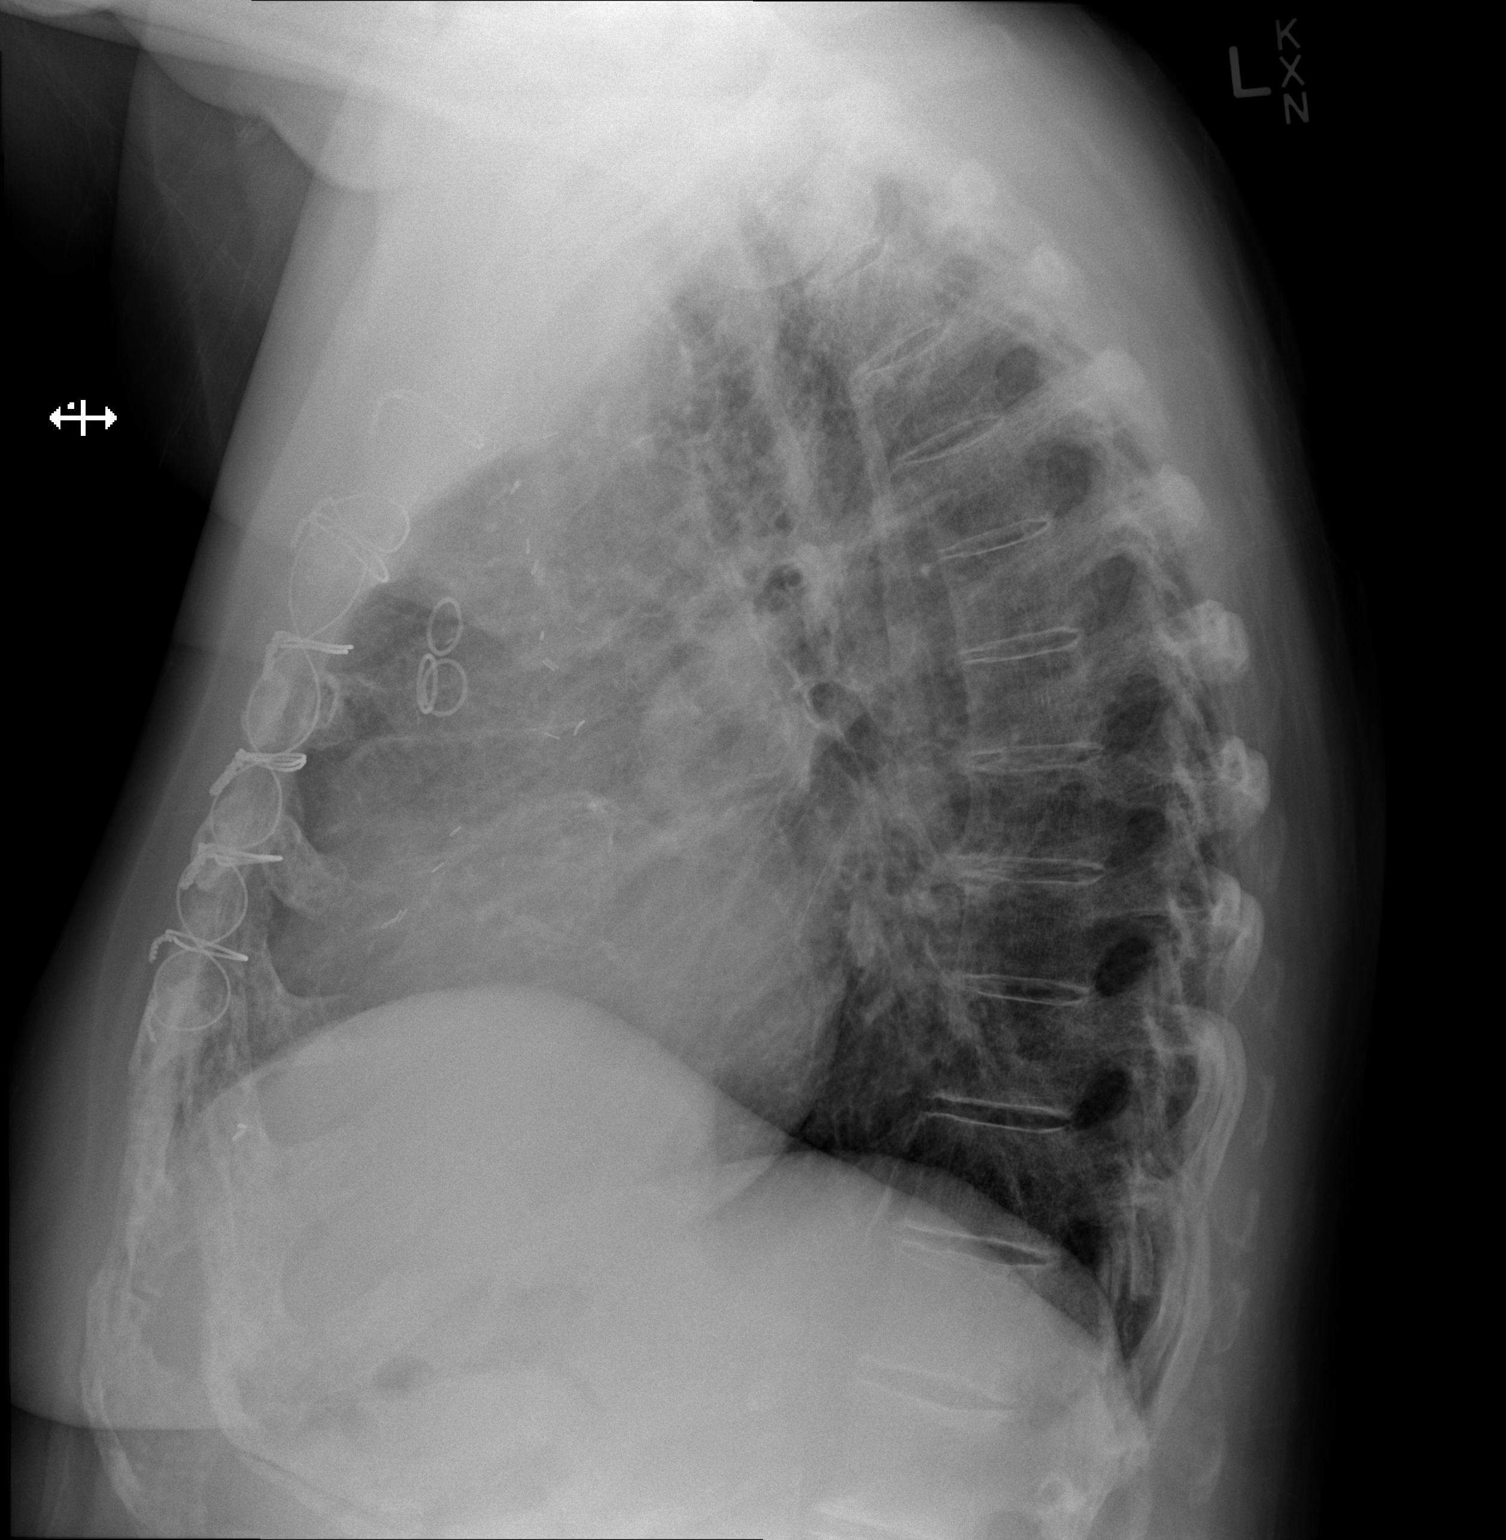

[2 of 2 positions shown; findings below may reference images not displayed]

FINDINGS: Bilateral mild interstitial thickening likely chronic. There is no
focal parenchymal opacity, pleural effusion, or pneumothorax. Mild
stable cardiomegaly. Prior CABG.

The osseous structures are unremarkable.
IMPRESSION: No active cardiopulmonary disease.
